# Patient Record
Sex: Female | Born: 1985
Health system: Southern US, Community
[De-identification: ages and names within clinical notes are randomized; demographics above are authoritative.]

## PROBLEM LIST (undated history)

## (undated) ENCOUNTER — Emergency Department (HOSPITAL_COMMUNITY): Payer: BC Managed Care – PPO

## (undated) DIAGNOSIS — R51 Headache: Secondary | ICD-10-CM

## (undated) DIAGNOSIS — I2699 Other pulmonary embolism without acute cor pulmonale: Secondary | ICD-10-CM

## (undated) DIAGNOSIS — G43909 Migraine, unspecified, not intractable, without status migrainosus: Secondary | ICD-10-CM

## (undated) DIAGNOSIS — IMO0002 Reserved for concepts with insufficient information to code with codable children: Secondary | ICD-10-CM

## (undated) DIAGNOSIS — E785 Hyperlipidemia, unspecified: Secondary | ICD-10-CM

## (undated) DIAGNOSIS — I82409 Acute embolism and thrombosis of unspecified deep veins of unspecified lower extremity: Secondary | ICD-10-CM

## (undated) DIAGNOSIS — F32A Depression, unspecified: Secondary | ICD-10-CM

## (undated) DIAGNOSIS — R519 Headache, unspecified: Secondary | ICD-10-CM

## (undated) DIAGNOSIS — F419 Anxiety disorder, unspecified: Secondary | ICD-10-CM

## (undated) DIAGNOSIS — K219 Gastro-esophageal reflux disease without esophagitis: Secondary | ICD-10-CM

## (undated) DIAGNOSIS — F329 Major depressive disorder, single episode, unspecified: Secondary | ICD-10-CM

## (undated) HISTORY — DX: Migraine, unspecified, not intractable, without status migrainosus: G43.909

## (undated) HISTORY — DX: Hyperlipidemia, unspecified: E78.5

## (undated) HISTORY — DX: Major depressive disorder, single episode, unspecified: F32.9

## (undated) HISTORY — DX: Reserved for concepts with insufficient information to code with codable children: IMO0002

## (undated) HISTORY — PX: OTHER SURGICAL HISTORY: SHX169

## (undated) HISTORY — DX: Gastro-esophageal reflux disease without esophagitis: K21.9

## (undated) HISTORY — DX: Anxiety disorder, unspecified: F41.9

## (undated) HISTORY — DX: Depression, unspecified: F32.A

## (undated) HISTORY — PX: TONSILLECTOMY AND ADENOIDECTOMY: SUR1326

## (undated) HISTORY — PX: EYE SURGERY: SHX253

---
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
## 2011-09-30 ENCOUNTER — Encounter: Payer: Self-pay | Admitting: *Deleted

## 2011-09-30 ENCOUNTER — Emergency Department (HOSPITAL_BASED_OUTPATIENT_CLINIC_OR_DEPARTMENT_OTHER)
Admission: EM | Admit: 2011-09-30 | Discharge: 2011-09-30 | Disposition: A | Discharge status: Home / Self Care | Payer: BC Managed Care – PPO | Attending: Emergency Medicine | Admitting: Emergency Medicine

## 2011-09-30 DIAGNOSIS — H53149 Visual discomfort, unspecified: Secondary | ICD-10-CM | POA: Insufficient documentation

## 2011-09-30 DIAGNOSIS — R51 Headache: Secondary | ICD-10-CM | POA: Insufficient documentation

## 2011-09-30 HISTORY — DX: Headache: R51

## 2011-09-30 HISTORY — DX: Headache, unspecified: R51.9

## 2011-09-30 MED ORDER — DIPHENHYDRAMINE HCL 50 MG/ML IJ SOLN
25.0000 mg | Freq: Once | INTRAMUSCULAR | Status: AC
  Administered 2011-09-30: 25 mg via INTRAMUSCULAR
  Filled 2011-09-30: qty 1

## 2011-09-30 MED ORDER — KETOROLAC TROMETHAMINE 60 MG/2ML IM SOLN
60.0000 mg | Freq: Once | INTRAMUSCULAR | Status: AC
  Administered 2011-09-30: 60 mg via INTRAMUSCULAR
  Filled 2011-09-30: qty 2

## 2011-09-30 MED ORDER — METOCLOPRAMIDE HCL 5 MG/ML IJ SOLN
10.0000 mg | Freq: Once | INTRAMUSCULAR | Status: AC
  Administered 2011-09-30: 10 mg via INTRAMUSCULAR
  Filled 2011-09-30: qty 2

## 2011-09-30 NOTE — ED Provider Notes (Signed)
History  Scribed for Stacy Baker, MD, the patient was seen in [facility]. The chart was scribed by Gilman Schmidt. The patients care was started at 8:30 PM. CSN: [id number] Arrival date & time: 09/30/2011  7:39 PM   First MD Initiated Contact with Patient 09/30/11 2028      Chief Complaint  Patient presents with  . Headache    HPI Stacy Wilkins is a 25 y.o. female who presents to the Emergency Department complaining of right sided dull headache. Pt states she has a hx of headaches and woke up with this one this a.m. Additionally notes having headache everyday this week. Pt has tried Excedrin with no relief. Also c/o pain to right eye and right side of neck. Sx are exacerbated in right eye by light. Denies other vomiting, fever, right eye drainage, or any cold like sx. States that symptoms are similar to previous.  Denies any other medical problems.There are no other associated symptoms and no other alleviating or aggravating factors. Denies fever, rash, meningeal signs   Past Medical History  Diagnosis Date  . Generalized headaches     History reviewed. No pertinent past surgical history.  History reviewed. No pertinent family history.  History  Substance Use Topics  . Smoking status: Never Smoker   . Smokeless tobacco: Not on file  . Alcohol Use: Yes    OB History    Grav Para Term Preterm Abortions TAB SAB Ect Mult Living                  Review of Systems  Constitutional: Negative for fever.  HENT: Negative for sore throat and sneezing.   Eyes: Positive for photophobia. Negative for discharge.  Gastrointestinal: Negative for vomiting.  Neurological: Positive for headaches. Negative for dizziness and weakness.  All other systems reviewed and are negative.     Allergies  Review of patient's allergies indicates no known allergies.  Home Medications   Current Outpatient Rx  Name Route Sig Dispense Refill  . ASPIRIN-ACETAMINOPHEN-CAFFEINE 250-250-65 MG PO TABS  Oral Take 2 tablets by mouth every 6 (six) hours as needed. For migraine     . LEVONORGESTREL-ETHINYL ESTRAD 0.15-30 MG-MCG PO TABS Oral Take 1 tablet by mouth daily.        BP 138/98  Pulse 100  Temp(Src) 98.1 F (36.7 C) (Oral)  Resp 18  Ht 5\' 4"  (1.626 m)  Wt 138 lb (62.596 kg)  BMI 23.69 kg/m2  SpO2 99%  LMP 09/30/2011  Physical Exam  Constitutional: She is oriented to person, place, and time. She appears well-developed and well-nourished.  Non-toxic appearance. She does not have a sickly appearance.  HENT:  Head: Normocephalic and atraumatic.  Eyes: Conjunctivae, EOM and lids are normal. Pupils are equal, round, and reactive to light. No scleral icterus.  Neck: Trachea normal and normal range of motion. Neck supple.  Cardiovascular: Regular rhythm and normal heart sounds.   Pulmonary/Chest: Effort normal and breath sounds normal.  Abdominal: Soft. Normal appearance. There is no tenderness. There is no rebound, no guarding and no CVA tenderness.  Musculoskeletal: Normal range of motion.  Neurological: She is alert and oriented to person, place, and time. She has normal strength.  Skin: Skin is warm, dry and intact. No rash noted.    ED Course  Procedures  DIAGNOSTIC STUDIES: Oxygen Saturation is 99% on room air, normal by my interpretation.    COORDINATION OF CARE: 8:30PM:  - Patient evaluated by ED physician, Reglan, Benadryl, Toradol  ordered     MDM  Pt given medications for her headache and now feels better. Repeat neurological exam remains stable we'll discharge to home  I personally performed the services described in this documentation, which was scribed in my presence. The recorded information has been reviewed and considered.         Stacy Baker, MD 09/30/11 2138

## 2011-09-30 NOTE — ED Notes (Signed)
Pt states she has a hx of headaches and woke up with this one this a.m. Also c/o pain to right eye and right side of neck. Denies other s/s.

## 2011-12-21 ENCOUNTER — Ambulatory Visit (INDEPENDENT_AMBULATORY_CARE_PROVIDER_SITE_OTHER): Payer: BC Managed Care – PPO

## 2011-12-21 DIAGNOSIS — J029 Acute pharyngitis, unspecified: Secondary | ICD-10-CM

## 2011-12-21 DIAGNOSIS — H6500 Acute serous otitis media, unspecified ear: Secondary | ICD-10-CM

## 2014-03-24 ENCOUNTER — Other Ambulatory Visit: Payer: Self-pay | Admitting: Otolaryngology

## 2014-08-05 ENCOUNTER — Ambulatory Visit: Payer: BC Managed Care – PPO | Admitting: Psychiatry

## 2014-09-11 ENCOUNTER — Other Ambulatory Visit: Payer: Self-pay | Admitting: Physician Assistant

## 2014-12-12 ENCOUNTER — Other Ambulatory Visit: Payer: Self-pay | Admitting: Physician Assistant

## 2014-12-13 NOTE — Telephone Encounter (Signed)
Most likely a patient at Dr [facility] office

## 2014-12-30 ENCOUNTER — Ambulatory Visit: Payer: Self-pay | Admitting: Podiatrist

## 2015-01-05 ENCOUNTER — Ambulatory Visit: Payer: Self-pay | Admitting: Podiatrist

## 2015-01-20 ENCOUNTER — Emergency Department (HOSPITAL_COMMUNITY)
Admission: EM | Admit: 2015-01-20 | Discharge: 2015-01-20 | Disposition: A | Discharge status: Home / Self Care | Payer: BLUE CROSS/BLUE SHIELD | Attending: Emergency Medicine | Admitting: Emergency Medicine

## 2015-01-20 ENCOUNTER — Encounter (HOSPITAL_COMMUNITY): Payer: Self-pay | Admitting: Emergency Medicine

## 2015-01-20 DIAGNOSIS — R519 Headache, unspecified: Secondary | ICD-10-CM

## 2015-01-20 DIAGNOSIS — G43909 Migraine, unspecified, not intractable, without status migrainosus: Secondary | ICD-10-CM | POA: Insufficient documentation

## 2015-01-20 DIAGNOSIS — R42 Dizziness and giddiness: Secondary | ICD-10-CM

## 2015-01-20 DIAGNOSIS — R51 Headache: Secondary | ICD-10-CM | POA: Diagnosis present

## 2015-01-20 DIAGNOSIS — R110 Nausea: Secondary | ICD-10-CM

## 2015-01-20 DIAGNOSIS — G8929 Other chronic pain: Secondary | ICD-10-CM | POA: Diagnosis not present

## 2015-01-20 DIAGNOSIS — Z7982 Long term (current) use of aspirin: Secondary | ICD-10-CM | POA: Insufficient documentation

## 2015-01-20 DIAGNOSIS — Z79899 Other long term (current) drug therapy: Secondary | ICD-10-CM | POA: Diagnosis not present

## 2015-01-20 MED ORDER — NAPROXEN 500 MG PO TABS: 500.0000 mg | ORAL_TABLET | Freq: Two times a day (BID) | ORAL | Status: DC | PRN

## 2015-01-20 MED ORDER — METOCLOPRAMIDE HCL 10 MG PO TABS: 10.0000 mg | ORAL_TABLET | Freq: Four times a day (QID) | ORAL | Status: DC | PRN

## 2015-01-20 MED ORDER — DIPHENHYDRAMINE HCL 25 MG PO TABS: 25.0000 mg | ORAL_TABLET | Freq: Four times a day (QID) | ORAL | Status: DC | PRN

## 2015-01-20 MED ORDER — SODIUM CHLORIDE 0.9 % IV BOLUS (SEPSIS)
1000.0000 mL | Freq: Once | INTRAVENOUS | Status: AC
  Administered 2015-01-20: 1000 mL via INTRAVENOUS

## 2015-01-20 MED ORDER — DIPHENHYDRAMINE HCL 50 MG/ML IJ SOLN
25.0000 mg | Freq: Once | INTRAMUSCULAR | Status: AC
  Administered 2015-01-20: 25 mg via INTRAVENOUS
  Filled 2015-01-20: qty 1

## 2015-01-20 MED ORDER — KETOROLAC TROMETHAMINE 30 MG/ML IJ SOLN
30.0000 mg | Freq: Once | INTRAMUSCULAR | Status: AC
  Administered 2015-01-20: 30 mg via INTRAVENOUS
  Filled 2015-01-20: qty 1

## 2015-01-20 MED ORDER — METOCLOPRAMIDE HCL 5 MG/ML IJ SOLN
10.0000 mg | Freq: Once | INTRAMUSCULAR | Status: AC
  Administered 2015-01-20: 10 mg via INTRAVENOUS
  Filled 2015-01-20: qty 2

## 2015-01-20 NOTE — ED Notes (Signed)
Pt c/o headache x6 days. Saw PCP on Tuesday and was given Tramadol with absolutely no alleviation of symptoms. Denies blurry vision or dizziness. Denies trouble speaking or numbness in arms/legs. C/o lightheadedness, nauseous and weakness. Emesis x1 yesterday -denies blood in vomit. No other c/c. Ambulatory. Speaking full/clear sentences. RR even/unlabored.

## 2015-01-20 NOTE — ED Provider Notes (Signed)
CSN: [id number]     Arrival date & time 01/20/15  1216 History   First MD Initiated Contact with Patient 01/20/15 1232     Chief Complaint  Patient presents with  . Migraine  . Headache     (Consider location/radiation/quality/duration/timing/severity/associated sxs/prior Treatment) HPI Comments: Stacy Wilkins is a 29 y.o. female with a PMHx of chronic headaches, who presents to the ED with complaints of 6 days of generalized headache. She reports that she has a 10/10 gradual onset generalized throbbing pain in her head, constant and nonradiating, unrelieved with tramadol, NSAIDs, and Excedrin, and worsened with "any activity" although she denies any worsening with neck movement. She reports that she has had headaches in the past but this is worse due to the fact that it is unrelenting and ongoing for 6 days. Additional symptoms reported include lightheadedness with standing, nausea, one episode of nonbloody nonbilious emesis yesterday but no ongoing vomiting today, bilateral ear pressure, and generalized fatigue. She saw her primary care doctor 2 days ago who prescribed her tramadol which again has not helped. She denies any fevers, chills, rhinorrhea, sore throat, ear discharge, URI symptoms, photophobia, phonophobia, vision changes, tinnitus, hearing loss, chest pain, shortness breath, cough, abdominal pain, ongoing vomiting, diarrhea, constipation, melena, hematochezia, vaginal bleeding or discharge, dysuria, hematuria, numbness, drooling, weakness, head injuries, loss of consciousness, syncope, neck stiffness, gait disturbances, confusion, recent tick bites or rashes, or sick contacts. LMP 01/12/15.   Patient is a 29 y.o. female presenting with migraines and headaches. The history is provided by the patient. No language interpreter was used.  Migraine This is a new problem. The current episode started in the past 7 days. The problem occurs constantly. The problem has been unchanged. Associated  symptoms include headaches. Pertinent negatives include no abdominal pain, arthralgias, chest pain, chills, congestion, coughing, fever, myalgias, nausea, neck pain, numbness, rash, sore throat, urinary symptoms, vertigo, visual change, vomiting or weakness. Exacerbated by: activity. She has tried NSAIDs and oral narcotics for the symptoms. The treatment provided no relief.  Headache Associated symptoms: ear pain   Associated symptoms: no abdominal pain, no back pain, no congestion, no cough, no diarrhea, no eye pain, no fever, no myalgias, no nausea, no neck pain, no neck stiffness, no numbness, no photophobia, no seizures, no sinus pressure, no sore throat, no visual change, no vomiting and no weakness     Past Medical History  Diagnosis Date  . Generalized headaches    History reviewed. No pertinent past surgical history. History reviewed. No pertinent family history. History  Substance Use Topics  . Smoking status: Never Smoker   . Smokeless tobacco: Not on file  . Alcohol Use: Yes   OB History    No data available     Review of Systems  Constitutional: Negative for fever and chills.  HENT: Positive for ear pain. Negative for congestion, ear discharge, rhinorrhea, sinus pressure, sore throat and trouble swallowing.   Eyes: Negative for photophobia, pain and visual disturbance.  Respiratory: Negative for cough and shortness of breath.   Cardiovascular: Negative for chest pain.  Gastrointestinal: Negative for nausea, vomiting, abdominal pain, diarrhea, constipation and blood in stool.  Genitourinary: Negative for dysuria, frequency, hematuria, flank pain, vaginal bleeding and vaginal discharge.  Musculoskeletal: Negative for myalgias, back pain, arthralgias, neck pain and neck stiffness.  Skin: Negative for color change and rash.  Neurological: Positive for light-headedness (with standing) and headaches. Negative for vertigo, seizures, syncope, speech difficulty, weakness and  numbness.  Psychiatric/Behavioral:  Negative for confusion.   10 Systems reviewed and are negative for acute change except as noted in the HPI.    Allergies  Review of patient's allergies indicates no known allergies.  Home Medications   Prior to Admission medications   Medication Sig Start Date End Date Taking? Authorizing Provider  aspirin-acetaminophen-caffeine (EXCEDRIN MIGRAINE) [phone] MG per tablet Take 2 tablets by mouth every 6 (six) hours as needed. For migraine     Historical Provider, MD  levonorgestrel-ethinyl estradiol (ALTAVERA) 0.15-30 MG-MCG tablet Take 1 tablet by mouth daily.      Historical Provider, MD   BP 124/76 mmHg  Pulse 80  Temp(Src) 98.1 F (36.7 C) (Oral)  Resp 16  Ht 5\' 5"  (1.651 m)  Wt 153 lb (69.4 kg)  BMI 25.46 kg/m2  SpO2 99%  LMP 01/12/2015 Physical Exam  Constitutional: She is oriented to person, place, and time. Vital signs are normal. She appears well-developed and well-nourished.  Non-toxic appearance. No distress.  Afebrile, nontoxic, NAD  HENT:  Head: Normocephalic and atraumatic.  Right Ear: Hearing, tympanic membrane, external ear and ear canal normal.  Left Ear: Hearing, tympanic membrane, external ear and ear canal normal.  Nose: Nose normal. Right sinus exhibits no maxillary sinus tenderness and no frontal sinus tenderness. Left sinus exhibits no maxillary sinus tenderness and no frontal sinus tenderness.  Mouth/Throat: Uvula is midline and oropharynx is clear and moist. Mucous membranes are dry.  Dry mucous membranes B/l sinuses nonTTP Nose clear Ears clear bilaterally  Eyes: Conjunctivae and EOM are normal. Pupils are equal, round, and reactive to light. Right eye exhibits no discharge. Left eye exhibits no discharge.  PERRL, EOMI, no nystagmus  Neck: Normal range of motion. Neck supple. No spinous process tenderness and no muscular tenderness present. No rigidity. Normal range of motion present.  FROM intact without spinous  process or paraspinous muscle TTP, no bony stepoffs or deformities, no muscle spasms. No rigidity or meningeal signs. No bruising or swelling.   Cardiovascular: Normal rate, regular rhythm, normal heart sounds and intact distal pulses.  Exam reveals no gallop and no friction rub.   No murmur heard. HR 80s when lying down, upon sitting HR jumps to 90s, then standing HR goes to 110s and as high as 120 indicating orthostasis.  At rest RRR, nl s1/s2, no m/r/g, distal pulses intact, no pedal edema   Pulmonary/Chest: Effort normal and breath sounds normal. No respiratory distress. She has no decreased breath sounds. She has no wheezes. She has no rhonchi. She has no rales.  Abdominal: Soft. Normal appearance and bowel sounds are normal. She exhibits no distension. There is no tenderness. There is no rigidity, no rebound, no guarding, no CVA tenderness, no tenderness at McBurney's point and negative Murphy's sign.  Musculoskeletal: Normal range of motion.  MAE x4 Strength and sensation grossly intact Distal pulses intact Gait steady All spinal levels with FROM intact without spinous process TTP, no bony stepoffs or deformities, no paraspinous muscle TTP or muscle spasms. No overlying skin changes.   Neurological: She is alert and oriented to person, place, and time. She has normal strength and normal reflexes. No cranial nerve deficit or sensory deficit. She displays a negative Romberg sign. Coordination and gait normal. GCS eye subscore is 4. GCS verbal subscore is 5. GCS motor subscore is 6.  CN 2-12 grossly intact A&O x4 GCS 15 Sensation and strength intact Gait nonataxic including with tandem walking Coordination with finger-to-nose WNL Neg romberg, neg pronator drift  DTRs symmetric  Skin: Skin is warm, dry and intact. No rash noted.  No rashes  Psychiatric: She has a normal mood and affect.  Nursing note and vitals reviewed.   ED Course  Procedures (including critical care time) Labs  Review Labs Reviewed - No data to display  Imaging Review No results found.   EKG Interpretation None      MDM   Final diagnoses:  Acute nonintractable headache, unspecified headache type  Nausea  Orthostatic lightheadedness    29 y.o. female with headache x6 days, no trauma. States it's not similar to prior headaches and that it's worse due to duration and not being relieved with oral meds, but pt with no red flag s/sx, nonfocal neuro exam, no meningismus, no abnormal vital signs. On exam she is found to be orthostatic (HR elevates to 110s/120 with going from sitting to standing) which would lend itself to dehydration as cause of lightheadedness. Will give migraine cocktail but doubt need for emergent imaging at this time. Will reassess shortly.   2:41 PM Pt reports huge improvement in symptoms. No longer nauseated, and headache decreased to 5/10 (from "11/10" at initial eval). Will PO challenge and d/c home with reglan/benadryl and naprosyn with PCP f/up in 1wk.   3:01 PM Tolerating PO well. Will discharge home with previously outlined plan. I explained the diagnosis and have given explicit precautions to return to the ER including for any other new or worsening symptoms. The patient understands and accepts the medical plan as it's been dictated and I have answered their questions. Discharge instructions concerning home care and prescriptions have been given. The patient is STABLE and is discharged to home in good condition.  BP 124/76 mmHg  Pulse 80  Temp(Src) 98.1 F (36.7 C) (Oral)  Resp 16  Ht  (1.651 m)  Wt 153 lb (69.4 kg)  BMI 25.46 kg/m2  SpO2 99%  LMP 01/12/2015  Meds ordered this encounter  Medications  . metoCLOPramide (REGLAN) injection 10 mg    Sig:    And  . diphenhydrAMINE (BENADRYL) injection 25 mg    Sig:    And  . sodium chloride 0.9 % bolus 1,000 mL    Sig:    And  . ketorolac (TORADOL) 30 MG/ML injection 30 mg    Sig:   . metoCLOPramide  (REGLAN) 10 MG tablet    Sig: Take 1 tablet (10 mg total) by mouth every 6 (six) hours as needed for nausea or vomiting (nausea/headache).    Dispense:  12 tablet    Refill:  0    Order Specific Question:  Supervising Provider    Answer:  Eber Hong D [[id number]]  . diphenhydrAMINE (BENADRYL) 25 MG tablet    Sig: Take 1 tablet (25 mg total) by mouth every 6 (six) hours as needed (headache or nausea (use with reglan)).    Dispense:  12 tablet    Refill:  0    Order Specific Question:  Supervising Provider    Answer:  Eber Hong D [[id number]]  . naproxen (NAPROSYN) 500 MG tablet    Sig: Take 1 tablet (500 mg total) by mouth 2 (two) times daily as needed for mild pain, moderate pain or headache (TAKE WITH MEALS.).    Dispense:  20 tablet    Refill:  0    Order Specific Question:  Supervising Provider    Answer:  Vida Roller [address] [place], PA-C 01/20/15 1502  Baxter Hire  N Ward, DO 01/20/15 1530

## 2015-01-20 NOTE — ED Notes (Signed)
PA at bedside.

## 2015-01-20 NOTE — Discharge Instructions (Signed)
Your headache and dizziness today responded well to hydration, reglan with benadryl, and an anti-inflammatory medication. Use reglan with benadryl as needed for headaches or nausea/vomiting, and use naprosyn or tylenol as needed for headaches. Stay very well hydrated as dehydration can cause headaches to worsen. Eat well balanced meals. See your regular doctor for recheck in 1 week. Return to the ER for changes or worsening symptoms.    Migraine Headache A migraine headache is very bad, throbbing pain on one or both sides of your head. Talk to your doctor about what things may bring on (trigger) your migraine headaches. HOME CARE  Only take medicines as told by your doctor.  Lie down in a dark, quiet room when you have a migraine.  Keep a journal to find out if certain things bring on migraine headaches. For example, write down:  What you eat and drink.  How much sleep you get.  Any change to your diet or medicines.  Lessen how much alcohol you drink.  Quit smoking if you smoke.  Get enough sleep.  Lessen any stress in your life.  Keep lights dim if bright lights bother you or make your migraines worse. GET HELP RIGHT AWAY IF:   Your migraine becomes really bad.  You have a fever.  You have a stiff neck.  You have trouble seeing.  Your muscles are weak, or you lose muscle control.  You lose your balance or have trouble walking.  You feel like you will pass out (faint), or you pass out.  You have really bad symptoms that are different than your first symptoms. MAKE SURE YOU:   Understand these instructions.  Will watch your condition.  Will get help right away if you are not doing well or get worse. Document Released: 08/21/2008 Document Revised: 02/04/2012 Document Reviewed: 07/20/2013 [facility] Patient Information 2015 [place], [place]. This information is not intended to replace advice given to you by your health care provider. Make sure you discuss any questions  you have with your health care provider.  Dizziness  Dizziness means you feel unsteady or lightheaded. You might feel like you are going to pass out (faint). HOME CARE   Drink enough fluids to keep your pee (urine) clear or pale yellow.  Take your medicines exactly as told by your doctor. If you take blood pressure medicine, always stand up slowly from the lying or sitting position. Hold on to something to steady yourself.  If you need to stand in one place for a long time, move your legs often. Tighten and relax your leg muscles.  Have someone stay with you until you feel okay.  Do not drive or use heavy machinery if you feel dizzy.  Do not drink alcohol. GET HELP RIGHT AWAY IF:   You feel dizzy or lightheaded and it gets worse.  You feel sick to your stomach (nauseous), or you throw up (vomit).  You have trouble talking or walking.  You feel weak or have trouble using your arms, hands, or legs.  You cannot think clearly or have trouble forming sentences.  You have chest pain, belly (abdominal) pain, sweating, or you are short of breath.  Your vision changes.  You are bleeding.  You have problems from your medicine that seem to be getting worse. MAKE SURE YOU:   Understand these instructions.  Will watch your condition.  Will get help right away if you are not doing well or get worse. Document Released: 11/01/2011 Document Revised: 02/04/2012 Document Reviewed:  11/01/2011 ExitCare Patient Information 2015 [place], [place]. This information is not intended to replace advice given to you by your health care provider. Make sure you discuss any questions you have with your health care provider.  Nausea, Adult Nausea is the feeling that you have an upset stomach or have to vomit. Nausea by itself is not likely a serious concern, but it may be an early sign of more serious medical problems. As nausea gets worse, it can lead to vomiting. If vomiting develops, there is the risk  of dehydration.  CAUSES   Viral infections.  Food poisoning.  Medicines.  Pregnancy.  Motion sickness.  Migraine headaches.  Emotional distress.  Severe pain from any source.  Alcohol intoxication. HOME CARE INSTRUCTIONS  Get plenty of rest.  Ask your caregiver about specific rehydration instructions.  Eat small amounts of food and sip liquids more often.  Take all medicines as told by your caregiver. SEEK MEDICAL CARE IF:  You have not improved after 2 days, or you get worse.  You have a headache. SEEK IMMEDIATE MEDICAL CARE IF:   You have a fever.  You faint.  You keep vomiting or have blood in your vomit.  You are extremely weak or dehydrated.  You have dark or bloody stools.  You have severe chest or abdominal pain. MAKE SURE YOU:  Understand these instructions.  Will watch your condition.  Will get help right away if you are not doing well or get worse. Document Released: 12/20/2004 Document Revised: 08/06/2012 Document Reviewed: 07/25/2011 [facility] Patient Information 2015 [place], [place]. This information is not intended to replace advice given to you by your health care provider. Make sure you discuss any questions you have with your health care provider.  Postural Orthostatic Tachycardia Syndrome Postural orthostatic tachycardia syndrome (POTS) is an increased heart rate when going from a lying (supine) position to a standing position. The heart rate may increase more than 30 beats per minute (BPM) above its resting rate when going from a lying to a standing position. POTS occurs more frequently in women than in men.  SYMPTOMS  POTS symptoms may be increased in the morning. Symptoms of POTS include:  Fainting or near fainting.  Inability to think clearly.  Extreme or chronic fatigue.  Exercise intolerance.  Chest pain.  Having the lower legs develop a reddish-blue color due to decreased blood flow (acrocyanosis). CAUSES POTS can be  caused by different conditions. Sometimes, it has no known cause (idiopathic). Some causes of POTS include:  Viral illness.  Pregnancy.  Autoimmune diseases.  Medications.  Major surgery.  Trauma such as a car accident or major injury.  Medical conditions such as anemia, dehydration, and hyperthyroidism. DIAGNOSIS  POTS is diagnosed by:  Taking a complete history and physical exam.  Measuring the heart rate while lying and then upon standing.  Measuring blood pressure when going from a lying to a standing position. POTS is usually not associated with low blood pressure (orthostatic hypotension) when going from a lying to standing position. While standing, blood pressure should be taken 2, 5, and 10 minutes after getting up. TREATMENT  Treatment of POTS depends upon the severity of the symptoms. Treatment includes:  Drinking plenty of fluids to avoid getting dehydrated.  Avoiding very hot environments to not get overheated.  Increasing your dietary salt intake as instructed by your caregiver.  Taking different types of medications as prescribed for POTS.  Avoiding some classes of medications such as vasodilators and diuretics. SEEK IMMEDIATE  MEDICAL CARE IF  You have severe chest pain that does not go away. Call your local emergency service immediately.  You feel your heart racing or beating rapidly.  You feel like passing out.  You have very confused thinking. MAKE SURE YOU  Understand these instructions.  Will watch your condition.  Will get help right away if you are not doing well or get worse. Document Released: 11/02/2002 Document Revised: 03/29/2014 Document Reviewed: 01/10/2011 [facility] Patient Information 2015 [place], [place]. This information is not intended to replace advice given to you by your health care provider. Make sure you discuss any questions you have with your health care provider.

## 2015-02-15 DIAGNOSIS — G43009 Migraine without aura, not intractable, without status migrainosus: Secondary | ICD-10-CM | POA: Insufficient documentation

## 2015-05-27 ENCOUNTER — Other Ambulatory Visit: Payer: Self-pay | Admitting: Physician Assistant

## 2015-09-29 ENCOUNTER — Other Ambulatory Visit (INDEPENDENT_AMBULATORY_CARE_PROVIDER_SITE_OTHER): Payer: BLUE CROSS/BLUE SHIELD

## 2015-09-29 ENCOUNTER — Encounter: Payer: Self-pay | Admitting: Internal Medicine

## 2015-09-29 ENCOUNTER — Ambulatory Visit (INDEPENDENT_AMBULATORY_CARE_PROVIDER_SITE_OTHER): Payer: BLUE CROSS/BLUE SHIELD | Admitting: Internal Medicine

## 2015-09-29 VITALS — BP 110/76 | HR 97 | Temp 99.1°F | Resp 12 | Ht 65.0 in | Wt 150.0 lb

## 2015-09-29 DIAGNOSIS — Z0000 Encounter for general adult medical examination without abnormal findings: Secondary | ICD-10-CM | POA: Insufficient documentation

## 2015-09-29 DIAGNOSIS — K649 Unspecified hemorrhoids: Secondary | ICD-10-CM

## 2015-09-29 LAB — COMPREHENSIVE METABOLIC PANEL
ALT: 14 U/L (ref 0–35)
AST: 15 U/L (ref 0–37)
Albumin: 4 g/dL (ref 3.5–5.2)
Alkaline Phosphatase: 41 U/L (ref 39–117)
BUN: 11 mg/dL (ref 6–23)
CO2: 29 mEq/L (ref 19–32)
Calcium: 9.5 mg/dL (ref 8.4–10.5)
Chloride: 103 mEq/L (ref 96–112)
Creatinine, Ser: 0.82 mg/dL (ref 0.40–1.20)
GFR: 87.69 mL/min (ref >60.00)
Glucose, Bld: 86 mg/dL (ref 70–99)
Potassium: 4.8 mEq/L (ref 3.5–5.1)
Sodium: 138 mEq/L (ref 135–145)
Total Bilirubin: 0.5 mg/dL (ref 0.2–1.2)
Total Protein: 6.8 g/dL (ref 6.0–8.3)

## 2015-09-29 LAB — CBC
HCT: 43.1 % (ref 36.0–46.0)
Hemoglobin: 14.5 g/dL (ref 12.0–15.0)
MCHC: 33.7 g/dL (ref 30.0–36.0)
MCV: 87.9 fl (ref 78.0–100.0)
Platelets: 204 10*3/uL (ref 150.0–400.0)
RBC: 4.9 Mil/uL (ref 3.87–5.11)
RDW: 11.9 % (ref 11.5–15.5)
WBC: 7.2 10*3/uL (ref 4.0–10.5)

## 2015-09-29 LAB — LIPID PANEL
Cholesterol: 183 mg/dL (ref 0–200)
HDL: 51.4 mg/dL (ref >39.00)
LDL Cholesterol: 116 mg/dL — ABNORMAL HIGH (ref 0–99)
NonHDL: 131.12
Total CHOL/HDL Ratio: 4
Triglycerides: 76 mg/dL (ref 0.0–149.0)
VLDL: 15.2 mg/dL (ref 0.0–40.0)

## 2015-09-29 LAB — HIV ANTIBODY (ROUTINE TESTING W REFLEX): HIV 1&2 Ab, 4th Generation: NONREACTIVE

## 2015-09-29 NOTE — Assessment & Plan Note (Signed)
Referral to GI per patient preference. She prefers exam by GI since she will need exam with them. Has tried and failed cream in the past. Still having pain. Talked to her about constipation prevention

## 2015-09-29 NOTE — Progress Notes (Signed)
Pre visit review using our clinic review tool, if applicable. No additional management support is needed unless otherwise documented below in the visit note. 

## 2015-09-29 NOTE — Patient Instructions (Signed)
We will get you to the GI doctor for the hemorrhoids. In the meantime think about adding more fiber to your diet to help with the constipation. Along with fiber make sure you are drinking 4-6 glasses of fluids per day.   Think about exercising 3-4 times per week as this is good for stress and worry.   High-Fiber Diet Fiber, also called dietary fiber, is a type of carbohydrate found in fruits, vegetables, whole grains, and beans. A high-fiber diet can have many health benefits. Your health care provider may recommend a high-fiber diet to help:  Prevent constipation. Fiber can make your bowel movements more regular.  Lower your cholesterol.  Relieve hemorrhoids, uncomplicated diverticulosis, or irritable bowel syndrome.  Prevent overeating as part of a weight-loss plan.  Prevent heart disease, type 2 diabetes, and certain cancers. WHAT IS MY PLAN? The recommended daily intake of fiber includes:  38 grams for men under age 35.  30 grams for men over age 34.  25 grams for women under age 77.  21 grams for women over age 2. You can get the recommended daily intake of dietary fiber by eating a variety of fruits, vegetables, grains, and beans. Your health care provider may also recommend a fiber supplement if it is not possible to get enough fiber through your diet. WHAT DO I NEED TO KNOW ABOUT A HIGH-FIBER DIET?  Fiber supplements have not been widely studied for their effectiveness, so it is better to get fiber through food sources.  Always check the fiber content on thenutrition facts label of any prepackaged food. Look for foods that contain at least 5 grams of fiber per serving.  Ask your dietitian if you have questions about specific foods that are related to your condition, especially if those foods are not listed in the following section.  Increase your daily fiber consumption gradually. Increasing your intake of dietary fiber too quickly may cause bloating, cramping, or  gas.  Drink plenty of water. Water helps you to digest fiber. WHAT FOODS CAN I EAT? Grains Whole-grain breads. Multigrain cereal. Oats and oatmeal. Brown rice. Barley. Bulgur wheat. Millet. Bran muffins. Popcorn. Rye wafer crackers. Vegetables Sweet potatoes. Spinach. Kale. Artichokes. Cabbage. Broccoli. Green peas. Carrots. Squash. Fruits Berries. Pears. Apples. Oranges. Avocados. Prunes and raisins. Dried figs. Meats and Other Protein Sources Navy, kidney, pinto, and soy beans. Split peas. Lentils. Nuts and seeds. Dairy Fiber-fortified yogurt. Beverages Fiber-fortified soy milk. Fiber-fortified orange juice. Other Fiber bars. The items listed above may not be a complete list of recommended foods or beverages. Contact your dietitian for more options. WHAT FOODS ARE NOT RECOMMENDED? Grains White bread. Pasta made with refined flour. White rice. Vegetables Fried potatoes. Canned vegetables. Well-cooked vegetables.  Fruits Fruit juice. Cooked, strained fruit. Meats and Other Protein Sources Fatty cuts of meat. Fried Environmental education officer or fried fish. Dairy Milk. Yogurt. Cream cheese. Sour cream. Beverages Soft drinks. Other Cakes and pastries. Butter and oils. The items listed above may not be a complete list of foods and beverages to avoid. Contact your dietitian for more information. WHAT ARE SOME TIPS FOR INCLUDING HIGH-FIBER FOODS IN MY DIET?  Eat a wide variety of high-fiber foods.  Make sure that half of all grains consumed each day are whole grains.  Replace breads and cereals made from refined flour or white flour with whole-grain breads and cereals.  Replace white rice with brown rice, bulgur wheat, or millet.  Start the day with a breakfast that is high in fiber,  such as a cereal that contains at least 5 grams of fiber per serving.  Use beans in place of meat in soups, salads, or pasta.  Eat high-fiber snacks, such as berries, raw vegetables, nuts, or popcorn.   This  information is not intended to replace advice given to you by your health care provider. Make sure you discuss any questions you have with your health care provider.   Document Released: 11/12/2005 Document Revised: 12/03/2014 Document Reviewed: 04/27/2014 Elsevier Interactive Patient Education [employer]2016 Elsevier Inc.

## 2015-09-29 NOTE — Progress Notes (Signed)
   Subjective:    Patient ID: Stacy Wilkins, female    DOB: Feb 04, 1986, 29 y.o.   MRN: [id number]  HPI The patient is a 29 YO female coming in new for hemorrhoids. She has had them for several years. Her previous doctor tried a cream on them which did not help. They are painful and occasionally gets drop of blood on the toilet paper. Has had PUD in the past and EGD many years ago. Not usually constipated but the last several weeks more constipated. Does not feel she has changed her diet. Does not eat great fiber.  [facility], social [facility] reviewed and updated.   Review of Systems  Constitutional: Negative for fever, activity change, appetite change, fatigue and unexpected weight change.  HENT: Negative.   Eyes: Negative.   Respiratory: Negative for cough, chest tightness, shortness of breath and wheezing.   Cardiovascular: Negative for chest pain, palpitations and leg swelling.  Gastrointestinal: Positive for constipation and rectal pain. Negative for nausea, vomiting, abdominal pain, diarrhea and abdominal distention.  Musculoskeletal: Negative.   Skin: Negative.   Neurological: Negative.   Psychiatric/Behavioral: Negative.       Objective:   Physical Exam  Constitutional: She is oriented to person, place, and time. She appears well-developed and well-nourished.  HENT:  Head: Normocephalic and atraumatic.  Eyes: EOM are normal.  Neck: Normal range of motion.  Cardiovascular: Normal rate and regular rhythm.   No murmur heard. Pulmonary/Chest: Effort normal and breath sounds normal. No respiratory distress. She has no wheezes. She has no rales.  Abdominal: Soft. Bowel sounds are normal. She exhibits no distension. There is no tenderness. There is no rebound.  Musculoskeletal: She exhibits no edema.  Neurological: She is alert and oriented to person, place, and time. Coordination normal.  Skin: Skin is warm and dry.  Psychiatric: She has a normal mood and affect.   Filed  Vitals:   09/29/15 0855  BP: 110/76  Pulse: 97  Temp: 99.1 F (37.3 C)  TempSrc: Oral  Resp: 12  Height: 5\' 5"  (1.651 m)  Weight: 150 lb (68.04 kg)  SpO2: 98%      Assessment & Plan:

## 2015-09-30 ENCOUNTER — Encounter: Payer: Self-pay | Admitting: Gastroenterology

## 2015-12-05 ENCOUNTER — Encounter: Payer: Self-pay | Admitting: Gastroenterology

## 2015-12-05 ENCOUNTER — Ambulatory Visit: Payer: BLUE CROSS/BLUE SHIELD | Admitting: Gastroenterology

## 2016-02-02 ENCOUNTER — Ambulatory Visit: Payer: BLUE CROSS/BLUE SHIELD | Admitting: Gastroenterology

## 2016-02-06 ENCOUNTER — Ambulatory Visit (INDEPENDENT_AMBULATORY_CARE_PROVIDER_SITE_OTHER): Payer: BLUE CROSS/BLUE SHIELD | Admitting: Urgent Care

## 2016-02-06 VITALS — BP 104/68 | HR 102 | Temp 98.7°F | Resp 16 | Ht 66.0 in | Wt 156.0 lb

## 2016-02-06 DIAGNOSIS — R000 Tachycardia, unspecified: Secondary | ICD-10-CM | POA: Diagnosis not present

## 2016-02-06 DIAGNOSIS — R42 Dizziness and giddiness: Secondary | ICD-10-CM

## 2016-02-06 LAB — POCT CBC
Granulocyte percent: 69.1 %G (ref 37–80)
HCT, POC: 38.5 % (ref 37.7–47.9)
Hemoglobin: 13.4 g/dL (ref 12.2–16.2)
Lymph, poc: 2.1 (ref 0.6–3.4)
MCH, POC: 30.5 pg (ref 27–31.2)
MCHC: 35 g/dL (ref 31.8–35.4)
MCV: 87.2 fL (ref 80–97)
MID (cbc): 0.7 (ref 0–0.9)
MPV: 8.2 fL (ref 0–99.8)
POC Granulocyte: 6.1 (ref 2–6.9)
POC LYMPH PERCENT: 23.1 %L (ref 10–50)
POC MID %: 7.8 %M (ref 0–12)
Platelet Count, POC: 178 10*3/uL (ref 142–424)
RBC: 4.41 M/uL (ref 4.04–5.48)
RDW, POC: 11.8 %
WBC: 8.9 10*3/uL (ref 4.6–10.2)

## 2016-02-06 LAB — COMPREHENSIVE METABOLIC PANEL
ALT: 31 U/L — ABNORMAL HIGH (ref 6–29)
AST: 14 U/L (ref 10–30)
Albumin: 4.1 g/dL (ref 3.6–5.1)
Alkaline Phosphatase: 48 U/L (ref 33–115)
BUN: 14 mg/dL (ref 7–25)
CO2: 27 mmol/L (ref 20–31)
Calcium: 9.2 mg/dL (ref 8.6–10.2)
Chloride: 101 mmol/L (ref 98–110)
Creat: 0.6 mg/dL (ref 0.50–1.10)
Glucose, Bld: 74 mg/dL (ref 65–99)
Potassium: 4.4 mmol/L (ref 3.5–5.3)
Sodium: 136 mmol/L (ref 135–146)
Total Bilirubin: 0.5 mg/dL (ref 0.2–1.2)
Total Protein: 6.3 g/dL (ref 6.1–8.1)

## 2016-02-06 LAB — TSH: TSH: 1.29 mIU/L

## 2016-02-06 LAB — POCT GLYCOSYLATED HEMOGLOBIN (HGB A1C): Hemoglobin A1C: 4.9

## 2016-02-06 MED ORDER — PROPRANOLOL HCL 10 MG PO TABS: 10.0000 mg | ORAL_TABLET | Freq: Two times a day (BID) | ORAL | Status: DC

## 2016-02-06 NOTE — Patient Instructions (Addendum)
IF you received an x-ray today, you will receive an invoice from [facility]. Please contact [facility] at [phone] with questions or concerns regarding your invoice.   IF you received labwork today, you will receive an invoice from [employer]. Please contact [facility] at [phone] with questions or concerns regarding your invoice.   Our billing staff will not be able to assist you with questions regarding bills from these companies.  You will be contacted with the lab results as soon as they are available. The fastest way to get your results is to activate your My Chart account. Instructions are located on the last page of this paperwork. If you have not heard from [place] regarding the results in 2 weeks, please contact this office.  Propranolol Start with 1 pill for the next 3 days. If you are not feeling any side effects and you still feel your heart is racing, then you can increase to 1 pill twice daily. Let me know if you remain symptomatic over the next 1-2 weeks.    Propranolol tablets What is this medicine? PROPRANOLOL (proe PRAN oh lole) is a beta-blocker. Beta-blockers reduce the workload on the heart and help it to beat more regularly. This medicine is used to treat high blood pressure, to control irregular heart rhythms (arrhythmias) and to relieve chest pain caused by angina. It may also be helpful after a heart attack. This medicine is also used to prevent migraine headaches, relieve uncontrollable shaking (tremors), and help certain problems related to the thyroid gland and adrenal gland. This medicine may be used for other purposes; ask your health care provider or pharmacist if you have questions. What should I tell my health care provider before I take this medicine? They need to know if you have any of these conditions: -circulation problems or blood vessel disease -diabetes -history of heart attack or heart disease, vasospastic  angina -kidney disease -liver disease -lung or breathing disease, like asthma or emphysema -pheochromocytoma -slow heart rate -thyroid disease -an unusual or allergic reaction to propranolol, other beta-blockers, medicines, foods, dyes, or preservatives -pregnant or trying to get pregnant -breast-feeding How should I use this medicine? Take this medicine by mouth with a glass of water. Follow the directions on the prescription label. Take your doses at regular intervals. Do not take your medicine more often than directed. Do not stop taking except on your the advice of your doctor or health care professional. Talk to your pediatrician regarding the use of this medicine in children. Special care may be needed. Overdosage: If you think you have taken too much of this medicine contact a poison control center or emergency room at once. NOTE: This medicine is only for you. Do not share this medicine with others. What if I miss a dose? If you miss a dose, take it as soon as you can. If it is almost time for your next dose, take only that dose. Do not take double or extra doses. What may interact with this medicine? Do not take this medicine with any of the following medications: -feverfew -phenothiazines like chlorpromazine, mesoridazine, prochlorperazine, thioridazine This medicine may also interact with the following medications: -aluminum hydroxide gel -antipyrine -antiviral medicines for HIV or AIDS -barbiturates like phenobarbital -certain medicines for blood pressure, heart disease, irregular heart beat -cimetidine -ciprofloxacin -diazepam -fluconazole -haloperidol -isoniazid -medicines for cholesterol like cholestyramine or colestipol -medicines for mental depression -medicines for migraine headache like almotriptan, eletriptan, frovatriptan, naratriptan, rizatriptan, sumatriptan, zolmitriptan -NSAIDs, medicines for pain  and inflammation, like ibuprofen or  naproxen -phenytoin -rifampin -teniposide -theophylline -thyroid medicines -tolbutamide -warfarin -zileuton This list may not describe all possible interactions. Give your health care provider a list of all the medicines, herbs, non-prescription drugs, or dietary supplements you use. Also tell them if you smoke, drink alcohol, or use illegal drugs. Some items may interact with your medicine. What should I watch for while using this medicine? Visit your doctor or health care professional for regular check ups. Check your blood pressure and pulse rate regularly. Ask your health care professional what your blood pressure and pulse rate should be, and when you should contact them. You may get drowsy or dizzy. Do not drive, use machinery, or do anything that needs mental alertness until you know how this drug affects you. Do not stand or sit up quickly, especially if you are an older patient. This reduces the risk of dizzy or fainting spells. Alcohol can make you more drowsy and dizzy. Avoid alcoholic drinks. This medicine can affect blood sugar levels. If you have diabetes, check with your doctor or health care professional before you change your diet or the dose of your diabetic medicine. Do not treat yourself for coughs, colds, or pain while you are taking this medicine without asking your doctor or health care professional for advice. Some ingredients may increase your blood pressure. What side effects may I notice from receiving this medicine? Side effects that you should report to your doctor or health care professional as soon as possible: -allergic reactions like skin rash, itching or hives, swelling of the face, lips, or tongue -breathing problems -changes in blood sugar -cold hands or feet -difficulty sleeping, nightmares -dry peeling skin -hallucinations -muscle cramps or weakness -slow heart rate -swelling of the legs and ankles -vomiting Side effects that usually do not require  medical attention (report to your doctor or health care professional if they continue or are bothersome): -change in sex drive or performance -diarrhea -dry sore eyes -hair loss -nausea -weak or tired This list may not describe all possible side effects. Call your doctor for medical advice about side effects. You may report side effects to FDA at 1-800-FDA-1088. Where should I keep my medicine? Keep out of the reach of children. Store at room temperature between 15 and 30 degrees C (59 and 86 degrees F). Protect from light. Throw away any unused medicine after the expiration date. NOTE: This sheet is a summary. It may not cover all possible information. If you have questions about this medicine, talk to your doctor, pharmacist, or health care provider.    2016, Elsevier/Gold Standard. (2013-07-17 14:51:53)   Nonspecific Tachycardia Tachycardia is a faster than normal heartbeat (more than 100 beats per minute). In adults, the heart normally beats between 60 and 100 times a minute. A fast heartbeat may be a normal response to exercise or stress. It does not necessarily mean that something is wrong. However, sometimes when your heart beats too fast it may not be able to pump enough blood to the rest of your body. This can result in chest pain, shortness of breath, dizziness, and even fainting. Nonspecific tachycardia means that the specific cause or pattern of your tachycardia is unknown. CAUSES  Tachycardia may be harmless or it may be due to a more serious underlying cause. Possible causes of tachycardia include:  Exercise or exertion.  Fever.  Pain or injury.  Infection.  Loss of body fluids (dehydration).  Overactive thyroid.  Lack of red blood cells (  anemia).  Anxiety and stress.  Alcohol.  Caffeine.  Tobacco products.  Diet pills.  Illegal drugs.  Heart disease. SYMPTOMS  Rapid or irregular heartbeat (palpitations).  Suddenly feeling your heart beating (cardiac  awareness).  Dizziness.  Tiredness (fatigue).  Shortness of breath.  Chest pain.  Nausea.  Fainting. DIAGNOSIS  Your caregiver will perform a physical exam and take your medical history. In some cases, a heart specialist (cardiologist) may be consulted. Your caregiver may also order:  Blood tests.  Electrocardiography. This test records the electrical activity of your heart.  A heart monitoring test. TREATMENT  Treatment will depend on the likely cause of your tachycardia. The goal is to treat the underlying cause of your tachycardia. Treatment methods may include:  Replacement of fluids or blood through an intravenous (IV) tube for moderate to severe dehydration or anemia.  New medicines or changes in your current medicines.  Diet and lifestyle changes.  Treatment for certain infections.  Stress relief or relaxation methods. HOME CARE INSTRUCTIONS   Rest.  Drink enough fluids to keep your urine clear or pale yellow.  Do not smoke.  Avoid:  Caffeine.  Tobacco.  Alcohol.  Chocolate.  Stimulants such as over-the-counter diet pills or pills that help you stay awake.  Situations that cause anxiety or stress.  Illegal drugs such as marijuana, phencyclidine (PCP), and cocaine.  Only take medicine as directed by your caregiver.  Keep all follow-up appointments as directed by your caregiver. SEEK IMMEDIATE MEDICAL CARE IF:   You have pain in your chest, upper arms, jaw, or neck.  You become weak, dizzy, or feel faint.  You have palpitations that will not go away.  You vomit, have diarrhea, or pass blood in your stool.  Your skin is cool, pale, and wet.  You have a fever that will not go away with rest, fluids, and medicine. MAKE SURE YOU:   Understand these instructions.  Will watch your condition.  Will get help right away if you are not doing well or get worse.   This information is not intended to replace advice given to you by your health  care provider. Make sure you discuss any questions you have with your health care provider.   Document Released: 12/20/2004 Document Revised: 02/04/2012 Document Reviewed: 05/27/2015 Elsevier Interactive Patient Education [employer]

## 2016-02-06 NOTE — Progress Notes (Signed)
    MRN: [id number] DOB: 11-May-1986  Subjective:   Stacy Wilkins is a 30 y.o. female presenting for chief complaint of Palpitations; Dizziness; and Nausea  Reports 1 day history of heart racing, dizziness, shob, headaches, nausea, difficulty sleeping in the past couple of weeks. 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 Patient eats regular meals, drinks 2-3L of water daily. Does not exercise. Admits that she worries very easily, can become overwhelmed but denies panic attacks. Denies chest pain, palpitations, abdominal pain, dysuria, syncope. Denies depression, shakiness, sweating, fearfulness. Denies smoking cigarettes or drinking alcohol.  Stacy Wilkins has a current medication list which includes the following prescription(s): junel fe 1/20, multivitamin with minerals, and valacyclovir. Also is allergic to esomeprazole magnesium.  Stacy Wilkins  has a past medical history of Generalized headaches; Depression; Hyperlipidemia; GERD (gastroesophageal reflux disease); and Migraine. Also  has past surgical history that includes Tonsillectomy and adenoidectomy and Eye surgery.  Her family history includes Cancer in her brother and father; Diabetes in her maternal uncle; Hypertension in her father and mother.  Objective:   Vitals: BP 104/68 mmHg  Pulse 102  Temp(Src) 98.7 F (37.1 C)  Resp 16  Ht 5\' 6"  (1.676 m)  Wt 156 lb (70.761 kg)  BMI 25.19 kg/m2  SpO2 98%  LMP 01/28/2016 (Approximate)  Physical Exam  Constitutional: She is oriented to person, place, and time. She appears well-developed and well-nourished.  HENT:  Mouth/Throat: Oropharynx is clear and moist.  Eyes: Right eye exhibits no discharge. Left eye exhibits no discharge. No scleral icterus.  Neck: Normal range of motion. Neck supple. No thyromegaly present.  Cardiovascular: Normal rate, regular rhythm and intact distal pulses.  Exam reveals no gallop and no friction rub.   No murmur  heard. Pulmonary/Chest: No respiratory distress. She has no wheezes. She has no rales.  Musculoskeletal: She exhibits no edema.  Neurological: She is alert and oriented to person, place, and time.  Skin: Skin is warm and dry.    Results for orders placed or performed in visit on 02/06/16 (from the past 24 hour(s))  POCT CBC     Status: None   Collection Time: 02/06/16  2:19 PM  Result Value Ref Range   WBC 8.9 4.6 - 10.2 K/uL   Lymph, poc 2.1 0.6 - 3.4   POC LYMPH PERCENT 23.1 10 - 50 %L   MID (cbc) 0.7 0 - 0.9   POC MID % 7.8 0 - 12 %M   POC Granulocyte 6.1 2 - 6.9   Granulocyte percent 69.1 37 - 80 %G   RBC 4.41 4.04 - 5.48 M/uL   Hemoglobin 13.4 12.2 - 16.2 g/dL   HCT, POC 40.938.5 81.137.7 - 47.9 %   MCV 87.2 80 - 97 fL   MCH, POC 30.5 27 - 31.2 pg   MCHC 35.0 31.8 - 35.4 g/dL   RDW, POC 91.411.8 %   Platelet Count, POC 178 142 - 424 K/uL   MPV 8.2 0 - 99.8 fL  POCT glycosylated hemoglobin (Hb A1C)     Status: None   Collection Time: 02/06/16  2:19 PM  Result Value Ref Range   Hemoglobin A1C 4.9    Assessment and Plan :   1. Dizziness 2. Racing heart beat - Labs pending, consider arrhythmia versus anxiety. Will f/u with labs. Patient agreed to start propranolol 10mg  BID.  Wallis BambergMario Mani, PA-C [facility] [phone] 02/06/2016 2:02 PM

## 2016-02-08 ENCOUNTER — Other Ambulatory Visit: Payer: Self-pay | Admitting: Urgent Care

## 2016-02-08 DIAGNOSIS — R42 Dizziness and giddiness: Secondary | ICD-10-CM

## 2016-02-08 DIAGNOSIS — R002 Palpitations: Secondary | ICD-10-CM

## 2016-02-08 DIAGNOSIS — R000 Tachycardia, unspecified: Secondary | ICD-10-CM

## 2016-02-10 ENCOUNTER — Telehealth: Payer: Self-pay | Admitting: *Deleted

## 2016-02-10 NOTE — Telephone Encounter (Signed)
Pt called about lab work.  Please see labs.  It looks like Stacy Wilkins talked to her already.  What questions do she have?  Lmom to cb

## 2016-02-27 DIAGNOSIS — R002 Palpitations: Secondary | ICD-10-CM | POA: Insufficient documentation

## 2016-02-27 DIAGNOSIS — H6692 Otitis media, unspecified, left ear: Secondary | ICD-10-CM | POA: Diagnosis not present

## 2016-02-27 DIAGNOSIS — L559 Sunburn, unspecified: Secondary | ICD-10-CM | POA: Diagnosis not present

## 2016-03-01 ENCOUNTER — Ambulatory Visit: Payer: BLUE CROSS/BLUE SHIELD | Admitting: Cardiovascular Disease

## 2016-03-25 NOTE — Progress Notes (Signed)
This encounter was created in error - please disregard.

## 2016-03-26 ENCOUNTER — Encounter: Payer: BLUE CROSS/BLUE SHIELD | Admitting: Cardiovascular Disease

## 2016-03-26 ENCOUNTER — Encounter: Payer: Self-pay | Admitting: *Deleted

## 2016-03-26 DIAGNOSIS — R109 Unspecified abdominal pain: Secondary | ICD-10-CM | POA: Diagnosis not present

## 2016-03-26 DIAGNOSIS — H6992 Unspecified Eustachian tube disorder, left ear: Secondary | ICD-10-CM | POA: Diagnosis not present

## 2016-03-26 DIAGNOSIS — R110 Nausea: Secondary | ICD-10-CM | POA: Diagnosis not present

## 2016-03-30 ENCOUNTER — Encounter: Payer: Self-pay | Admitting: Cardiovascular Disease

## 2016-04-12 ENCOUNTER — Ambulatory Visit: Payer: BLUE CROSS/BLUE SHIELD | Admitting: Gastroenterology

## 2016-05-10 DIAGNOSIS — L858 Other specified epidermal thickening: Secondary | ICD-10-CM | POA: Diagnosis not present

## 2016-05-10 DIAGNOSIS — L719 Rosacea, unspecified: Secondary | ICD-10-CM | POA: Diagnosis not present

## 2016-05-31 DIAGNOSIS — F411 Generalized anxiety disorder: Secondary | ICD-10-CM | POA: Diagnosis not present

## 2016-06-11 DIAGNOSIS — Z6826 Body mass index (BMI) 26.0-26.9, adult: Secondary | ICD-10-CM | POA: Diagnosis not present

## 2016-06-11 DIAGNOSIS — Z01419 Encounter for gynecological examination (general) (routine) without abnormal findings: Secondary | ICD-10-CM | POA: Diagnosis not present

## 2016-06-14 DIAGNOSIS — H93292 Other abnormal auditory perceptions, left ear: Secondary | ICD-10-CM | POA: Diagnosis not present

## 2016-06-14 DIAGNOSIS — H9312 Tinnitus, left ear: Secondary | ICD-10-CM | POA: Diagnosis not present

## 2016-06-14 DIAGNOSIS — H9202 Otalgia, left ear: Secondary | ICD-10-CM | POA: Insufficient documentation

## 2016-06-14 DIAGNOSIS — H9313 Tinnitus, bilateral: Secondary | ICD-10-CM | POA: Insufficient documentation

## 2016-06-14 DIAGNOSIS — H93293 Other abnormal auditory perceptions, bilateral: Secondary | ICD-10-CM | POA: Diagnosis not present

## 2016-06-14 DIAGNOSIS — H6983 Other specified disorders of Eustachian tube, bilateral: Secondary | ICD-10-CM | POA: Insufficient documentation

## 2016-06-14 DIAGNOSIS — N644 Mastodynia: Secondary | ICD-10-CM | POA: Diagnosis not present

## 2016-06-18 ENCOUNTER — Ambulatory Visit: Payer: BLUE CROSS/BLUE SHIELD | Admitting: Gastroenterology

## 2016-06-18 DIAGNOSIS — F411 Generalized anxiety disorder: Secondary | ICD-10-CM | POA: Diagnosis not present

## 2016-07-05 DIAGNOSIS — K649 Unspecified hemorrhoids: Secondary | ICD-10-CM | POA: Diagnosis not present

## 2016-07-05 DIAGNOSIS — R1013 Epigastric pain: Secondary | ICD-10-CM | POA: Diagnosis not present

## 2016-07-05 DIAGNOSIS — K5904 Chronic idiopathic constipation: Secondary | ICD-10-CM | POA: Diagnosis not present

## 2016-07-13 ENCOUNTER — Ambulatory Visit: Payer: Self-pay | Admitting: Cardiovascular Disease

## 2016-07-16 ENCOUNTER — Encounter: Payer: Self-pay | Admitting: Sports Medicine

## 2016-07-16 ENCOUNTER — Ambulatory Visit (INDEPENDENT_AMBULATORY_CARE_PROVIDER_SITE_OTHER): Payer: BLUE CROSS/BLUE SHIELD | Admitting: Sports Medicine

## 2016-07-16 ENCOUNTER — Ambulatory Visit (INDEPENDENT_AMBULATORY_CARE_PROVIDER_SITE_OTHER): Payer: BLUE CROSS/BLUE SHIELD

## 2016-07-16 DIAGNOSIS — M21619 Bunion of unspecified foot: Secondary | ICD-10-CM | POA: Diagnosis not present

## 2016-07-16 DIAGNOSIS — M79673 Pain in unspecified foot: Secondary | ICD-10-CM

## 2016-07-16 NOTE — Patient Instructions (Signed)
Pre-Operative Instructions  Congratulations, you have decided to take an important step to improving your quality of life.  You can be assured that the doctors of [facility] will be with you every step of the way.  1. Plan to be at the surgery center/hospital at least 1 (one) hour prior to your scheduled time unless otherwise directed by the surgical center/hospital staff.  You must have a responsible adult accompany you, remain during the surgery and drive you home.  Make sure you have directions to the surgical center/hospital and know how to get there on time. 2. For hospital based surgery you will need to obtain a history and physical form from your family physician within 1 month prior to the date of surgery- we will give you a form for you primary physician.  3. We make every effort to accommodate the date you request for surgery.  There are however, times where surgery dates or times have to be moved.  We will contact you as soon as possible if a change in schedule is required.   4. No Aspirin/Ibuprofen for one week before surgery.  If you are on aspirin, any non-steroidal anti-inflammatory medications (Mobic, Aleve, Ibuprofen) you should stop taking it 7 days prior to your surgery.  You make take Tylenol  For pain prior to surgery.  5. Medications- If you are taking daily heart and blood pressure medications, seizure, reflux, allergy, asthma, anxiety, pain or diabetes medications, make sure the surgery center/hospital is aware before the day of surgery so they may notify you which medications to take or avoid the day of surgery. 6. No food or drink after midnight the night before surgery unless directed otherwise by surgical center/hospital staff. 7. No alcoholic beverages 24 hours prior to surgery.  No smoking 24 hours prior to or 24 hours after surgery. 8. Wear loose pants or shorts- loose enough to fit over bandages, boots, and casts. 9. No slip on shoes, sneakers are best. 10. Bring  your boot with you to the surgery center/hospital.  Also bring crutches or a walker if your physician has prescribed it for you.  If you do not have this equipment, it will be provided for you after surgery. 11. If you have not been contracted by the surgery center/hospital by the day before your surgery, call to confirm the date and time of your surgery. 12. Leave-time from work may vary depending on the type of surgery you have.  Appropriate arrangements should be made prior to surgery with your employer. 13. Prescriptions will be provided immediately following surgery by your doctor.  Have these filled as soon as possible after surgery and take the medication as directed. 14. Remove nail polish on the operative foot. 15. Wash the night before surgery.  The night before surgery wash the foot and leg well with the antibacterial soap provided and water paying special attention to beneath the toenails and in between the toes.  Rinse thoroughly with water and dry well with a towel.  Perform this wash unless told not to do so by your physician.  Enclosed: 1 Ice pack (please put in freezer the night before surgery)   1 Hibiclens skin cleaner   Pre-op Instructions  If you have any questions regarding the instructions, do not hesitate to call our office.  [place]: [address] [place], [place] [address] [phone]  [place]: [address] [place], [place] [address] [phone]  [place]: [address]  [place], [place] [address] [phone]   Dr.   Norman Regal DPM, Dr. Matthew Wagoner DPM, Dr. M. Todd Hyatt DPM, Dr. Charnee Turnipseed DPM 

## 2016-07-16 NOTE — Progress Notes (Signed)
Subjective: Stacy Wilkins is a 30 y.o. female patient who presents to office for evaluation of Right> Left bunion pain. Patient complains of progressive pain especially over the last year in the Right>Left foot that starts as pain over the bump with direct pressure and range of motion; patient now has difficulty fitting shoes comfortably. Pain is now interferring with daily activities.  Patient has also tried change in shoes. Admits to family history of bunions. Patient denies any other pedal complaints.   Patient Active Problem List   Diagnosis Date Noted  . Dysfunction of both Eustachian tubes 06/14/2016  . Referred otalgia of left ear 06/14/2016  . Tinnitus of both ears 06/14/2016  . Palpitations 02/27/2016  . Hemorrhoids 09/29/2015  . Routine general medical examination at a health care facility 09/29/2015  . Encounter for general adult medical examination without abnormal findings 09/29/2015  . Migraine without aura and responsive to treatment 02/15/2015  . Migraine without aura and without status migrainosus, not intractable 02/15/2015    Current Outpatient Prescriptions on File Prior to Visit  Medication Sig Dispense Refill  . JUNEL FE 1/20 1-20 MG-MCG tablet Take 1 tablet by mouth daily.   6  . Multiple Vitamins-Minerals (MULTIVITAMIN WITH MINERALS) tablet Take 1 tablet by mouth daily.    . propranolol (INDERAL) 10 MG tablet Take 1 tablet (10 mg total) by mouth 2 (two) times daily. 30 tablet 1  . valACYclovir (VALTREX) 500 MG tablet Take 500 mg by mouth daily as needed.   6   No current facility-administered medications on file prior to visit.     Allergies  Allergen Reactions  . Esomeprazole Magnesium Other (See Comments)    Other Other reaction(s): Unknown   Past Surgical History:  Procedure Laterality Date  . EYE SURGERY    . TONSILLECTOMY AND ADENOIDECTOMY     Family History  Problem Relation Age of Onset  . Hypertension Mother   . Cancer Father   .  Hypertension Father   . Cancer Brother   . Diabetes Maternal Uncle    Social History   Social History  . Marital status: Single    Spouse name: N/A  . Number of children: N/A  . Years of education: N/A   Occupational History  . Not on file.   Social History Main Topics  . Smoking status: Former Games developermoker  . Smokeless tobacco: Not on file  . Alcohol use 0.0 oz/week  . Drug use: No  . Sexual activity: Yes    Birth control/ protection: Pill   Other Topics Concern  . Not on file   Social History Narrative  . No narrative on file   Objective:  General: Alert and oriented x3 in no acute distress  Dermatology: No open lesions bilateral lower extremities, no webspace macerations, no ecchymosis bilateral, all nails x 10 are well manicured.  Vascular: Dorsalis Pedis and Posterior Tibial pedal pulses 2/4, Capillary Fill Time 3 seconds, (+) pedal hair growth bilateral, no edema bilateral lower extremities, Temperature gradient within normal limits.  Neurology: Gross sensation intact via light touch bilateral,(-) Tinels sign right and left foot.   Musculoskeletal: Mild tenderness with palpation right>left bunion deformity, no limitation or crepitus with range of motion, deformity reducible, tracking not trackbound, there is no 1st ray hypermobility noted bilateral. Midtarsal, Subtalar joint, and ankle joint range of motion is within normal limits. Strength within normal limits.    Xrays  Right and Left Foot    Impression: Normal osseous mineralization, Intermetatarsal angle  above normal limits supportive of bunion R>L, no other acute findings.       Assessment and Plan: Problem List Items Addressed This Visit    None    Visit Diagnoses    Bunion    -  Primary   Foot pain, unspecified laterality       Relevant Orders   DG Foot 2 Views Left   DG Foot 2 Views Right       -Complete examination performed -Xrays reviewed -Discussed treatement options; discussed HAV  deformity;conservative and  Surgical management; risks, benefits, alternatives discussed. All patient's questions answered. -Patient declined continued conservative care -Patient opt for surgical management. Consent obtained for Right Bunionectomy with screw (long arm/plantarflexory osteotomy). Pre and Post op course explained. Risks, benefits, alternatives explained. No guarantees given or implied. Surgical booking slip submitted and provided patient with Surgical packet and info for [facility]. -Dispensed CAM Walker to use post op -Recommend 8-12 weeks no work/leave time. -Meanwhile, Recommend continue with good supportive shoes and inserts.  -Patient to return to office after surgery or sooner if condition worsens.  Asencion Islamitorya Stover, DPM

## 2016-07-27 DIAGNOSIS — F411 Generalized anxiety disorder: Secondary | ICD-10-CM | POA: Diagnosis not present

## 2016-08-07 ENCOUNTER — Ambulatory Visit: Payer: BLUE CROSS/BLUE SHIELD | Admitting: Sports Medicine

## 2016-08-22 DIAGNOSIS — F411 Generalized anxiety disorder: Secondary | ICD-10-CM | POA: Diagnosis not present

## 2016-08-23 ENCOUNTER — Ambulatory Visit: Payer: Self-pay | Admitting: Cardiovascular Disease

## 2016-08-24 ENCOUNTER — Encounter: Payer: Self-pay | Admitting: *Deleted

## 2016-08-29 DIAGNOSIS — R000 Tachycardia, unspecified: Secondary | ICD-10-CM | POA: Diagnosis not present

## 2016-08-29 DIAGNOSIS — R0789 Other chest pain: Secondary | ICD-10-CM | POA: Diagnosis not present

## 2016-08-30 ENCOUNTER — Ambulatory Visit (HOSPITAL_COMMUNITY)
Admission: RE | Admit: 2016-08-30 | Discharge: 2016-08-30 | Disposition: A | Discharge status: Home / Self Care | Payer: BLUE CROSS/BLUE SHIELD | Source: Ambulatory Visit | Attending: Family Medicine | Admitting: Family Medicine

## 2016-08-30 ENCOUNTER — Other Ambulatory Visit (HOSPITAL_COMMUNITY): Payer: Self-pay | Admitting: Family Medicine

## 2016-08-30 DIAGNOSIS — R110 Nausea: Secondary | ICD-10-CM

## 2016-08-30 DIAGNOSIS — R109 Unspecified abdominal pain: Secondary | ICD-10-CM

## 2016-08-30 DIAGNOSIS — R252 Cramp and spasm: Secondary | ICD-10-CM | POA: Diagnosis not present

## 2016-08-30 DIAGNOSIS — R1013 Epigastric pain: Secondary | ICD-10-CM | POA: Diagnosis not present

## 2016-08-30 DIAGNOSIS — K219 Gastro-esophageal reflux disease without esophagitis: Secondary | ICD-10-CM | POA: Diagnosis not present

## 2016-09-04 DIAGNOSIS — R110 Nausea: Secondary | ICD-10-CM | POA: Diagnosis not present

## 2016-09-04 DIAGNOSIS — R1013 Epigastric pain: Secondary | ICD-10-CM | POA: Diagnosis not present

## 2016-09-05 DIAGNOSIS — K259 Gastric ulcer, unspecified as acute or chronic, without hemorrhage or perforation: Secondary | ICD-10-CM | POA: Diagnosis not present

## 2016-09-05 DIAGNOSIS — Z0000 Encounter for general adult medical examination without abnormal findings: Secondary | ICD-10-CM | POA: Diagnosis not present

## 2016-09-05 DIAGNOSIS — R1013 Epigastric pain: Secondary | ICD-10-CM | POA: Diagnosis not present

## 2016-09-05 DIAGNOSIS — K253 Acute gastric ulcer without hemorrhage or perforation: Secondary | ICD-10-CM | POA: Diagnosis not present

## 2016-09-12 DIAGNOSIS — L719 Rosacea, unspecified: Secondary | ICD-10-CM | POA: Diagnosis not present

## 2016-09-12 DIAGNOSIS — L858 Other specified epidermal thickening: Secondary | ICD-10-CM | POA: Diagnosis not present

## 2016-09-19 DIAGNOSIS — F411 Generalized anxiety disorder: Secondary | ICD-10-CM | POA: Diagnosis not present

## 2016-09-20 ENCOUNTER — Telehealth: Payer: Self-pay | Admitting: *Deleted

## 2016-09-20 NOTE — Telephone Encounter (Signed)
"  I have a couple of questions I need to ask maybe you can answer them for me.  Does she ever do surgery for a bunionectomy on both feet at the same time?"  She does not recommend having both feet done at the same time.  "How soon after first surgery would I be able to have the other foot done?"  You can have the other foot done about 3 weeks later.  "Okay that is good.  How soon can I schedule?"  She can do it on November 13.  "You know I better wait until my supervisor gets off maternity leave.  Let us do it in January.  What is her first available date?"  She can do it on January 8th.  "That will be great."  Someone from the surgical center will call you with the arrival time the Friday before surgery date.  Remember not to eat or drink the night before and someone will have to go with you to surgical center because you will be under a local / IV sedation.

## 2016-10-02 NOTE — Progress Notes (Deleted)
Cardiology Office Note   Date:  10/02/2016   ID:  Stacy Wilkins, DOB 07/06/1986, MRN [id number]  PCP:  [facility]  Cardiologist:   Chilton Siiffany Warwick, MD   No chief complaint on file.     History of Present Illness: Stacy Wilkins is a 30 y.o. female with hyperlipidemia and GERD who presents for an evaluation of palpitations.  Stacy Wilkins was seen by Wallis BambergMario Mani, PA-C, on 02/06/16.  At tha appointment she reported a new episode of palpitations.  Her heart rate at the time was 102 bpm.  TSH, CBC, and CMP were within normal limits.  EKG showed sinus rhythm at 71 bpm.    Past Medical History:  Diagnosis Date  . Depression   . Generalized headaches   . GERD (gastroesophageal reflux disease)   . Hyperlipidemia   . Migraine     Past Surgical History:  Procedure Laterality Date  . EYE SURGERY    . TONSILLECTOMY AND ADENOIDECTOMY       Current Outpatient Prescriptions  Medication Sig Dispense Refill  . diphenhydrAMINE (BENADRYL) 25 MG tablet Take by mouth.    . doxycycline (PERIOSTAT) 20 MG tablet     . doxycycline (PERIOSTAT) 20 MG tablet Take 20 mg by mouth 2 (two) times daily.  3  . etonogestrel (NEXPLANON) 68 MG IMPL implant Inject into the skin.    Marland Kitchen. JUNEL FE 1/20 1-20 MG-MCG tablet Take 1 tablet by mouth daily.   6  . metoCLOPramide (REGLAN) 10 MG tablet Take by mouth.    . metroNIDAZOLE (METROGEL) 0.75 % gel APPLY TO FACE TWICE A DAY  3  . metroNIDAZOLE (METROGEL) 0.75 % gel     . Multiple Vitamins-Minerals (MULTIVITAMIN WITH MINERALS) tablet Take 1 tablet by mouth daily.    . Multiple Vitamins-Minerals (THERA-M) TABS Take by mouth.    . naproxen (NAPROSYN) 500 MG tablet Take by mouth.    . norethindrone-ethinyl estradiol (JUNEL FE 1/20) 1-20 MG-MCG tablet     . omeprazole (PRILOSEC) 40 MG capsule Take 40 mg by mouth daily.  3  . omeprazole (PRILOSEC) 40 MG capsule Take 40 mg by mouth.    . propranolol (INDERAL) 10 MG tablet Take 1  tablet (10 mg total) by mouth 2 (two) times daily. 30 tablet 1  . rizatriptan (MAXALT) 10 MG tablet Take by mouth.    . valACYclovir (VALTREX) 500 MG tablet Take 500 mg by mouth daily as needed.   6  . valACYclovir (VALTREX) 500 MG tablet As needed     No current facility-administered medications for this visit.     Allergies:   Esomeprazole magnesium    Social History:  The patient  reports that she has quit smoking. She does not have any smokeless tobacco history on file. She reports that she drinks alcohol. She reports that she does not use drugs.   Family History:  [name] ***family history includes Cancer in her brother and father; Diabetes in her maternal uncle; Hypertension in her father and mother.    ROS:  Please see the history of present illness.   Otherwise, review of systems are positive for {NONE DEFAULTED:18576::"none"}.   All other systems are reviewed and negative.    PHYSICAL EXAM: VS:  There were no vitals taken for this visit. , BMI There is no height or weight on file to calculate BMI. GENERAL:  Well appearing HEENT:  Pupils equal round and reactive, fundi not visualized, oral mucosa  unremarkable NECK:  No jugular venous distention, waveform within normal limits, carotid upstroke brisk and symmetric, no bruits, no thyromegaly LYMPHATICS:  No cervical adenopathy LUNGS:  Clear to auscultation bilaterally HEART:  RRR.  PMI not displaced or sustained,S1 and S2 within normal limits, no S3, no S4, no clicks, no rubs, *** murmurs ABD:  Flat, positive bowel sounds normal in frequency in pitch, no bruits, no rebound, no guarding, no midline pulsatile mass, no hepatomegaly, no splenomegaly EXT:  2 plus pulses throughout, no edema, no cyanosis no clubbing SKIN:  No rashes no nodules NEURO:  Cranial nerves II through XII grossly intact, motor grossly intact throughout PSYCH:  Cognitively intact, oriented to person place and time    EKG:  EKG {ACTION; IS/IS  ZOX:[id number]}OT:21021397} ordered today. The ekg ordered today demonstrates ***   Recent Labs: 02/06/2016: ALT 31; BUN 14; Creat 0.60; Hemoglobin 13.4; Potassium 4.4; Sodium 136; TSH 1.29    Lipid Panel    Component Value Date/Time   CHOL 183 09/29/2015 0936   TRIG 76.0 09/29/2015 0936   HDL 51.40 09/29/2015 0936   CHOLHDL 4 09/29/2015 0936   VLDL 15.2 09/29/2015 0936   LDLCALC 116 (H) 09/29/2015 0936      Wt Readings from Last 3 Encounters:  02/06/16 70.8 kg (156 lb)  09/29/15 68 kg (150 lb)  01/20/15 69.4 kg (153 lb)      ASSESSMENT AND PLAN:  ***   Current medicines are reviewed at length with the patient today.  The patient {ACTIONS; HAS/DOES NOT HAVE:19233} concerns regarding medicines.  The following changes have been made:  {PLAN; NO CHANGE:13088:s}  Labs/ tests ordered today include: *** No orders of the defined types were placed in this encounter.    Disposition:   FU with ***    This note was written with the assistance of speech recognition software.  Please excuse any transcriptional errors.  Signed, Tiffany C. Duke Salviaandolph, MD, [facility]  10/02/2016 8:26 PM    [facility]

## 2016-10-03 ENCOUNTER — Encounter: Payer: Self-pay | Admitting: *Deleted

## 2016-10-03 ENCOUNTER — Ambulatory Visit: Payer: Self-pay | Admitting: Cardiovascular Disease

## 2016-10-17 DIAGNOSIS — G43009 Migraine without aura, not intractable, without status migrainosus: Secondary | ICD-10-CM | POA: Diagnosis not present

## 2016-10-31 DIAGNOSIS — G43009 Migraine without aura, not intractable, without status migrainosus: Secondary | ICD-10-CM | POA: Diagnosis not present

## 2016-11-06 DIAGNOSIS — Z91018 Allergy to other foods: Secondary | ICD-10-CM | POA: Diagnosis not present

## 2016-11-06 DIAGNOSIS — T781XXA Other adverse food reactions, not elsewhere classified, initial encounter: Secondary | ICD-10-CM | POA: Diagnosis not present

## 2016-11-06 DIAGNOSIS — Z91011 Allergy to milk products: Secondary | ICD-10-CM | POA: Diagnosis not present

## 2016-11-06 DIAGNOSIS — Z91010 Allergy to peanuts: Secondary | ICD-10-CM | POA: Diagnosis not present

## 2016-11-08 DIAGNOSIS — Z91018 Allergy to other foods: Secondary | ICD-10-CM | POA: Insufficient documentation

## 2016-11-27 ENCOUNTER — Telehealth: Payer: Self-pay | Admitting: *Deleted

## 2016-11-27 NOTE — Telephone Encounter (Signed)
"  I am scheduled for surgery on January 8.  I would like to know how soon I will be able to schedule surgery for my other foot.  The reason I am asking is because my deductible is starting over in June.  I need to have it done before then."  Dr. Marylene LandStover said it will take about 3 months.  "When can I schedule it?"  You can ask to schedule it when you come I for your first post-operative appointment.  "Okay, thank you."

## 2016-11-29 ENCOUNTER — Telehealth: Payer: Self-pay | Admitting: *Deleted

## 2016-11-29 NOTE — Telephone Encounter (Signed)
"  I have a few questions about my surgery on Monday.  Please give me a call."  I am returning your call.  "Yes, I wanted to know how long I would not be able to drive."  You're having the right foot done, it could be a couple of weeks.  "I know she had mentioned that I would have the boot on for about 3 weeks and then I guess I go into one of those shoes?"  Yes, you will go into the shoe next.  "Will I be able to drive with the shoe on?"  It depends on your progression.  "Okay, that's all I needed to know."

## 2016-12-03 ENCOUNTER — Encounter: Payer: Self-pay | Admitting: Sports Medicine

## 2016-12-03 DIAGNOSIS — M2011 Hallux valgus (acquired), right foot: Secondary | ICD-10-CM | POA: Diagnosis not present

## 2016-12-03 DIAGNOSIS — M25571 Pain in right ankle and joints of right foot: Secondary | ICD-10-CM | POA: Diagnosis not present

## 2016-12-03 DIAGNOSIS — K219 Gastro-esophageal reflux disease without esophagitis: Secondary | ICD-10-CM | POA: Diagnosis not present

## 2016-12-03 DIAGNOSIS — M21611 Bunion of right foot: Secondary | ICD-10-CM | POA: Diagnosis not present

## 2016-12-04 ENCOUNTER — Encounter: Payer: Self-pay | Admitting: Sports Medicine

## 2016-12-04 ENCOUNTER — Telehealth: Payer: Self-pay | Admitting: *Deleted

## 2016-12-04 NOTE — Telephone Encounter (Addendum)
Pt states she missed Dr. Wynema BirchStover's call and has some questions. Pt called states she thinks the pain medication is making her nauseous, can she just take ibuprofen. I asked pt if she had received rx for Phenergan or Zofran for nausea and she states yes. I told her to take one of them about 30 minutes before taking the pain medication and also take the pain medication with a light snack. I told pt I didn't think she would be comfortable just on the ibuprofen and pt states understanding. 03/05/2017-Cathy - [facility] states pt is +DVT of left proximal posterior tibial vein, they refer back to ordering doctor, who refers to PCP. I told Lynden AngCathy to send pt to our office for instructions. I informed Dr. Marylene LandStover and she agreed to referral to PCP.Pt presented to office for results and I informed of +DVT results and referred to [facility] [phone]. I [facility] with [facility], she stated pt could be seen in office today at 3:00pm by Dr. Benjiman CoreBrittany Wiseman, pt accepted the appt and left to go straight to her appt.03/07/2017-Pt states she had questions and wanted to give an update. Left message to call again and I would help her. Pt called again states her family doctor sent her for a CT and she does have a pulmonary embolism, and is on blood thinners and was told to take only tylenol and she is in a pretty good amount of pain. I told pt to include ice therapy, 15 minutes every hour to the tylenol she was taking for pain and pt stated understanding.03/08/2017-Pt states she has a lot of bruising just at the incision site, came up Tuesday and the toe and it hurts. I told pt it was related mostly to the post op stage of the surgery and may be little the Eliquis.03/11/2017-Left message calling to see how pt is and to ice for comfort and decrease the bruising, and to call with concerns. 04/03/2017-Pt left message requesting pain medication. 04/04/2017-Left message for pt to call concerning pain  medication. Pt called states is taking hydrocodone. Dr. Marylene LandStover ordered refill as previously. [place].

## 2016-12-04 NOTE — Progress Notes (Signed)
Post op phone call made. Patient did not answer. Left voicemail with callback phone # to call office if there are any issues post op  Dr. Marylene LandStover

## 2016-12-05 NOTE — Progress Notes (Signed)
DOS 01.08.2018 Right Bunionectomy with Screw

## 2016-12-11 ENCOUNTER — Encounter: Payer: Self-pay | Admitting: Sports Medicine

## 2016-12-11 ENCOUNTER — Ambulatory Visit (INDEPENDENT_AMBULATORY_CARE_PROVIDER_SITE_OTHER): Payer: BLUE CROSS/BLUE SHIELD

## 2016-12-11 ENCOUNTER — Ambulatory Visit (INDEPENDENT_AMBULATORY_CARE_PROVIDER_SITE_OTHER): Payer: Self-pay | Admitting: Sports Medicine

## 2016-12-11 DIAGNOSIS — M21619 Bunion of unspecified foot: Secondary | ICD-10-CM

## 2016-12-11 DIAGNOSIS — Z9889 Other specified postprocedural states: Secondary | ICD-10-CM

## 2016-12-11 NOTE — Progress Notes (Signed)
Subjective: Stacy Wilkins is a 31 y.o. female patient seen today in office for POV #1 (DOS 12-03-16), S/P Right austin bunionectomy with screw. Patient denies pain at surgical site, denies calf pain, denies headache, chest pain, shortness of breath, nausea, vomiting, fever, or chills. Patient states that she is doing well and is only taking Ibuprofen and Norco at night. No other issues noted.   Patient Active Problem List   Diagnosis Date Noted  . Dysfunction of both eustachian tubes 06/14/2016  . Referred otalgia of left ear 06/14/2016  . Tinnitus of both ears 06/14/2016  . Palpitations 02/27/2016  . Hemorrhoids 09/29/2015  . Routine general medical examination at a health care facility 09/29/2015  . Encounter for general adult medical examination without abnormal findings 09/29/2015  . Migraine without aura and responsive to treatment 02/15/2015  . Migraine without aura and without status migrainosus, not intractable 02/15/2015    Current Outpatient Prescriptions on File Prior to Visit  Medication Sig Dispense Refill  . diphenhydrAMINE (BENADRYL) 25 MG tablet Take by mouth.    . docusate sodium (COLACE) 100 MG capsule Take 100 mg by mouth daily as needed for mild constipation.    Marland Kitchen doxycycline (PERIOSTAT) 20 MG tablet     . doxycycline (PERIOSTAT) 20 MG tablet Take 20 mg by mouth 2 (two) times daily.  3  . etonogestrel (NEXPLANON) 68 MG IMPL implant Inject into the skin.    Marland Kitchen JUNEL FE 1/20 1-20 MG-MCG tablet Take 1 tablet by mouth daily.   6  . metoCLOPramide (REGLAN) 10 MG tablet Take by mouth.    . metroNIDAZOLE (METROGEL) 0.75 % gel APPLY TO FACE TWICE A DAY  3  . metroNIDAZOLE (METROGEL) 0.75 % gel     . Multiple Vitamins-Minerals (MULTIVITAMIN WITH MINERALS) tablet Take 1 tablet by mouth daily.    . Multiple Vitamins-Minerals (THERA-M) TABS Take by mouth.    . naproxen (NAPROSYN) 500 MG tablet Take by mouth.    . norethindrone-ethinyl estradiol (JUNEL FE 1/20) 1-20 MG-MCG tablet      . promethazine (PHENERGAN) 25 MG tablet Take 25 mg by mouth every 8 (eight) hours as needed for nausea or vomiting.    . propranolol (INDERAL) 10 MG tablet Take 1 tablet (10 mg total) by mouth 2 (two) times daily. 30 tablet 1  . rizatriptan (MAXALT) 10 MG tablet Take by mouth.    . valACYclovir (VALTREX) 500 MG tablet Take 500 mg by mouth daily as needed.   6  . valACYclovir (VALTREX) 500 MG tablet As needed     No current facility-administered medications on file prior to visit.     Allergies  Allergen Reactions  . Esomeprazole Magnesium Other (See Comments)    Other Other reaction(s): Unknown    Objective: There were no vitals filed for this visit.  General: No acute distress, AAOx3  Right foot: Sutures intact with no gapping or dehiscence at surgical site, mild swelling to right foot, no erythema, no warmth, no drainage, no signs of infection noted, Capillary fill time <3 seconds in all digits, gross sensation present via light touch to right foot. No pain or crepitation with range of motion right foot.  No pain with calf compression.   Post Op Xray, Right foot- 1st met and screw excellent alignment and position. Osteotomy site healing. Hardware intact. Soft tissue swelling within normal limits for post op status.   Assessment and Plan:  Problem List Items Addressed This Visit    None  Visit Diagnoses    S/P foot surgery, right    -  Primary   Bunion       Relevant Orders   DG Foot Complete Right      -Patient seen and evaluated -Xrays reviewed  -Applied dry sterile dressing to surgical site right foot secured with ACE wrap and stockinet  -Advised patient to make sure to keep dressings clean, dry, and intact to right surgical site, removing the ACE as needed  -Advised patient to continue with boot and crutches on right foot   -Advised patient to limit activity to necessity  -Advised patient to ice and elevate as necessary  -Will plan for suture removal at next  office visit and allowing patient to weightbear with boot. In the meantime, patient to call office if any issues or problems arise.   Landis Martins, DPM

## 2016-12-18 ENCOUNTER — Encounter: Payer: BLUE CROSS/BLUE SHIELD | Admitting: Podiatry

## 2016-12-18 ENCOUNTER — Ambulatory Visit (INDEPENDENT_AMBULATORY_CARE_PROVIDER_SITE_OTHER): Payer: Self-pay | Admitting: Sports Medicine

## 2016-12-18 DIAGNOSIS — M21619 Bunion of unspecified foot: Secondary | ICD-10-CM

## 2016-12-18 DIAGNOSIS — Z9889 Other specified postprocedural states: Secondary | ICD-10-CM

## 2016-12-18 NOTE — Progress Notes (Signed)
Subjective: Stacy Wilkins is a 31 y.o. female patient seen today in office for POV #2 (DOS 12-03-16), S/P Right austin bunionectomy with screw. Patient denies pain at surgical site, denies calf pain, denies headache, chest pain, shortness of breath, nausea, vomiting, fever, or chills. Patient states that she is doing well and is only taking Ibuprofen as needed. No other issues noted.   Patient Active Problem List   Diagnosis Date Noted  . Dysfunction of both eustachian tubes 06/14/2016  . Referred otalgia of left ear 06/14/2016  . Tinnitus of both ears 06/14/2016  . Palpitations 02/27/2016  . Hemorrhoids 09/29/2015  . Routine general medical examination at a health care facility 09/29/2015  . Encounter for general adult medical examination without abnormal findings 09/29/2015  . Migraine without aura and responsive to treatment 02/15/2015  . Migraine without aura and without status migrainosus, not intractable 02/15/2015    Current Outpatient Prescriptions on File Prior to Visit  Medication Sig Dispense Refill  . chlorzoxazone (PARAFON) 500 MG tablet TAKE 1 TABLET BY MOUTH 4 TIMES A DAY AS NEEDED  2  . diphenhydrAMINE (BENADRYL) 25 MG tablet Take by mouth.    . docusate sodium (COLACE) 100 MG capsule Take 100 mg by mouth daily as needed for mild constipation.    Marland Kitchen doxycycline (PERIOSTAT) 20 MG tablet     . doxycycline (PERIOSTAT) 20 MG tablet Take 20 mg by mouth 2 (two) times daily.  3  . etonogestrel (NEXPLANON) 68 MG IMPL implant Inject into the skin.    Marland Kitchen HYDROcodone-acetaminophen (NORCO) 10-325 MG tablet Take 1 tablet by mouth every 6 (six) hours as needed. for pain  0  . IRON PO Take by mouth.    Colleen Can FE 1/20 1-20 MG-MCG tablet Take 1 tablet by mouth daily.   6  . metoCLOPramide (REGLAN) 10 MG tablet Take by mouth.    . metroNIDAZOLE (METROGEL) 0.75 % gel APPLY TO FACE TWICE A DAY  3  . metroNIDAZOLE (METROGEL) 0.75 % gel     . metroNIDAZOLE (METROGEL) 1 % gel APPLY TO AFFECTED  AREA EVERY DAY  3  . Multiple Vitamins-Minerals (MULTIVITAMIN WITH MINERALS) tablet Take 1 tablet by mouth daily.    . Multiple Vitamins-Minerals (THERA-M) TABS Take by mouth.    . naproxen (NAPROSYN) 500 MG tablet Take by mouth.    . norethindrone-ethinyl estradiol (JUNEL FE 1/20) 1-20 MG-MCG tablet     . omeprazole (PRILOSEC) 40 MG capsule Take 40 mg by mouth daily.  2  . omeprazole (PRILOSEC) 40 MG capsule Take by mouth.    . promethazine (PHENERGAN) 25 MG tablet Take 25 mg by mouth every 8 (eight) hours as needed for nausea or vomiting.    . propranolol (INDERAL) 10 MG tablet Take 1 tablet (10 mg total) by mouth 2 (two) times daily. 30 tablet 1  . rizatriptan (MAXALT) 10 MG tablet Take by mouth.    . TROKENDI XR 25 MG CP24 Take 3 capsules by mouth daily.  2  . valACYclovir (VALTREX) 500 MG tablet Take 500 mg by mouth daily as needed.   6  . valACYclovir (VALTREX) 500 MG tablet As needed     No current facility-administered medications on file prior to visit.     Allergies  Allergen Reactions  . Esomeprazole Magnesium Other (See Comments)    Other Other reaction(s): Unknown    Objective: There were no vitals filed for this visit.  General: No acute distress, AAOx3  Right foot: Sutures  intact with no gapping or dehiscence at surgical site, mild swelling to right foot, no erythema, no warmth, no drainage, no signs of infection noted, Capillary fill time <3 seconds in all digits, gross sensation present via light touch to right foot. No pain or crepitation with range of motion right foot.  No pain with calf compression.   Assessment and Plan:  Problem List Items Addressed This Visit    None    Visit Diagnoses    S/P foot surgery, right    -  Primary   Bunion          -Patient seen and evaluated -Sutures removed. Protective dressing applied. Patient may shower starting tomorrow and advised to let steristrips fall off on their own  -Advised patient to continue with boot and may  weightbear and discontinue use of crutches  -Advised patient to limit activity to necessity  -Advised patient to ice and elevate as necessary  -Will plan for range motion and transition out of boot next visit. In the meantime, patient to call office if any issues or problems arise.   Asencion Islamitorya Britiney Blahnik, DPM

## 2016-12-21 ENCOUNTER — Telehealth: Payer: Self-pay

## 2016-12-21 NOTE — Telephone Encounter (Signed)
Spoke with pt about concerns with her 5th toe being numb, advised her to that as long as the toe was pink warm and non-painful she could wait and discuss this with Dr Marylene LandStover st her next post op visit

## 2016-12-25 ENCOUNTER — Encounter: Payer: Self-pay | Admitting: Sports Medicine

## 2017-01-01 ENCOUNTER — Ambulatory Visit (INDEPENDENT_AMBULATORY_CARE_PROVIDER_SITE_OTHER): Payer: Self-pay | Admitting: Sports Medicine

## 2017-01-01 DIAGNOSIS — Z9889 Other specified postprocedural states: Secondary | ICD-10-CM

## 2017-01-01 DIAGNOSIS — M21619 Bunion of unspecified foot: Secondary | ICD-10-CM

## 2017-01-01 NOTE — Patient Instructions (Signed)

## 2017-01-01 NOTE — Progress Notes (Signed)
Subjective: Stacy Wilkins is a 31 y.o. female patient seen today in office for POV #3 (DOS 12-03-16), S/P Right austin bunionectomy with screw. Patient denies pain at surgical site, denies calf pain, denies headache, chest pain, shortness of breath, nausea, vomiting, fever, or chills. Patient states that she is doing well and is only taking Ibuprofen as needed, Admits to nonpainful numbness at 5th toe. Patient also desires to plan for left foot surgery for April. No other issues noted.   Patient Active Problem List   Diagnosis Date Noted  . Dysfunction of both eustachian tubes 06/14/2016  . Referred otalgia of left ear 06/14/2016  . Tinnitus of both ears 06/14/2016  . Palpitations 02/27/2016  . Hemorrhoids 09/29/2015  . Routine general medical examination at a health care facility 09/29/2015  . Encounter for general adult medical examination without abnormal findings 09/29/2015  . Migraine without aura and responsive to treatment 02/15/2015  . Migraine without aura and without status migrainosus, not intractable 02/15/2015    Current Outpatient Prescriptions on File Prior to Visit  Medication Sig Dispense Refill  . chlorzoxazone (PARAFON) 500 MG tablet TAKE 1 TABLET BY MOUTH 4 TIMES A DAY AS NEEDED  2  . diphenhydrAMINE (BENADRYL) 25 MG tablet Take by mouth.    . docusate sodium (COLACE) 100 MG capsule Take 100 mg by mouth daily as needed for mild constipation.    Marland Kitchen doxycycline (PERIOSTAT) 20 MG tablet     . doxycycline (PERIOSTAT) 20 MG tablet Take 20 mg by mouth 2 (two) times daily.  3  . etonogestrel (NEXPLANON) 68 MG IMPL implant Inject into the skin.    Marland Kitchen HYDROcodone-acetaminophen (NORCO) 10-325 MG tablet Take 1 tablet by mouth every 6 (six) hours as needed. for pain  0  . IRON PO Take by mouth.    Colleen Can FE 1/20 1-20 MG-MCG tablet Take 1 tablet by mouth daily.   6  . metoCLOPramide (REGLAN) 10 MG tablet Take by mouth.    . metroNIDAZOLE (METROGEL) 0.75 % gel APPLY TO FACE TWICE A  DAY  3  . metroNIDAZOLE (METROGEL) 0.75 % gel     . metroNIDAZOLE (METROGEL) 1 % gel APPLY TO AFFECTED AREA EVERY DAY  3  . Multiple Vitamins-Minerals (MULTIVITAMIN WITH MINERALS) tablet Take 1 tablet by mouth daily.    . Multiple Vitamins-Minerals (THERA-M) TABS Take by mouth.    . naproxen (NAPROSYN) 500 MG tablet Take by mouth.    . norethindrone-ethinyl estradiol (JUNEL FE 1/20) 1-20 MG-MCG tablet     . omeprazole (PRILOSEC) 40 MG capsule Take 40 mg by mouth daily.  2  . omeprazole (PRILOSEC) 40 MG capsule Take by mouth.    . promethazine (PHENERGAN) 25 MG tablet Take 25 mg by mouth every 8 (eight) hours as needed for nausea or vomiting.    . propranolol (INDERAL) 10 MG tablet Take 1 tablet (10 mg total) by mouth 2 (two) times daily. 30 tablet 1  . rizatriptan (MAXALT) 10 MG tablet Take by mouth.    . TROKENDI XR 25 MG CP24 Take 3 capsules by mouth daily.  2  . valACYclovir (VALTREX) 500 MG tablet Take 500 mg by mouth daily as needed.   6  . valACYclovir (VALTREX) 500 MG tablet As needed     No current facility-administered medications on file prior to visit.     Allergies  Allergen Reactions  . Esomeprazole Magnesium Other (See Comments)    Other Other reaction(s): Unknown    Objective:  There were no vitals filed for this visit.  General: No acute distress, AAOx3  Right foot: Incision healing well at surgical site, mild swelling to right foot, no erythema, no warmth, no drainage, no signs of infection noted, Capillary fill time <3 seconds in all digits, gross sensation present via light touch to right foot. No pain or crepitation with range of motion right foot.  No pain with calf compression.   Left bunion unchanged from prior.   Assessment and Plan:  Problem List Items Addressed This Visit    None    Visit Diagnoses    S/P foot surgery, right    -  Primary   Bunion          -Patient seen and evaluated -Encouraged scar cream and right 1st MTPJ range of motion   -Advised patient to slowly wean from boot to supportive shoe as swelling allows   -Advised patient to limit activity to necessity  -Advised patient to ice and elevate as necessary  -Will plan for xrays to follow up healing on right and recheck numbness at 5th toe which is likely transient. In the meantime, patient to call office if any issues or problems arise.  -Previous Left foot xrays reviewed  -Patient opt for surgical management for left bunion for April. Consent obtained for Left austin bunion with screw Pre and Post op course explained. Risks, benefits, alternatives explained. No guarantees given or implied. Surgical booking slip submitted and provided patient with Surgical packet and info for [facility] -Patient to use [facility] Walker she has post op for left.  Asencion Islamitorya Stover, DPM

## 2017-01-03 ENCOUNTER — Ambulatory Visit (INDEPENDENT_AMBULATORY_CARE_PROVIDER_SITE_OTHER): Payer: BLUE CROSS/BLUE SHIELD | Admitting: Physician Assistant

## 2017-01-03 VITALS — BP 120/76 | HR 101 | Temp 97.8°F | Ht 66.0 in | Wt 158.0 lb

## 2017-01-03 DIAGNOSIS — F411 Generalized anxiety disorder: Secondary | ICD-10-CM

## 2017-01-03 DIAGNOSIS — R05 Cough: Secondary | ICD-10-CM | POA: Diagnosis not present

## 2017-01-03 DIAGNOSIS — F410 Panic disorder [episodic paroxysmal anxiety] without agoraphobia: Secondary | ICD-10-CM | POA: Diagnosis not present

## 2017-01-03 DIAGNOSIS — R059 Cough, unspecified: Secondary | ICD-10-CM

## 2017-01-03 MED ORDER — HYDROCOD POLST-CPM POLST ER 10-8 MG/5ML PO SUER: 5.0000 mL | Freq: Two times a day (BID) | ORAL | 0 refills | Status: DC | PRN

## 2017-01-03 MED ORDER — SERTRALINE HCL 50 MG PO TABS: ORAL_TABLET | ORAL | 0 refills | Status: DC

## 2017-01-03 MED ORDER — BENZONATATE 100 MG PO CAPS: 100.0000 mg | ORAL_CAPSULE | Freq: Three times a day (TID) | ORAL | 0 refills | Status: DC | PRN

## 2017-01-03 MED ORDER — ALPRAZOLAM 0.25 MG PO TABS
0.2500 mg | ORAL_TABLET | ORAL | 0 refills | Status: DC | PRN
Start: 2017-01-03 — End: 2017-08-14

## 2017-01-03 NOTE — Progress Notes (Signed)
MRN: [id number] DOB: Mar 15, 1986  Subjective:   Stacy Wilkins is a 31 y.o. female presenting for chief complaint of Sore Throat and Cough (chest burns when coughing) . Reports 1 day history of dry cough (no hemoptysis) and scratchy throat. Has tried cough drops with moderate relief. Denies fever, sinus headache, rhinorrhea, ear pain, wheezing, shortness of breath and myalgia, chills, fatigue, malaise, nausea, vomiting, abdominal pain and diarrhea. Has had sick contact with coworkers. No history of seasonal allergies or history of asthma. Patient has had flu shot this season. No smoking or alcohol use.  Pt would mainly like to discuss anxiety. States she has had it for years and it has recently gotten a lot worse. She just lost her cat a few weeks ago and notes after this she had her first panic attack where her heart was racing and she felt as if she could not breathe as if she were suffocating. Notes it lasted for 20-25 minutes. She has had about 3 more panic attacks since then although they have not been as severe. States since her first panic attack, she has been worrying about every little thing and feels like she does not have control over the world. She has tried lexapro about 4 years ago and did not like the way it made it her feel. Admits to trying one of her mother's xanax after having a panic attack and notes it did help her calm down. She is currently in therapy for the past 6 months and notes she cannot really tell a difference. [facility]. Pt denies any depressed mood or thoughts of suicide.   Stacy Wilkins has a current medication list which includes the following prescription(s): doxycycline, doxycycline, iron, junel fe 1/20, multivitamin with minerals, thera-m, norethindrone-ethinyl estradiol, omeprazole, omeprazole, promethazine, propranolol, valacyclovir, valacyclovir, chlorzoxazone, diphenhydramine, docusate sodium, etonogestrel, hydrocodone-acetaminophen,  metoclopramide, metronidazole, metronidazole, metronidazole, naproxen, rizatriptan, and trokendi xr. Also is allergic to esomeprazole magnesium.  Stacy Wilkins  has a past medical history of Anxiety; Depression; Generalized headaches; GERD (gastroesophageal reflux disease); Hyperlipidemia; Migraine; and Ulcer (HCC). Also  has a past surgical history that includes Tonsillectomy and adenoidectomy and Eye surgery.   Objective:   Vitals: BP 120/76   Pulse (!) 101   Temp 97.8 F (36.6 C) (Oral)   Ht 5\' 6"  (1.676 m)   Wt 158 lb (71.7 kg)   LMP  (LMP Unknown)   SpO2 95%   BMI 25.50 kg/m   Physical Exam  Constitutional: She is oriented to person, place, and time. She appears well-developed and well-nourished.  HENT:  Head: Normocephalic and atraumatic.  Right Ear: Tympanic membrane, external ear and ear canal normal.  Left Ear: Tympanic membrane, external ear and ear canal normal.  Nose: Nose normal. Right sinus exhibits no maxillary sinus tenderness and no frontal sinus tenderness. Left sinus exhibits no maxillary sinus tenderness and no frontal sinus tenderness.  Mouth/Throat: Uvula is midline and mucous membranes are normal. Posterior oropharyngeal erythema (mild) present. No tonsillar exudate.  Eyes: Conjunctivae are normal.  Neck: Normal range of motion.  Cardiovascular: Normal rate, regular rhythm and normal heart sounds.   Pulmonary/Chest: Effort normal and breath sounds normal.  Lymphadenopathy:       Head (right side): No submental, no submandibular, no tonsillar, no preauricular, no posterior auricular and no occipital adenopathy present.       Head (left side): No submental, no submandibular, no tonsillar, no preauricular, no posterior auricular and no occipital adenopathy present.  She has no cervical adenopathy.       Right: No supraclavicular adenopathy present.       Left: No supraclavicular adenopathy present.  Neurological: She is alert and oriented to person, place, and  time.  Skin: Skin is warm and dry.  Psychiatric: Her mood appears anxious.  Vitals reviewed.   Pulse Readings from Last 3 Encounters:  01/03/17 (!) 101  02/06/16 (!) 102  09/29/15 97    GAD 7 : Generalized Anxiety Score 01/03/2017  Nervous, Anxious, on Edge 2  Control/stop worrying 3  Worry too much - different things 3  Trouble relaxing 3  Restless 0  Easily annoyed or irritable 0  Afraid - awful might happen 0  Total GAD 7 Score 11  Anxiety Difficulty Not difficult at all    No results found for this or any previous visit (from the past 24 hour(s)).  Assessment and Plan :  1. Anxiety state -Pt will likely benefit from medication along with therapy. She has been informed of side effects of zoloft. She has also been given additional information for local therapists in case she would like to see someone else. Follow up in 4 weeks for reevaluation, may consider increasing zoloft dose to 200mg  daily if pt is tolerating well.  - sertraline (ZOLOFT) 50 MG tablet; Week 1: Take one tablet daily Week 2: Take two tablets daily Week 3 and 4: Take three tablets daily  Dispense: 90 tablet; Refill: 0  2. Panic attacks -Due to severity of patient's panic attacks, she will likely benefit from use of benzos for panic attacks while zoloft is taking effect over the next 4-6 weeks. She has been informed that benzos can be habit forming and should only be used as prescribed. Also instructed that this is not a long term medication but rather a short term medication while zoloft is taking effect. Pt understands and agrees to treatment plan. She also states that she does not want to be on a long term medication that can be habit forming.  -[place] Controlled Database reviewed and consistent with patient's history.  - ALPRAZolam (XANAX) 0.25 MG tablet; Take 1 tablet (0.25 mg total) by mouth as needed for anxiety (panic attacks). Do not take more than two tablets per day.  Dispense: 20 tablet; Refill: 0  3.  Cough -Likely viral etiology, will treat symptomatically, pt instructed to return to clinic if symptoms worsen, do not improve in 7-10 days, or as needed - benzonatate (TESSALON) 100 MG capsule; Take 1-2 capsules (100-200 mg total) by mouth 3 (three) times daily as needed for cough.  Dispense: 40 capsule; Refill: 0 - chlorpheniramine-HYDROcodone (TUSSIONEX PENNKINETIC ER) 10-8 MG/5ML SUER; Take 5 mLs by mouth every 12 (twelve) hours as needed for cough.  Dispense: 100 mL; Refill: 0  Benjiman CoreBrittany Wiseman, PA-C  [facility] 01/03/2017 3:38 PM

## 2017-01-03 NOTE — Patient Instructions (Addendum)
-   We will treat this as a respiratory viral infection.  - I recommend you rest, drink plenty of fluids, eat light meals including soups.  - You may use cough syrup at night for your cough and sore throat, Tessalon pearls during the day. Be aware that cough syrup can definitely make you drowsy and sleepy so do not drive or operate any heavy machinery if it is affecting you during the day.  - You may also use Tylenol or ibuprofen over-the-counter for your sore throat.  - Please let me know if you are not seeing any improvement or get worse in 5-7 days.    For anxiety, please start zoloft daily. Week 1: 50mg , week 2: 100mg , week 3 and 4: 150mg . Follow up in 4 weeks for reevaluation. You may use xanax for panic attacks, do not take more than 2 tablets in one day. Common side effects of zoloft include GI upset, please take with food to help avoid these side effects, they will typically resolve after 2 weeks. If you have any suicidal thoughts while on medication, please call our office or seek care immediatly.   [facility] [address] [place], [place] [address]   Shanon RosserBarbara Farran [phone]  Maris BergerKathy Kirstner [phone]  Marco CollieSusan Kroll-Smith [phone]   IF you received an x-ray today, you will receive an invoice from [facility]. Please contact [facility] at [phone] with questions or concerns regarding your invoice.   IF you received labwork today, you will receive an invoice from [facility]. Please contact [facility] at [phone] with questions or concerns regarding your invoice.   Our billing staff will not be able to assist you with questions regarding bills from these companies.  You will be contacted with the lab results as soon as they are available. The fastest way to get your results is to activate your My Chart account. Instructions are located on the last page of this paperwork. If you have not heard from us regarding the results in 2 weeks,  please contact this office.

## 2017-01-29 ENCOUNTER — Encounter: Payer: Self-pay | Admitting: Sports Medicine

## 2017-01-29 ENCOUNTER — Ambulatory Visit (INDEPENDENT_AMBULATORY_CARE_PROVIDER_SITE_OTHER): Payer: BLUE CROSS/BLUE SHIELD | Admitting: Sports Medicine

## 2017-01-29 ENCOUNTER — Ambulatory Visit (INDEPENDENT_AMBULATORY_CARE_PROVIDER_SITE_OTHER): Payer: BLUE CROSS/BLUE SHIELD

## 2017-01-29 DIAGNOSIS — M7751 Other enthesopathy of right foot: Secondary | ICD-10-CM | POA: Diagnosis not present

## 2017-01-29 DIAGNOSIS — M7741 Metatarsalgia, right foot: Secondary | ICD-10-CM

## 2017-01-29 DIAGNOSIS — M779 Enthesopathy, unspecified: Principal | ICD-10-CM

## 2017-01-29 DIAGNOSIS — M79674 Pain in right toe(s): Secondary | ICD-10-CM | POA: Diagnosis not present

## 2017-01-29 DIAGNOSIS — Z9889 Other specified postprocedural states: Secondary | ICD-10-CM

## 2017-01-29 DIAGNOSIS — M778 Other enthesopathies, not elsewhere classified: Secondary | ICD-10-CM

## 2017-01-29 MED ORDER — TRIAMCINOLONE ACETONIDE 10 MG/ML IJ SUSP: 10.0000 mg | Freq: Once | INTRAMUSCULAR | Status: DC

## 2017-01-29 NOTE — Progress Notes (Signed)
Subjective: Stacy Wilkins is a 31 y.o. female patient seen today in office for POV #4 (DOS 12-03-16), S/P Right austin bunionectomy with screw. Patient denies pain at surgical site, however states since last week after trying to use tennis shoe had pain underneath 2nd toe so went back to CAM boot. Denies calf pain, denies headache, chest pain, shortness of breath, nausea, vomiting, fever, or chills. Admits to nonpainful numbness at 5th toe that is getting better. No other issues noted.   Patient Active Problem List   Diagnosis Date Noted  . Dysfunction of both eustachian tubes 06/14/2016  . Referred otalgia of left ear 06/14/2016  . Tinnitus of both ears 06/14/2016  . Palpitations 02/27/2016  . Hemorrhoids 09/29/2015  . Routine general medical examination at a health care facility 09/29/2015  . Encounter for general adult medical examination without abnormal findings 09/29/2015  . Migraine without aura and responsive to treatment 02/15/2015  . Migraine without aura and without status migrainosus, not intractable 02/15/2015    Current Outpatient Prescriptions on File Prior to Visit  Medication Sig Dispense Refill  . ALPRAZolam (XANAX) 0.25 MG tablet Take 1 tablet (0.25 mg total) by mouth as needed for anxiety (panic attacks). Do not take more than two tablets per day. 20 tablet 0  . benzonatate (TESSALON) 100 MG capsule Take 1-2 capsules (100-200 mg total) by mouth 3 (three) times daily as needed for cough. 40 capsule 0  . chlorpheniramine-HYDROcodone (TUSSIONEX PENNKINETIC ER) 10-8 MG/5ML SUER Take 5 mLs by mouth every 12 (twelve) hours as needed for cough. 100 mL 0  . doxycycline (PERIOSTAT) 20 MG tablet     . IRON PO Take by mouth.    Lenda Kelp FE 1/20 1-20 MG-MCG tablet Take 1 tablet by mouth daily.   6  . Multiple Vitamins-Minerals (MULTIVITAMIN WITH MINERALS) tablet Take 1 tablet by mouth daily.    . Multiple Vitamins-Minerals (THERA-M) TABS Take by mouth.    Marland Kitchen omeprazole (PRILOSEC)  40 MG capsule Take 40 mg by mouth daily.  2  . propranolol (INDERAL) 10 MG tablet Take by mouth.    . sertraline (ZOLOFT) 50 MG tablet Week 1: Take one tablet daily Week 2: Take two tablets daily Week 3 and 4: Take three tablets daily 90 tablet 0  . valACYclovir (VALTREX) 500 MG tablet Take 500 mg by mouth daily as needed.   6   No current facility-administered medications on file prior to visit.     Allergies  Allergen Reactions  . Esomeprazole Magnesium Other (See Comments)    Other Other reaction(s): Unknown    Objective: There were no vitals filed for this visit.  General: No acute distress, AAOx3  Right foot: Incision well healed at surgical site, mild swelling to right foot, no erythema, no warmth, no drainage, no signs of infection noted, Capillary fill time <3 seconds in all digits, gross sensation present via light touch to right foot. No pain or crepitation with range of motion right foot at 1st MTPJ however there is pain with range of motion at right 2nd MTPJ.  No pain with calf compression.   Xray, Right foot 1st met in good alignment, hardware intact, no acute findings.  Assessment and Plan:  Problem List Items Addressed This Visit    None    Visit Diagnoses    Capsulitis of foot, right    -  Primary   Relevant Medications   triamcinolone acetonide (KENALOG) 10 MG/ML injection 10 mg   Toe pain,  right       Relevant Medications   triamcinolone acetonide (KENALOG) 10 MG/ML injection 10 mg   Other Relevant Orders   DG Foot Complete Right (Completed)   Metatarsalgia of right foot       Relevant Medications   triamcinolone acetonide (KENALOG) 10 MG/ML injection 10 mg   S/P foot surgery, right          -Patient seen and evaluated -xrays reviewed -Discussed likely capsulitis and metatarsalgia right 2nd MTPJ -After oral consent and aseptic prep, injected a mixture containing 1 ml of 2%  plain lidocaine, 1 ml 0.5% plain marcaine, 0.5 ml of kenalog 10 and 0.5 ml of  dexamethasone phosphate into right 2nd MTPJ without complication. Post-injection care discussed with patient.  -Advised patient to slowly wean from boot to supportive shoe as swelling and pain allows after 1 week to tennis shoe with offloading padding as given -Advised patient to limit activity to tolerance -Encouraged continue with scar cream and right 1st MTPJ range of motion  -Advised patient to ice and elevate as necessary  -Will continue to monitor numbness at 5th toe which is likely transient. In the meantime, patient to call office if any issues or problems arise.  -Patient to follow up as needed or as scheduled for left bunion surgery in April.  Landis Martins, DPM

## 2017-01-30 ENCOUNTER — Ambulatory Visit: Payer: BLUE CROSS/BLUE SHIELD | Admitting: Physician Assistant

## 2017-02-12 ENCOUNTER — Ambulatory Visit: Payer: BLUE CROSS/BLUE SHIELD | Admitting: Sports Medicine

## 2017-02-18 ENCOUNTER — Ambulatory Visit (INDEPENDENT_AMBULATORY_CARE_PROVIDER_SITE_OTHER): Payer: BLUE CROSS/BLUE SHIELD | Admitting: Family Medicine

## 2017-02-18 VITALS — BP 118/83 | HR 95 | Temp 99.2°F | Resp 16

## 2017-02-18 DIAGNOSIS — L989 Disorder of the skin and subcutaneous tissue, unspecified: Secondary | ICD-10-CM | POA: Diagnosis not present

## 2017-02-18 MED ORDER — MUPIROCIN 2 % EX OINT: 1.0000 "application " | TOPICAL_OINTMENT | Freq: Three times a day (TID) | CUTANEOUS | 0 refills | Status: DC

## 2017-02-18 MED ORDER — CEPHALEXIN 500 MG PO CAPS: 500.0000 mg | ORAL_CAPSULE | Freq: Two times a day (BID) | ORAL | 0 refills | Status: DC

## 2017-02-18 MED ORDER — CEPHALEXIN 500 MG PO CAPS
500.0000 mg | ORAL_CAPSULE | Freq: Two times a day (BID) | ORAL | 0 refills | Status: DC
Start: 2017-02-18 — End: 2017-02-18

## 2017-02-18 NOTE — Progress Notes (Signed)
Subjective:    Patient ID: Stacy Wilkins, female    DOB: 26-Aug-1986, 31 y.o.   MRN: [id number]  Chief Complaint  Patient presents with  . sore on chin x 3 weeks with drainage    PCP: Magdalene River, PA-C  HPI  This is a 31 y.o. female who is presenting with lesion on her chin. Started about three weeks ago. Started with no event. Has some pain with water getting on it. No prior lesion similarly. Some drainage from it. Has tried cortisone and neosporin. The cortisone burned when it was applied. Has tried peroxide. Denies any cut in that area. No recent travel. Has not started any new make up or new lipstick. Has been staying the same. Takes doxycycline for her acne.   Review of Systems  ROS: No unexpected weight loss, fever, chills, swelling, instability, muscle pain, numbness/tingling, redness, otherwise see HPI   PMH: rosacea  Surgical: foot surgery  Social: no tobacco or alcohol use  Fhx: none   Patient Active Problem List   Diagnosis Date Noted  . Skin lesion 02/18/2017  . Dysfunction of both eustachian tubes 06/14/2016  . Referred otalgia of left ear 06/14/2016  . Tinnitus of both ears 06/14/2016  . Palpitations 02/27/2016  . Hemorrhoids 09/29/2015  . Routine general medical examination at a health care facility 09/29/2015  . Encounter for general adult medical examination without abnormal findings 09/29/2015  . Migraine without aura and responsive to treatment 02/15/2015  . Migraine without aura and without status migrainosus, not intractable 02/15/2015     Prior to Admission medications   Medication Sig Start Date End Date Taking? Authorizing Provider  ALPRAZolam (XANAX) 0.25 MG tablet Take 1 tablet (0.25 mg total) by mouth as needed for anxiety (panic attacks). Do not take more than two tablets per day. 01/03/17  Yes Magdalene River, PA-C  doxycycline (PERIOSTAT) 20 MG tablet  06/06/16  Yes Historical Provider, MD  IRON PO Take by mouth.   Yes  Historical Provider, MD  JUNEL FE 1/20 1-20 MG-MCG tablet Take 1 tablet by mouth daily.  09/09/15  Yes Historical Provider, MD  Multiple Vitamins-Minerals (MULTIVITAMIN WITH MINERALS) tablet Take 1 tablet by mouth daily.   Yes Historical Provider, MD  omeprazole (PRILOSEC) 40 MG capsule Take 40 mg by mouth daily. 11/28/16  Yes Historical Provider, MD  sertraline (ZOLOFT) 50 MG tablet Week 1: Take one tablet daily Week 2: Take two tablets daily Week 3 and 4: Take three tablets daily Patient taking differently: Take 100 mg by mouth daily.  01/03/17  Yes Magdalene River, PA-C  valACYclovir (VALTREX) 500 MG tablet Take 500 mg by mouth daily as needed.  08/17/15  Yes Historical Provider, MD  benzonatate (TESSALON) 100 MG capsule Take 1-2 capsules (100-200 mg total) by mouth 3 (three) times daily as needed for cough. Patient not taking: Reported on 02/18/2017 01/03/17   Magdalene River, PA-C  chlorpheniramine-HYDROcodone [facility]) 10-8 MG/5ML SUER Take 5 mLs by mouth every 12 (twelve) hours as needed for cough. Patient not taking: Reported on 02/18/2017 01/03/17   Magdalene River, PA-C  Multiple Vitamins-Minerals (THERA-M) TABS Take by mouth.    Historical Provider, MD  propranolol (INDERAL) 10 MG tablet Take by mouth. 02/06/16   Historical Provider, MD     Allergies  Allergen Reactions  . Esomeprazole Magnesium Other (See Comments)    Other Other reaction(s): Unknown      Objective:   Physical Exam  BP 118/83 (Cuff Size: Normal)   Pulse 95   Temp 99.2 F (37.3 C) (Oral)   Resp 16   SpO2 98%  Gen: NAD, alert, cooperative with exam, well-appearing HEENT: NCAT, EOMI, clear conjunctiva, no cervical LAD, no oral lesions.  CV: no edema, capillary refill brisk  Resp:  non-labored Skin: ulcerated skin lesion on her chin with some honey colored appearance. Some serous drainage, no streaking  Neuro: no gross deficits.  Psych: alert and oriented     Assessment & Plan:   Skin  lesion Appears to be impetigo. Possible for MRSA.  - bactroban ointment  - keflex 500 mg BID for 7 days - wound culture taken. If growing something not covered then will adjust ABX accordingly.  - given indications to return.

## 2017-02-18 NOTE — Assessment & Plan Note (Signed)
Appears to be impetigo. Possible for MRSA.  - bactroban ointment  - keflex 500 mg BID for 7 days - wound culture taken. If growing something not covered then will adjust ABX accordingly.  - given indications to return.

## 2017-02-18 NOTE — Patient Instructions (Addendum)
  Thank you for coming in,   We will inform you if we need to change antibiotics based on the results of the wound culture.   Please let us know if you symptoms get worse.    Please feel free to call with any questions or concerns at any time, at [phone]. --Dr. Jordan LikesSchmitz    IF you received an x-ray today, you will receive an invoice from [facility]. Please contact [facility] at [phone] with questions or concerns regarding your invoice.   IF you received labwork today, you will receive an invoice from [facility]. Please contact [facility] at [phone] with questions or concerns regarding your invoice.   Our billing staff will not be able to assist you with questions regarding bills from these companies.  You will be contacted with the lab results as soon as they are available. The fastest way to get your results is to activate your My Chart account. Instructions are located on the last page of this paperwork. If you have not heard from us regarding the results in 2 weeks, please contact this office.

## 2017-02-18 NOTE — Progress Notes (Signed)
[facility] computers down, resend rx to [facility]

## 2017-02-20 LAB — WOUND CULTURE: Organism ID, Bacteria: NONE SEEN

## 2017-02-21 ENCOUNTER — Telehealth: Payer: Self-pay | Admitting: Family Medicine

## 2017-02-21 NOTE — Telephone Encounter (Signed)
Spoke with patient about her culture results.   Myra RudeJeremy E Schmitz, MD PGY-4, [facility] 02/21/2017, 12:32 PM

## 2017-02-25 ENCOUNTER — Encounter: Payer: Self-pay | Admitting: *Deleted

## 2017-02-25 DIAGNOSIS — K259 Gastric ulcer, unspecified as acute or chronic, without hemorrhage or perforation: Secondary | ICD-10-CM | POA: Diagnosis not present

## 2017-02-25 DIAGNOSIS — M2012 Hallux valgus (acquired), left foot: Secondary | ICD-10-CM | POA: Diagnosis not present

## 2017-02-25 DIAGNOSIS — M21612 Bunion of left foot: Secondary | ICD-10-CM | POA: Diagnosis not present

## 2017-02-25 DIAGNOSIS — M25572 Pain in left ankle and joints of left foot: Secondary | ICD-10-CM | POA: Diagnosis not present

## 2017-02-26 ENCOUNTER — Telehealth: Payer: Self-pay | Admitting: Sports Medicine

## 2017-02-26 NOTE — Progress Notes (Signed)
Patient discussed/examined with Dr. Jordan Likes. Agree with findings, assessment and plan of care per his note.

## 2017-02-26 NOTE — Telephone Encounter (Signed)
Post op check phone call made to patient. Patient did not answer. Left voicemail with instructions for patient to call office if she has any issues or concerns about her surgical foot. Patient to follow up as scheduled. -Dr. Marylene Land

## 2017-03-04 ENCOUNTER — Telehealth: Payer: Self-pay | Admitting: Sports Medicine

## 2017-03-04 NOTE — Telephone Encounter (Signed)
I spoke with pt and she states she noticed the dried, hard blood 20 minutes ago. I told her more than likely since it was dry is as from the day of the surgery and nothing to worry about, each foot will active different after surgery. Pt states understanding.

## 2017-03-04 NOTE — Telephone Encounter (Signed)
Pt. Is noticing some dried blood  on her bandage not sure if its normal. She is scheduled to come in 9:15am  4/10. She says she is freaking out about it, because her other foot didn't do that.

## 2017-03-05 ENCOUNTER — Encounter (HOSPITAL_COMMUNITY): Payer: Self-pay

## 2017-03-05 ENCOUNTER — Ambulatory Visit (INDEPENDENT_AMBULATORY_CARE_PROVIDER_SITE_OTHER): Payer: Self-pay | Admitting: Sports Medicine

## 2017-03-05 ENCOUNTER — Ambulatory Visit (HOSPITAL_COMMUNITY)
Admission: RE | Admit: 2017-03-05 | Discharge: 2017-03-05 | Disposition: A | Discharge status: Home / Self Care | Payer: BLUE CROSS/BLUE SHIELD | Source: Ambulatory Visit | Attending: Physician Assistant | Admitting: Physician Assistant

## 2017-03-05 ENCOUNTER — Ambulatory Visit (INDEPENDENT_AMBULATORY_CARE_PROVIDER_SITE_OTHER): Payer: BLUE CROSS/BLUE SHIELD

## 2017-03-05 ENCOUNTER — Emergency Department (HOSPITAL_COMMUNITY)
Admission: EM | Admit: 2017-03-05 | Discharge: 2017-03-05 | Disposition: A | Discharge status: Home / Self Care | Payer: BLUE CROSS/BLUE SHIELD | Attending: Emergency Medicine | Admitting: Emergency Medicine

## 2017-03-05 ENCOUNTER — Ambulatory Visit (INDEPENDENT_AMBULATORY_CARE_PROVIDER_SITE_OTHER): Payer: BLUE CROSS/BLUE SHIELD | Admitting: Physician Assistant

## 2017-03-05 ENCOUNTER — Ambulatory Visit (INDEPENDENT_AMBULATORY_CARE_PROVIDER_SITE_OTHER)
Admission: RE | Admit: 2017-03-05 | Discharge: 2017-03-05 | Disposition: A | Discharge status: Home / Self Care | Payer: BLUE CROSS/BLUE SHIELD | Source: Ambulatory Visit | Attending: Vascular Surgery | Admitting: Vascular Surgery

## 2017-03-05 VITALS — BP 135/99 | HR 92 | Temp 98.0°F | Resp 16 | Ht 65.0 in | Wt 155.0 lb

## 2017-03-05 DIAGNOSIS — I82402 Acute embolism and thrombosis of unspecified deep veins of left lower extremity: Secondary | ICD-10-CM

## 2017-03-05 DIAGNOSIS — R0602 Shortness of breath: Secondary | ICD-10-CM | POA: Diagnosis not present

## 2017-03-05 DIAGNOSIS — R0789 Other chest pain: Secondary | ICD-10-CM | POA: Diagnosis not present

## 2017-03-05 DIAGNOSIS — I2699 Other pulmonary embolism without acute cor pulmonale: Secondary | ICD-10-CM

## 2017-03-05 DIAGNOSIS — I82492 Acute embolism and thrombosis of other specified deep vein of left lower extremity: Secondary | ICD-10-CM | POA: Diagnosis not present

## 2017-03-05 DIAGNOSIS — R079 Chest pain, unspecified: Secondary | ICD-10-CM

## 2017-03-05 DIAGNOSIS — I824Z2 Acute embolism and thrombosis of unspecified deep veins of left distal lower extremity: Secondary | ICD-10-CM | POA: Diagnosis not present

## 2017-03-05 DIAGNOSIS — Z9889 Other specified postprocedural states: Secondary | ICD-10-CM

## 2017-03-05 DIAGNOSIS — I2609 Other pulmonary embolism with acute cor pulmonale: Secondary | ICD-10-CM | POA: Insufficient documentation

## 2017-03-05 DIAGNOSIS — I82442 Acute embolism and thrombosis of left tibial vein: Secondary | ICD-10-CM | POA: Insufficient documentation

## 2017-03-05 DIAGNOSIS — Z87891 Personal history of nicotine dependence: Secondary | ICD-10-CM | POA: Diagnosis not present

## 2017-03-05 DIAGNOSIS — Z79899 Other long term (current) drug therapy: Secondary | ICD-10-CM | POA: Insufficient documentation

## 2017-03-05 DIAGNOSIS — R600 Localized edema: Secondary | ICD-10-CM | POA: Diagnosis not present

## 2017-03-05 LAB — BASIC METABOLIC PANEL
Anion gap: 11 (ref 5–15)
BUN: 12 mg/dL (ref 6–20)
CO2: 24 mmol/L (ref 22–32)
Calcium: 9.6 mg/dL (ref 8.9–10.3)
Chloride: 103 mmol/L (ref 101–111)
Creatinine, Ser: 0.78 mg/dL (ref 0.44–1.00)
GFR calc Af Amer: >60 mL/min (ref >60)
GFR calc non Af Amer: >60 mL/min (ref >60)
Glucose, Bld: 89 mg/dL (ref 65–99)
Potassium: 4.1 mmol/L (ref 3.5–5.1)
Sodium: 138 mmol/L (ref 135–145)

## 2017-03-05 LAB — CBC
HCT: 40.5 % (ref 36.0–46.0)
Hemoglobin: 13.7 g/dL (ref 12.0–15.0)
MCH: 29.3 pg (ref 26.0–34.0)
MCHC: 33.8 g/dL (ref 30.0–36.0)
MCV: 86.7 fL (ref 78.0–100.0)
Platelets: 215 10*3/uL (ref 150–400)
RBC: 4.67 MIL/uL (ref 3.87–5.11)
RDW: 12.5 % (ref 11.5–15.5)
WBC: 8.6 10*3/uL (ref 4.0–10.5)

## 2017-03-05 LAB — POCT URINE PREGNANCY: Preg Test, Ur: NEGATIVE

## 2017-03-05 MED ORDER — APIXABAN 5 MG PO TABS
10.0000 mg | ORAL_TABLET | Freq: Two times a day (BID) | ORAL | Status: DC
  Administered 2017-03-05: 10 mg via ORAL
  Filled 2017-03-05: qty 2

## 2017-03-05 MED ORDER — APIXABAN 5 MG PO TABS: 5.0000 mg | ORAL_TABLET | Freq: Two times a day (BID) | ORAL | Status: DC

## 2017-03-05 MED ORDER — APIXABAN 5 MG PO TABS: 5.0000 mg | ORAL_TABLET | Freq: Two times a day (BID) | ORAL | 0 refills | Status: DC

## 2017-03-05 MED ORDER — IOPAMIDOL (ISOVUE-370) INJECTION 76%
INTRAVENOUS | Status: AC
  Administered 2017-03-05: 100 mL
  Filled 2017-03-05: qty 100

## 2017-03-05 NOTE — Progress Notes (Signed)
Stacy Wilkins  MRN: [id number] DOB: 07/19/1986  Subjective:  Stacy Wilkins is a 31 y.o. female seen in office today for a chief complaint of follow up for +DVT.  Had left foot bunionectomy surgery one week ago. Has been in a CAM walker x 1 week. She went for follow up today at podiatry.She was having left calf pain and swelling. Her podiatrist sent her for [place] of left lower extremity. The podiatrist then called her and told her it was positive for a DVT and to go to her PCP so she is here now. She did not bring the [place] report with her and it has not been uploaded to [facility] yet.Pt admits to having chest pain 4 days ago. Denies current chest pain, heart palpitations, shortness of breath, pleuritic chest pian, and diaphoresis. Pt denies smoking. She is on OCP junel daily, which she has been on for 5-6 years. She uses the OCP to regulate her menstrual cycle as she is not sexually active. No personal hx of clots or malignancy. Positive FH of multiple DVTs in mother. No FH of pulmonary embolism.   Review of Systems Per HPI  Patient Active Problem List   Diagnosis Date Noted  . Skin lesion 02/18/2017  . Dysfunction of both eustachian tubes 06/14/2016  . Referred otalgia of left ear 06/14/2016  . Tinnitus of both ears 06/14/2016  . Palpitations 02/27/2016  . Hemorrhoids 09/29/2015  . Routine general medical examination at a health care facility 09/29/2015  . Encounter for general adult medical examination without abnormal findings 09/29/2015  . Migraine without aura and responsive to treatment 02/15/2015  . Migraine without aura and without status migrainosus, not intractable 02/15/2015    Current Outpatient Prescriptions on File Prior to Visit  Medication Sig Dispense Refill  . ALPRAZolam (XANAX) 0.25 MG tablet Take 1 tablet (0.25 mg total) by mouth as needed for anxiety (panic attacks). Do not take more than two tablets per day. 20 tablet 0  . doxycycline (PERIOSTAT) 20 MG tablet     .  IRON PO Take by mouth.    Colleen Can FE 1/20 1-20 MG-MCG tablet Take 1 tablet by mouth daily.   6  . Multiple Vitamins-Minerals (MULTIVITAMIN WITH MINERALS) tablet Take 1 tablet by mouth daily.    . Multiple Vitamins-Minerals (THERA-M) TABS Take by mouth.    . mupirocin ointment (BACTROBAN) 2 % Place 1 application into the nose 3 (three) times daily. 22 g 0  . omeprazole (PRILOSEC) 40 MG capsule Take 40 mg by mouth daily.  2  . propranolol (INDERAL) 10 MG tablet Take by mouth.    . sertraline (ZOLOFT) 50 MG tablet Week 1: Take one tablet daily Week 2: Take two tablets daily Week 3 and 4: Take three tablets daily (Patient taking differently: Take 100 mg by mouth daily. ) 90 tablet 0  . valACYclovir (VALTREX) 500 MG tablet Take 500 mg by mouth daily as needed.   6  . cephALEXin (KEFLEX) 500 MG capsule Take 1 capsule (500 mg total) by mouth 2 (two) times daily. For a total of 7 days. (Patient not taking: Reported on 03/05/2017) 14 capsule 0   Current Facility-Administered Medications on File Prior to Visit  Medication Dose Route Frequency Provider Last Rate Last Dose  . triamcinolone acetonide (KENALOG) 10 MG/ML injection 10 mg  10 mg Other Once Asencion Islam, DPM        Allergies  Allergen Reactions  . Esomeprazole Magnesium Other (See Comments)  Other Other reaction(s): Unknown     Objective:  BP (!) 135/99 (BP Location: Right Arm, Patient Position: Sitting, Cuff Size: Normal)   Pulse 92   Temp 98 F (36.7 C) (Oral)   Resp 16   Ht  (1.651 m)   Wt 155 lb (70.3 kg)   LMP 02/25/2017   SpO2 98%   BMI 25.79 kg/m   Physical Exam  Constitutional: She is oriented to person, place, and time and well-developed, well-nourished, and in no distress. No distress.  HENT:  Head: Normocephalic and atraumatic.  Eyes: Conjunctivae are normal.  Neck: Normal range of motion.  Cardiovascular: Normal rate, regular rhythm, normal heart sounds and intact distal pulses.   Pulmonary/Chest:  Effort normal and breath sounds normal.  Musculoskeletal:       Left lower leg: She exhibits tenderness (with palpation of superior aspect of calf) and swelling (mild, no erythema or warmth).  Left foot wrapped in gauze.     Neurological: She is alert and oriented to person, place, and time. Gait normal.  Skin: Skin is warm and dry.  Psychiatric: Affect normal.  Vitals reviewed.    Results for orders placed or performed in visit on 03/05/17 (from the past 24 hour(s))  POCT urine pregnancy     Status: None   Collection Time: 03/05/17  4:16 PM  Result Value Ref Range   Preg Test, Ur Negative Negative   Assessment and Plan :  1. Acute deep vein thrombosis (DVT) of left lower extremity, unspecified vein (HCC) 2. Chest pain, unspecified type Plan for STAT CTA and [facility]. Will contact pt with results and discuss further tx plan. - CT ANGIO CHEST PE W OR WO CONTRAST; Future - POCT urine pregnancy  Update: Radiologist contacted me in office and verbally infomred me that CTA was postiive for PE. I called patient immediately and instructed her to walk from the radiology waiting room to ED immediately for emergent evaluation. I also called [facility] ED and informed triage nurse of patient's case.   Benjiman Core PA-C  [facility] 03/05/2017 4:19 PM

## 2017-03-05 NOTE — ED Provider Notes (Signed)
[facility]-EMERGENCY DEPT Provider Note   CSN: [id number] Arrival date & time: 03/05/17  1835     History   Chief Complaint Chief Complaint  Patient presents with  . Shortness of Breath    HPI Stacy DEPOY is a 31 y.o. female.  The history is provided by the patient.  Illness  This is a new (Pt diagnosed with PE and DVT today) problem. The current episode started 6 to 12 hours ago. The problem occurs constantly. The problem has not changed since onset.Associated symptoms include chest pain and shortness of breath. Pertinent negatives include no abdominal pain. Nothing aggravates the symptoms. Nothing relieves the symptoms. She has tried nothing for the symptoms.    Past Medical History:  Diagnosis Date  . Anxiety   . Depression   . Generalized headaches   . GERD (gastroesophageal reflux disease)   . Hyperlipidemia   . Migraine   . Ulcer [facility])     Patient Active Problem List   Diagnosis Date Noted  . Skin lesion 02/18/2017  . Dysfunction of both eustachian tubes 06/14/2016  . Referred otalgia of left ear 06/14/2016  . Tinnitus of both ears 06/14/2016  . Palpitations 02/27/2016  . Hemorrhoids 09/29/2015  . Routine general medical examination at a health care facility 09/29/2015  . Encounter for general adult medical examination without abnormal findings 09/29/2015  . Migraine without aura and responsive to treatment 02/15/2015  . Migraine without aura and without status migrainosus, not intractable 02/15/2015    Past Surgical History:  Procedure Laterality Date  . bunionectomy     . EYE SURGERY    . TONSILLECTOMY AND ADENOIDECTOMY      OB History    No data available       Home Medications    Prior to Admission medications   Medication Sig Start Date End Date Taking? Authorizing Provider  ALPRAZolam (XANAX) 0.25 MG tablet Take 1 tablet (0.25 mg total) by mouth as needed for anxiety (panic attacks). Do not take more than two tablets per day. 01/03/17    Magdalene River, PA-C  apixaban (ELIQUIS STARTER PACK) 5 MG TABS tablet Take 1 tablet (5 mg total) by mouth 2 (two) times daily. 03/05/17 04/04/17  Stacy Gardner, MD  cephALEXin (KEFLEX) 500 MG capsule Take 1 capsule (500 mg total) by mouth 2 (two) times daily. For a total of 7 days. Patient not taking: Reported on 03/05/2017 02/18/17   Myra Rude, MD  doxycycline (PERIOSTAT) 20 MG tablet  06/06/16   Historical Provider, MD  IRON PO Take by mouth.    Historical Provider, MD  JUNEL FE 1/20 1-20 MG-MCG tablet Take 1 tablet by mouth daily.  09/09/15   Historical Provider, MD  Multiple Vitamins-Minerals (MULTIVITAMIN WITH MINERALS) tablet Take 1 tablet by mouth daily.    Historical Provider, MD  Multiple Vitamins-Minerals (THERA-M) TABS Take by mouth.    Historical Provider, MD  mupirocin ointment (BACTROBAN) 2 % Place 1 application into the nose 3 (three) times daily. 02/18/17   Myra Rude, MD  omeprazole (PRILOSEC) 40 MG capsule Take 40 mg by mouth daily. 11/28/16   Historical Provider, MD  propranolol (INDERAL) 10 MG tablet Take by mouth. 02/06/16   Historical Provider, MD  sertraline (ZOLOFT) 50 MG tablet Week 1: Take one tablet daily Week 2: Take two tablets daily Week 3 and 4: Take three tablets daily Patient taking differently: Take 100 mg by mouth daily.  01/03/17   Magdalene River, PA-C  valACYclovir (VALTREX) 500 MG tablet Take 500 mg by mouth daily as needed.  08/17/15   Historical Provider, MD    Family History Family History  Problem Relation Age of Onset  . Hypertension Mother   . Cancer Father   . Hypertension Father   . Cancer Brother   . Diabetes Maternal Uncle     Social History Social History  Substance Use Topics  . Smoking status: Former Games developer  . Smokeless tobacco: Never Used  . Alcohol use 0.0 oz/week     Allergies   Esomeprazole magnesium   Review of Systems Review of Systems  Constitutional: Negative for chills and fever.  HENT: Negative for  ear pain and sore throat.   Eyes: Negative for pain and visual disturbance.  Respiratory: Positive for shortness of breath. Negative for cough and chest tightness.   Cardiovascular: Positive for chest pain and leg swelling. Negative for palpitations.  Gastrointestinal: Negative for abdominal pain and vomiting.  Genitourinary: Negative for dysuria and hematuria.  Musculoskeletal: Negative for arthralgias and back pain.  Skin: Negative for color change and rash.  Neurological: Negative for seizures and syncope.  All other systems reviewed and are negative.    Physical Exam Updated Vital Signs BP 119/90   Pulse 86   Temp 97.6 F (36.4 C) (Oral)   Resp 15   Ht  (1.651 m)   Wt 70.3 kg   LMP 02/25/2017   SpO2 100%   BMI 25.79 kg/m   Physical Exam  Constitutional: She appears well-developed and well-nourished. No distress.  HENT:  Head: Normocephalic and atraumatic.  Eyes: Conjunctivae and EOM are normal.  Neck: Neck supple.  Cardiovascular: Normal rate and regular rhythm.   No murmur heard. Pulmonary/Chest: Effort normal and breath sounds normal. No respiratory distress.  Abdominal: Soft. There is no tenderness.  Musculoskeletal: She exhibits no edema.  Neurological: She is alert.  Skin: Skin is warm and dry.     Psychiatric: She has a normal mood and affect.  Nursing note and vitals reviewed.    ED Treatments / Results  Labs (all labs ordered are listed, but only abnormal results are displayed) Labs Reviewed  CBC  BASIC METABOLIC PANEL    EKG  EKG Interpretation None       Radiology Ct Angio Chest Pe W Or Wo Contrast  Result Date: 03/05/2017 CLINICAL DATA:  Chest pain for 4 days.  Deep venous thrombosis. EXAM: CT ANGIOGRAPHY CHEST WITH CONTRAST TECHNIQUE: Multidetector CT imaging of the chest was performed using the standard protocol during bolus administration of intravenous contrast. Multiplanar CT image reconstructions and MIPs were obtained to  evaluate the vascular anatomy. CONTRAST:  100 cc Isovue 370. COMPARISON:  None. FINDINGS: Cardiovascular: The study is positive for pulmonary embolus. Clot is best seen in a descending interlobar branch on the left. A small clot is also seen in a segmental branch to the right lower lobe. There is no evidence of right heart strain with an RV to LV ratio of 0.73. No pericardial effusion. Heart size is normal. No calcific atherosclerosis. Mediastinum/Nodes: No enlarged mediastinal, hilar, or axillary lymph nodes. Thyroid gland, trachea, and esophagus demonstrate no significant findings. Lungs/Pleura: Lungs are clear. No pleural effusion or pneumothorax. No pulmonary infarct. Upper Abdomen: Negative. Musculoskeletal: Negative. Review of the MIP images confirms the above findings. IMPRESSION: The examination is positive for pulmonary embolus without evidence of right heart strain. No other acute abnormality. Critical Value/emergent results were called by telephone at the time of  interpretation on 03/05/2017 at 6:29 pm Concha Norway, P.A., who verbally acknowledged these results. Electronically Signed   By: Drusilla Kanner M.D.   On: 03/05/2017 18:31   Dg Foot Complete Left  Result Date: 03/05/2017 Please see detailed radiograph report in office note.   Procedures Procedures (including critical care time)  Medications Ordered in ED Medications  apixaban (ELIQUIS) tablet 10 mg (10 mg Oral Given 03/05/17 2042)    Followed by  apixaban (ELIQUIS) tablet 5 mg (not administered)     Initial Impression / Assessment and Plan / ED Course  I have reviewed the triage vital signs and the nursing notes.  Pertinent labs & imaging results that were available during my care of the patient were reviewed by me and considered in my medical decision making (see chart for details).     31 year old female with recent bunion surgery who presents in the setting of pulmonary embolus and DVT. Patient reports she's had  some mild chest discomfort and shortness of breath or last for days but follow up at pre-existing podiatry appointment today. Podiatrist noted that patient had swelling to left calf and DVT study revealed DVT. Patient went to primary care physician to obtain CT scan which revealed acute pulmonary embolus. Patient came to emergency department for further dilation.  On arrival patient hemodynamically stable and afebrile. Patient resting comfortably and denies any chest pain or shortness of breath at this time. CT scan revealed no signs of right heart strain. Patient otherwise healthy and has no significant comorbidities. At this time believe patient is candidate for outpatient anticoagulation in setting of provoked DVT/PE.  Patient given eliquis in emergency department and educated by pharmacy team. [facility] discharge home with eliquis and plan a follow up PCP. Strict return discussions given a patient stable at time of discharge.  Final Clinical Impressions(s) / ED Diagnoses   Final diagnoses:  Acute deep vein thrombosis (DVT) of distal vein of left lower extremity (HCC)  Other acute pulmonary embolism without acute cor pulmonale (HCC)    New Prescriptions New Prescriptions   APIXABAN (ELIQUIS STARTER PACK) 5 MG TABS TABLET    Take 1 tablet (5 mg total) by mouth 2 (two) times daily.     Stacy Gardner, MD 03/05/17 1610    Gerhard Munch, MD 03/05/17 [phone]

## 2017-03-05 NOTE — ED Triage Notes (Signed)
Per Pt, Pt had a Bunionectomy one week ago. Pt had her one week post-op check up today when she reported to have some calf pain. Pt was positive for DVT and positive for PE. Reports some SOB.

## 2017-03-05 NOTE — Progress Notes (Signed)
Subjective: Stacy Wilkins is a 31 y.o. female patient seen today in office for POV #1 (DOS 02-25-17), S/P left [place] bunionectomy. Patient denies pain at surgical site, admits calf pain, denies headache, chest pain, shortness of breath, nausea, vomiting, fever, or chills. Patient states that she is doing well and has not taken any medications. Patient states that calf pain is hurting more though over the last 3 days. No other issues noted.   Patient Active Problem List   Diagnosis Date Noted  . Skin lesion 02/18/2017  . Dysfunction of both eustachian tubes 06/14/2016  . Referred otalgia of left ear 06/14/2016  . Tinnitus of both ears 06/14/2016  . Palpitations 02/27/2016  . Hemorrhoids 09/29/2015  . Routine general medical examination at a health care facility 09/29/2015  . Encounter for general adult medical examination without abnormal findings 09/29/2015  . Migraine without aura and responsive to treatment 02/15/2015  . Migraine without aura and without status migrainosus, not intractable 02/15/2015    Current Outpatient Prescriptions on File Prior to Visit  Medication Sig Dispense Refill  . ALPRAZolam (XANAX) 0.25 MG tablet Take 1 tablet (0.25 mg total) by mouth as needed for anxiety (panic attacks). Do not take more than two tablets per day. 20 tablet 0  . benzonatate (TESSALON) 100 MG capsule Take 1-2 capsules (100-200 mg total) by mouth 3 (three) times daily as needed for cough. (Patient not taking: Reported on 02/18/2017) 40 capsule 0  . cephALEXin (KEFLEX) 500 MG capsule Take 1 capsule (500 mg total) by mouth 2 (two) times daily. For a total of 7 days. 14 capsule 0  . chlorpheniramine-HYDROcodone (TUSSIONEX PENNKINETIC ER) 10-8 MG/5ML SUER Take 5 mLs by mouth every 12 (twelve) hours as needed for cough. (Patient not taking: Reported on 02/18/2017) 100 mL 0  . doxycycline (PERIOSTAT) 20 MG tablet     . IRON PO Take by mouth.    Lenda Kelp FE 1/20 1-20 MG-MCG tablet Take 1 tablet by  mouth daily.   6  . Multiple Vitamins-Minerals (MULTIVITAMIN WITH MINERALS) tablet Take 1 tablet by mouth daily.    . Multiple Vitamins-Minerals (THERA-M) TABS Take by mouth.    . mupirocin ointment (BACTROBAN) 2 % Place 1 application into the nose 3 (three) times daily. 22 g 0  . omeprazole (PRILOSEC) 40 MG capsule Take 40 mg by mouth daily.  2  . propranolol (INDERAL) 10 MG tablet Take by mouth.    . sertraline (ZOLOFT) 50 MG tablet Week 1: Take one tablet daily Week 2: Take two tablets daily Week 3 and 4: Take three tablets daily (Patient taking differently: Take 100 mg by mouth daily. ) 90 tablet 0  . valACYclovir (VALTREX) 500 MG tablet Take 500 mg by mouth daily as needed.   6   Current Facility-Administered Medications on File Prior to Visit  Medication Dose Route Frequency Provider Last Rate Last Dose  . triamcinolone acetonide (KENALOG) 10 MG/ML injection 10 mg  10 mg Other Once Landis Martins, DPM        Allergies  Allergen Reactions  . Esomeprazole Magnesium Other (See Comments)    Other Other reaction(s): Unknown    Objective: There were no vitals filed for this visit.  General: No acute distress, AAOx3  Left foot: Sutures intact with no gapping or dehiscence at surgical site, mild swelling to left forefoot, no erythema, no warmth, no drainage, no signs of infection noted, Capillary fill time <3 seconds in all digits, gross sensation present via light  touch to left foot. No pain or crepitation with range of motion left foot.  No pain with calf compression however subjective painful cramping.   Post Op Xray,Left foot: 1st met Excellent alignment and position. Osteotomy site healing. Hardware intact. Soft tissue swelling within normal limits for post op status.   Assessment and Plan:  Problem List Items Addressed This Visit    None    Visit Diagnoses    Edema leg    -  Primary   Relevant Orders   VAS [place] LOWER EXTREMITY VENOUS (DVT)   S/P foot surgery, left        Relevant Orders   DG Foot Complete Left (Completed)   VAS [place] LOWER EXTREMITY VENOUS (DVT)       -Patient seen and evaluated -Applied dry sterile dressing to surgical site left foot secured with ACE wrap and stockinet  -Advised patient to make sure to keep dressings clean, dry, and intact to left surgical site, removing the ACE as needed  -Advised patient to continue with CAM boot and crutches until next week -Advised patient to limit activity to necessity  -Advised patient to ice and elevate as necessary  -Rx Venous duplex r/o DVT if negative will send Flexeril to pharmacy for calf cramps -Meanwhile since patient is on birth control advised full dose of aspirin as preventative measures against DVT while in CAM boot -Will plan for suture removal at next office visit. In the meantime, patient to call office if any issues or problems arise.   Landis Martins, DPM

## 2017-03-05 NOTE — Discharge Instructions (Addendum)
Information on my medicine - ELIQUIS (apixaban)  This medication education was reviewed with me or my healthcare representative as part of my discharge preparation.  Why was Eliquis prescribed for you? Eliquis was prescribed for you to reduce the risk of forming blood clots that can cause a stroke if you have a medical condition called atrial fibrillation (a type of irregular heartbeat) OR to reduce the risk of a blood clots forming after orthopedic surgery.  What do You need to know about Eliquis ? Take your Eliquis TWICE DAILY - one tablet in the morning and one tablet in the evening with or without food.  It would be best to take the doses about the same time each day.  If you have difficulty swallowing the tablet whole please discuss with your pharmacist how to take the medication safely.  Take Eliquis exactly as prescribed by your doctor and DO NOT stop taking Eliquis without talking to the doctor who prescribed the medication.  Stopping may increase your risk of developing a new clot or stroke.  Refill your prescription before you run out.  After discharge, you should have regular check-up appointments with your healthcare provider that is prescribing your Eliquis.  In the future your dose may need to be changed if your kidney function or weight changes by a significant amount or as you get older.  What do you do if you miss a dose? If you miss a dose, take it as soon as you remember on the same day and resume taking twice daily.  Do not take more than one dose of ELIQUIS at the same time.  Important Safety Information A possible side effect of Eliquis is bleeding. You should call your healthcare provider right away if you experience any of the following: ? Bleeding from an injury or your nose that does not stop. ? Unusual colored urine (red or dark brown) or unusual colored stools (red or black). ? Unusual bruising for unknown reasons. ? A serious fall or if you hit your head  (even if there is no bleeding).  Some medicines may interact with Eliquis and might increase your risk of bleeding or clotting while on Eliquis. To help avoid this, consult your healthcare provider or pharmacist prior to using any new prescription or non-prescription medications, including herbals, vitamins, non-steroidal anti-inflammatory drugs (NSAIDs) and supplements.  This website has more information on Eliquis (apixaban): www.[employer].com.cy.  Information on my medicine - ELIQUIS (apixaban)  This medication education was reviewed with me or my healthcare representative as part of my discharge preparation.  The pharmacist that spoke with me during my hospital stay was:  Sherron Monday, RPH  Why was Eliquis prescribed for you? Eliquis was prescribed to treat blood clots that may have been found in the veins of your legs (deep vein thrombosis) or in your lungs (pulmonary embolism) and to reduce the risk of them occurring again.  What do You need to know about Eliquis ? The starting dose is 10 mg (two 5 mg tablets) taken TWICE daily for the FIRST SEVEN (7) DAYS, then on 03/12/2017  the dose is reduced to ONE 5 mg tablet taken TWICE daily.  Eliquis may be taken with or without food.   Try to take the dose about the same time in the morning and in the evening. If you have difficulty swallowing the tablet whole please discuss with your pharmacist how to take the medication safely.  Take Eliquis exactly as prescribed and DO NOT stop taking  Eliquis without talking to the doctor who prescribed the medication.  Stopping may increase your risk of developing a new blood clot.  Refill your prescription before you run out.  After discharge, you should have regular check-up appointments with your healthcare provider that is prescribing your Eliquis.    What do you do if you miss a dose? If a dose of ELIQUIS is not taken at the scheduled time, take it as soon as possible on the same day and  twice-daily administration should be resumed. The dose should not be doubled to make up for a missed dose.  Important Safety Information A possible side effect of Eliquis is bleeding. You should call your healthcare provider right away if you experience any of the following: ? Bleeding from an injury or your nose that does not stop. ? Unusual colored urine (red or dark brown) or unusual colored stools (red or black). ? Unusual bruising for unknown reasons. ? A serious fall or if you hit your head (even if there is no bleeding).  Some medicines may interact with Eliquis and might increase your risk of bleeding or clotting while on Eliquis. To help avoid this, consult your healthcare provider or pharmacist prior to using any new prescription or non-prescription medications, including herbals, vitamins, non-steroidal anti-inflammatory drugs (NSAIDs) and supplements.  This website has more information on Eliquis (apixaban): http://www.eliquis.com/eliquis/home

## 2017-03-05 NOTE — Patient Instructions (Signed)
     IF you received an x-ray today, you will receive an invoice from [facility]. Please contact [facility] at [phone] with questions or concerns regarding your invoice.   IF you received labwork today, you will receive an invoice from [facility]. Please contact [facility] at [phone] with questions or concerns regarding your invoice.   Our billing staff will not be able to assist you with questions regarding bills from these companies.  You will be contacted with the lab results as soon as they are available. The fastest way to get your results is to activate your My Chart account. Instructions are located on the last page of this paperwork. If you have not heard from us regarding the results in 2 weeks, please contact this office.     

## 2017-03-05 NOTE — Progress Notes (Signed)
Venous duplex ordered

## 2017-03-05 NOTE — Progress Notes (Signed)
ANTICOAGULATION CONSULT NOTE - Initial Consult  Pharmacy Consult for apixaban Indication: DVT/PE  Patient Measurements: Height: 5\' 5"  (165.1 cm) Weight: 155 lb (70.3 kg) IBW/kg (Calculated) : 57  Vital Signs: Temp: 97.6 F (36.4 C) (04/10 1843) Temp Source: Oral (04/10 1843) BP: 136/100 (04/10 1843) Pulse Rate: 107 (04/10 1843)  Labs: No results for input(s): HGB, HCT, PLT, APTT, LABPROT, INR, HEPARINUNFRC, HEPRLOWMOCWT, CREATININE, CKTOTAL, CKMB, TROPONINI in the last 72 hours.  CrCl cannot be calculated (Patient's most recent lab result is older than the maximum 21 days allowed.).  Medical History: Past Medical History:  Diagnosis Date  . Anxiety   . Depression   . Generalized headaches   . GERD (gastroesophageal reflux disease)   . Hyperlipidemia   . Migraine   . Ulcer [facility])    Assessment: 31 yo female s/p bunionectomy with new provoked DVT/PE. Pharmacy consulted for apixaban.  Goal of Therapy:  Monitor platelets by anticoagulation protocol: Yes   Plan:  Apixaban  BID for 7 days, followed by apixaban  BID after Counsel on appropriate use  Sherron Monday 03/05/2017,7:57 PM

## 2017-03-06 ENCOUNTER — Telehealth: Payer: Self-pay | Admitting: *Deleted

## 2017-03-06 ENCOUNTER — Telehealth: Payer: Self-pay | Admitting: Physician Assistant

## 2017-03-06 MED FILL — ELIQUIS STARTER PACK 5 MG T: 5 | 30 days supply | Qty: 74 | Fill #0

## 2017-03-06 NOTE — Telephone Encounter (Signed)
Pt called stating she could not find Rx as written at any [facility] or [facility] pharmacy.  EDCM contacted [facility] to find that they have it in stock.  Relayed information to pt via voicemail.

## 2017-03-06 NOTE — Telephone Encounter (Signed)
Pharmacy called related to Rx: Eliquis starter pack .Marland KitchenMarland KitchenEDCM clarified with EDP (lawyer) to dispense as written as dose was advised by [facility] pharmacy.

## 2017-03-06 NOTE — Telephone Encounter (Signed)
Spoke with patient regarding ED visit. She states she is feeling less anxious today and her SOB and chest pain have resolved. She was given starter pack for eliquis and informed to follow up with PCP. She was wondering when she should follow up with me. I instructed her to return in 2 weeks for Rx refill on eliquis as the ED only gave her 30 days worth and she will need to be on it for at least 3 months.

## 2017-03-07 ENCOUNTER — Telehealth: Payer: Self-pay | Admitting: Physician Assistant

## 2017-03-07 NOTE — Telephone Encounter (Signed)
Any concerns? Side effects from med?

## 2017-03-07 NOTE — Telephone Encounter (Signed)
Thanks for the update! -Dr. Stover 

## 2017-03-07 NOTE — Telephone Encounter (Signed)
Pt calling again stating that she just ate and is feeling nauseous this is her 2nd day on eliquis  please respond

## 2017-03-07 NOTE — Telephone Encounter (Signed)
(  I COULD NOT ATTACH THIS MESSAGE TO BRITTANY'S MESSAGE FROM YESTERDAY (03/06/17) BECAUSE IT WAS CLOSED) PATIENT STATES SHE SPOKE WITH BRITTANY WED. AND BRITTANY TOLD HER TO CALL BACK IF SHE HAD ANY MORE QUESTIONS OR CONCERNS. SHE WANTS BRITTANY TO KNOW THAT TODAY SHE HAS A NAGGING HEADACHE AND A COUGH. SHE IS NOT SURE IF IT IS THE BLOOD THINNER OR HER BODY IS JUST GETTING USE TO TAKING IT? SHE WAS DIAGNOSED WITH A PULMONARY EMBOLISM. BEST PHONE [phone] (CELL) [facility]AC[facility] CHOICE IS [facility] [address]. MBC

## 2017-03-08 NOTE — Telephone Encounter (Signed)
Bruising can worsen after starting a blood thinner. Ice and elevation should help it resolve -Dr. Marylene Land

## 2017-03-11 NOTE — Telephone Encounter (Signed)
Called pt on 03/08/17 and discussed that nausea is a common side effect of eliquis. She mentioned that it was getting better. Instructed that if it worsens, please return to office for further evaluation.

## 2017-03-12 ENCOUNTER — Ambulatory Visit (INDEPENDENT_AMBULATORY_CARE_PROVIDER_SITE_OTHER): Payer: BLUE CROSS/BLUE SHIELD | Admitting: Sports Medicine

## 2017-03-12 DIAGNOSIS — Z9889 Other specified postprocedural states: Secondary | ICD-10-CM

## 2017-03-12 DIAGNOSIS — I82402 Acute embolism and thrombosis of unspecified deep veins of left lower extremity: Secondary | ICD-10-CM

## 2017-03-12 NOTE — Progress Notes (Signed)
Subjective: Stacy Wilkins is a 31 y.o. female patient seen today in office for POV #2 (DOS 02-25-17), S/P left austin bunionectomy. Patient denies pain at surgical site, admits calf pain is better on Eliquis for DVT/PE, denies headache, chest pain, shortness of breath, nausea, vomiting, fever, or chills.  No other issues noted.   Patient Active Problem List   Diagnosis Date Noted  . Skin lesion 02/18/2017  . Dysfunction of both eustachian tubes 06/14/2016  . Referred otalgia of left ear 06/14/2016  . Tinnitus of both ears 06/14/2016  . Palpitations 02/27/2016  . Hemorrhoids 09/29/2015  . Routine general medical examination at a health care facility 09/29/2015  . Encounter for general adult medical examination without abnormal findings 09/29/2015  . Migraine without aura and responsive to treatment 02/15/2015  . Migraine without aura and without status migrainosus, not intractable 02/15/2015    Current Outpatient Prescriptions on File Prior to Visit  Medication Sig Dispense Refill  . ALPRAZolam (XANAX) 0.25 MG tablet Take 1 tablet (0.25 mg total) by mouth as needed for anxiety (panic attacks). Do not take more than two tablets per day. 20 tablet 0  . apixaban (ELIQUIS STARTER PACK) 5 MG TABS tablet Take 1 tablet (5 mg total) by mouth 2 (two) times daily. 60 tablet 0  . cephALEXin (KEFLEX) 500 MG capsule Take 1 capsule (500 mg total) by mouth 2 (two) times daily. For a total of 7 days. (Patient not taking: Reported on 03/05/2017) 14 capsule 0  . doxycycline (PERIOSTAT) 20 MG tablet     . IRON PO Take by mouth.    Colleen Can FE 1/20 1-20 MG-MCG tablet Take 1 tablet by mouth daily.   6  . Multiple Vitamins-Minerals (MULTIVITAMIN WITH MINERALS) tablet Take 1 tablet by mouth daily.    . Multiple Vitamins-Minerals (THERA-M) TABS Take by mouth.    . mupirocin ointment (BACTROBAN) 2 % Place 1 application into the nose 3 (three) times daily. 22 g 0  . omeprazole (PRILOSEC) 40 MG capsule Take 40 mg by  mouth daily.  2  . propranolol (INDERAL) 10 MG tablet Take by mouth.    . sertraline (ZOLOFT) 50 MG tablet Week 1: Take one tablet daily Week 2: Take two tablets daily Week 3 and 4: Take three tablets daily (Patient taking differently: Take 100 mg by mouth daily. ) 90 tablet 0  . valACYclovir (VALTREX) 500 MG tablet Take 500 mg by mouth daily as needed.   6   Current Facility-Administered Medications on File Prior to Visit  Medication Dose Route Frequency Provider Last Rate Last Dose  . triamcinolone acetonide (KENALOG) 10 MG/ML injection 10 mg  10 mg Other Once Asencion Islam, DPM        Allergies  Allergen Reactions  . Esomeprazole Magnesium Other (See Comments)    Other Other reaction(s): Unknown    Objective: There were no vitals filed for this visit.  General: No acute distress, AAOx3  Left foot: Sutures intact with no gapping or dehiscence at surgical site, mild swelling and bruising to left forefoot, no erythema, no warmth, no drainage, no signs of infection noted, Capillary fill time <3 seconds in all digits, gross sensation present via light touch to left foot. No pain or crepitation with range of motion left foot.  No pain with calf compression.   Assessment and Plan:  Problem List Items Addressed This Visit    None    Visit Diagnoses    S/P foot surgery, left    -  Primary   Acute deep vein thrombosis (DVT) of left lower extremity, unspecified vein (HCC)       On Eliquis       -Patient seen and evaluated -Sutures removed and Applied dry sterile dressing to surgical site left foot secured with coban wrap and stockinet  -Advised patient to make sure to keep dressings clean, dry, and intact to left surgical site, today and may shower as normal on tomorrow -May d/c crutches -Advised patient to limit activity to necessity with WB with CAM boot -Advised patient to ice and elevate as necessary  -Continue Eliquis and PCP follow up  -Will plan for xray and transitioning  out of boot to post op shoe at next office visit. In the meantime, patient to call office if any issues or problems arise.   Asencion Islam, DPM

## 2017-03-19 ENCOUNTER — Ambulatory Visit (INDEPENDENT_AMBULATORY_CARE_PROVIDER_SITE_OTHER): Payer: BLUE CROSS/BLUE SHIELD

## 2017-03-19 ENCOUNTER — Ambulatory Visit (INDEPENDENT_AMBULATORY_CARE_PROVIDER_SITE_OTHER): Payer: BLUE CROSS/BLUE SHIELD | Admitting: Physician Assistant

## 2017-03-19 VITALS — BP 132/81 | HR 101 | Temp 98.3°F | Resp 18 | Ht 65.0 in | Wt 156.0 lb

## 2017-03-19 DIAGNOSIS — Z86711 Personal history of pulmonary embolism: Secondary | ICD-10-CM

## 2017-03-19 DIAGNOSIS — Z3009 Encounter for other general counseling and advice on contraception: Secondary | ICD-10-CM | POA: Diagnosis not present

## 2017-03-19 DIAGNOSIS — Z86718 Personal history of other venous thrombosis and embolism: Secondary | ICD-10-CM | POA: Diagnosis not present

## 2017-03-19 DIAGNOSIS — R0781 Pleurodynia: Secondary | ICD-10-CM | POA: Diagnosis not present

## 2017-03-19 MED ORDER — APIXABAN 5 MG PO TABS: 5.0000 mg | ORAL_TABLET | Freq: Two times a day (BID) | ORAL | 0 refills | Status: DC

## 2017-03-19 NOTE — Progress Notes (Signed)
MRN: [id number] DOB: [birth year]  Subjective:   Stacy Wilkins is a 31 y.o. female presenting for follow up on provoked DVT and PE s/p left austin bunionectomy. Dx on 03/05/17. Was discharged from the ED and started on Eliquis. She has one week left of her 30 day Eliquis starter pack. Admits great compliance with medication. Was originally experiencing nausea but that has resolved after the first week of taking the medication. She has noticed minimal bruising on her lower legs and has had a slight dry cough since starting the medication. She has also been having right sided anterior rib pain for the past week. Denies acute trauma, SOB, chest pain, lower leg swelling, calf tenderness, headache, syncope, and overt bleeding. Pt has discontinued oral contraceptive since DVT/PE dx but would like to discuss contraceptive options. Notes she is interested in the nexplanon. She does not want an IUD.  Pt is not currently sexually active but does believe she will be soon. She would also like to get this all taken care of before June when her insurance renews.   Stacy Wilkins has a current medication list which includes the following prescription(s): alprazolam, doxycycline, iron, multivitamin with minerals, thera-m, mupirocin ointment, omeprazole, propranolol, sertraline, valacyclovir, and apixaban, and the following Facility-Administered Medications: triamcinolone acetonide. Also is allergic to esomeprazole magnesium.  Stacy Wilkins  has a past medical history of Anxiety; Depression; Generalized headaches; GERD (gastroesophageal reflux disease); Hyperlipidemia; Migraine; and Ulcer. Also  has a past surgical history that includes Tonsillectomy and adenoidectomy; Eye surgery; and bunionectomy .   Objective:   Vitals: BP 132/81   Pulse (!) 101   Temp 98.3 F (36.8 C) (Oral)   Resp 18   Ht _0  (1.651 m)   Wt 156 lb (70.8 kg)   LMP 02/25/2017   SpO2 98%   BMI 25.96 kg/m   Physical Exam  Constitutional: She is  oriented to person, place, and time. She appears well-developed and well-nourished. No distress.  HENT:  Head: Normocephalic and atraumatic.  Eyes: Conjunctivae are normal.  Neck: Normal range of motion.  Cardiovascular: Normal rate, regular rhythm, normal heart sounds and intact distal pulses.   Pulmonary/Chest: Effort normal and breath sounds normal.    Musculoskeletal:       Right lower leg: Normal.       Left lower leg: Normal. She exhibits no tenderness, no swelling, no edema and no deformity.  CAM walker noted on left lower extremity. Removed for examination.   Neurological: She is alert and oriented to person, place, and time.  Skin: Skin is warm and dry.  One ~5cm in size noted on lateral aspect of right thigh.  Psychiatric: She has a normal mood and affect.  Vitals reviewed.   Dg Ribs Unilateral W/chest Right  Result Date: 03/19/2017 CLINICAL DATA:  Anterior RIGHT rib pain for 5 days. History of pulmonary embolism. EXAM: RIGHT RIBS AND CHEST - 3+ VIEW COMPARISON:  CT chest March 05, 2017. FINDINGS: No fracture or other bone lesions are seen involving the ribs. There is no evidence of pneumothorax or pleural effusion. Both lungs are clear. Heart size and mediastinal contours are within normal limits. Mild thoracolumbar dextroscoliosis. IMPRESSION: Negative. Electronically Signed   By: Elon Alas M.D.   On: 03/19/2017 18:49    No results found for this or any previous visit (from the past 24 hour(s)).  Assessment and Plan :  1. History of pulmonary embolism Pt is doing well on Eliquis. Will treat with Eliquis for  a duration of 3 months. Plan for follow up in 1 month in office with.  - CBC - CMP14+EGFR - apixaban (ELIQUIS) 5 MG TABS tablet; Take 1 tablet (5 mg total) by mouth 2 (two) times daily.  Dispense: 120 tablet; Refill: 0 2. History of DVT (deep vein thrombosis) - apixaban (ELIQUIS) 5 MG TABS tablet; Take 1 tablet (5 mg total) by mouth 2 (two) times daily.   Dispense: 120 tablet; Refill: 0  3. Rib pain on right side Plain films show no acute abnormality. Instructed to use ice to affected area. Return to clinic if symptoms worsen, do not improve in 7-10 days, or as needed - DG Ribs Unilateral W/Chest Right; Future  4. General counseling and advice on contraceptive management Due to personal hx of PE and DVT, current treatment with Eliquis, and FH of DVTs in mother, pt would benefit from further counseling on contraceptive management by gynecology.  - Ambulatory referral to Gynecology  Stacy Delaine, PA-C  [facility] 03/19/2017 6:52 PM

## 2017-03-19 NOTE — Patient Instructions (Addendum)
Your xray is negative today which is great. Apply ice to the affected area 4-5x a day until pain resolves, if pain persists, please return for further evaluation.   Continue eliquis as prescribed. Let's plan for follow up in one month for recheck as long as everything is going well. If anything changes or any new symptoms occur, return to clinic sooner for reevaluation.   For birth control management, I have put in a referral for gynecology so they can assist you with the best, safest option for you.   I will contact you with lab results. Your labs will be completed in a few days. Please let me know if you have any questions in the meantime.   Thank you for letting me participate in your health and well being.    IF you received an x-ray today, you will receive an invoice from [facility]. Please contact [facility] at [phone] with questions or concerns regarding your invoice.   IF you received labwork today, you will receive an invoice from [facility]. Please contact [facility] at [phone] with questions or concerns regarding your invoice.   Our billing staff will not be able to assist you with questions regarding bills from these companies.  You will be contacted with the lab results as soon as they are available. The fastest way to get your results is to activate your My Chart account. Instructions are located on the last page of this paperwork. If you have not heard from [place] regarding the results in 2 weeks, please contact this office.

## 2017-03-20 LAB — CMP14+EGFR
ALT: 26 IU/L (ref 0–32)
AST: 26 IU/L (ref 0–40)
Albumin/Globulin Ratio: 2.2 (ref 1.2–2.2)
Albumin: 4.8 g/dL (ref 3.5–5.5)
Alkaline Phosphatase: 69 IU/L (ref 39–117)
BUN/Creatinine Ratio: 16 (ref 9–23)
BUN: 11 mg/dL (ref 6–20)
Bilirubin Total: 0.3 mg/dL (ref 0.0–1.2)
CO2: 23 mmol/L (ref 18–29)
Calcium: 9.8 mg/dL (ref 8.7–10.2)
Chloride: 99 mmol/L (ref 96–106)
Creatinine, Ser: 0.67 mg/dL (ref 0.57–1.00)
GFR calc Af Amer: 136 mL/min/{1.73_m2} (ref >59)
GFR calc non Af Amer: 118 mL/min/{1.73_m2} (ref >59)
Globulin, Total: 2.2 g/dL (ref 1.5–4.5)
Glucose: 95 mg/dL (ref 65–99)
Potassium: 4.5 mmol/L (ref 3.5–5.2)
Sodium: 139 mmol/L (ref 134–144)
Total Protein: 7 g/dL (ref 6.0–8.5)

## 2017-03-20 LAB — CBC
Hematocrit: 42.5 % (ref 34.0–46.6)
Hemoglobin: 14.4 g/dL (ref 11.1–15.9)
MCH: 29.7 pg (ref 26.6–33.0)
MCHC: 33.9 g/dL (ref 31.5–35.7)
MCV: 88 fL (ref 79–97)
Platelets: 263 10*3/uL (ref 150–379)
RBC: 4.85 x10E6/uL (ref 3.77–5.28)
RDW: 13.1 % (ref 12.3–15.4)
WBC: 8.4 10*3/uL (ref 3.4–10.8)

## 2017-03-21 DIAGNOSIS — Z86718 Personal history of other venous thrombosis and embolism: Secondary | ICD-10-CM | POA: Insufficient documentation

## 2017-03-21 DIAGNOSIS — Z86711 Personal history of pulmonary embolism: Secondary | ICD-10-CM | POA: Insufficient documentation

## 2017-03-25 DIAGNOSIS — Z86711 Personal history of pulmonary embolism: Secondary | ICD-10-CM | POA: Diagnosis not present

## 2017-03-25 DIAGNOSIS — Z3009 Encounter for other general counseling and advice on contraception: Secondary | ICD-10-CM | POA: Diagnosis not present

## 2017-03-25 DIAGNOSIS — Z130 Encounter for screening for diseases of the blood and blood-forming organs and certain disorders involving the immune mechanism: Secondary | ICD-10-CM | POA: Diagnosis not present

## 2017-03-26 ENCOUNTER — Ambulatory Visit (INDEPENDENT_AMBULATORY_CARE_PROVIDER_SITE_OTHER): Payer: BLUE CROSS/BLUE SHIELD

## 2017-03-26 ENCOUNTER — Telehealth: Payer: Self-pay | Admitting: Sports Medicine

## 2017-03-26 ENCOUNTER — Encounter: Payer: Self-pay | Admitting: Sports Medicine

## 2017-03-26 ENCOUNTER — Ambulatory Visit (INDEPENDENT_AMBULATORY_CARE_PROVIDER_SITE_OTHER): Payer: BLUE CROSS/BLUE SHIELD | Admitting: Sports Medicine

## 2017-03-26 ENCOUNTER — Encounter: Payer: Self-pay | Admitting: Obstetrics and Gynecology

## 2017-03-26 DIAGNOSIS — Z9889 Other specified postprocedural states: Secondary | ICD-10-CM | POA: Diagnosis not present

## 2017-03-26 NOTE — Progress Notes (Signed)
Subjective: Stacy Wilkins is a 31 y.o. female patient seen today in office for POV #3 (DOS 02-25-17), S/P left austin bunionectomy. Patient denies pain at surgical site, admits calf pain is better on Eliquis for DVT/PE x 3 months, denies headache, chest pain, shortness of breath, nausea, vomiting, fever, or chills.  No other issues noted.   Patient Active Problem List   Diagnosis Date Noted  . History of DVT (deep vein thrombosis) 03/21/2017  . History of pulmonary embolism 03/21/2017  . Skin lesion 02/18/2017  . Dysfunction of both eustachian tubes 06/14/2016  . Referred otalgia of left ear 06/14/2016  . Tinnitus of both ears 06/14/2016  . Palpitations 02/27/2016  . Hemorrhoids 09/29/2015  . Routine general medical examination at a health care facility 09/29/2015  . Encounter for general adult medical examination without abnormal findings 09/29/2015  . Migraine without aura and responsive to treatment 02/15/2015  . Migraine without aura and without status migrainosus, not intractable 02/15/2015    Current Outpatient Prescriptions on File Prior to Visit  Medication Sig Dispense Refill  . ALPRAZolam (XANAX) 0.25 MG tablet Take 1 tablet (0.25 mg total) by mouth as needed for anxiety (panic attacks). Do not take more than two tablets per day. 20 tablet 0  . apixaban (ELIQUIS) 5 MG TABS tablet Take 1 tablet (5 mg total) by mouth 2 (two) times daily. 120 tablet 0  . IRON PO Take by mouth.    . Multiple Vitamins-Minerals (MULTIVITAMIN WITH MINERALS) tablet Take 1 tablet by mouth daily.    . Multiple Vitamins-Minerals (THERA-M) TABS Take by mouth.    Marland Kitchen omeprazole (PRILOSEC) 40 MG capsule Take 40 mg by mouth daily.  2  . sertraline (ZOLOFT) 50 MG tablet Week 1: Take one tablet daily Week 2: Take two tablets daily Week 3 and 4: Take three tablets daily (Patient taking differently: Take 100 mg by mouth daily. ) 90 tablet 0  . valACYclovir (VALTREX) 500 MG tablet Take 500 mg by mouth daily as  needed.   6   Current Facility-Administered Medications on File Prior to Visit  Medication Dose Route Frequency Provider Last Rate Last Dose  . triamcinolone acetonide (KENALOG) 10 MG/ML injection 10 mg  10 mg Other Once Asencion Islam, DPM        Allergies  Allergen Reactions  . Esomeprazole Magnesium Other (See Comments)    Other Other reaction(s): Unknown    Objective: There were no vitals filed for this visit.  General: No acute distress, AAOx3  Left foot: Incision site well healed, mild swelling and bruising to left forefoot, no erythema, no warmth, no drainage, no signs of infection noted, Capillary fill time <3 seconds in all digits, gross sensation present via light touch to left foot. No pain or crepitation with range of motion left foot.  No pain with calf compression.   Xray, Left- hardware intact consistent with post op status   Assessment and Plan:  Problem List Items Addressed This Visit    None    Visit Diagnoses    S/P foot surgery, left    -  Primary   Relevant Orders   DG Foot Complete Left       -Patient seen and evaluated -Xrays reviewed  -Encouraged range of motion and toe exercises  -Advised patient to limit activity to tolerance with post op shoe as dispensed at today's visit for 2 weeks and then to normal shoe as instructed  -Advised patient to ice and elevate as necessary  -  Continue Eliquis and PCP follow up  -Will plan for increasing activities at next office visit. In the meantime, patient to call office if any issues or problems arise.   Asencion Islam, DPM

## 2017-03-26 NOTE — Telephone Encounter (Signed)
She is fine to get a pedicure now.  Dr Marylene Land

## 2017-03-26 NOTE — Telephone Encounter (Signed)
Pt was seen this morning and forgot to ask when she can get a pedicure? Please advise

## 2017-04-04 MED ORDER — HYDROCODONE-ACETAMINOPHEN 10-325 MG PO TABS: 1.0000 | ORAL_TABLET | Freq: Four times a day (QID) | ORAL | 0 refills | Status: DC | PRN

## 2017-04-04 NOTE — Progress Notes (Signed)
DOS 02/25/2017 Left Austin Bunionectomy with screw fixation.

## 2017-04-05 DIAGNOSIS — N946 Dysmenorrhea, unspecified: Secondary | ICD-10-CM | POA: Diagnosis not present

## 2017-04-15 ENCOUNTER — Ambulatory Visit (INDEPENDENT_AMBULATORY_CARE_PROVIDER_SITE_OTHER): Payer: BLUE CROSS/BLUE SHIELD | Admitting: Physician Assistant

## 2017-04-15 ENCOUNTER — Encounter: Payer: Self-pay | Admitting: Physician Assistant

## 2017-04-15 VITALS — BP 113/74 | HR 98 | Temp 98.4°F | Resp 16 | Ht 65.16 in | Wt 157.4 lb

## 2017-04-15 DIAGNOSIS — Z86718 Personal history of other venous thrombosis and embolism: Secondary | ICD-10-CM

## 2017-04-15 DIAGNOSIS — R0602 Shortness of breath: Secondary | ICD-10-CM

## 2017-04-15 DIAGNOSIS — J309 Allergic rhinitis, unspecified: Secondary | ICD-10-CM

## 2017-04-15 DIAGNOSIS — F411 Generalized anxiety disorder: Secondary | ICD-10-CM

## 2017-04-15 DIAGNOSIS — R079 Chest pain, unspecified: Secondary | ICD-10-CM

## 2017-04-15 DIAGNOSIS — Z86711 Personal history of pulmonary embolism: Secondary | ICD-10-CM

## 2017-04-15 DIAGNOSIS — M9904 Segmental and somatic dysfunction of sacral region: Secondary | ICD-10-CM | POA: Diagnosis not present

## 2017-04-15 DIAGNOSIS — M9901 Segmental and somatic dysfunction of cervical region: Secondary | ICD-10-CM | POA: Diagnosis not present

## 2017-04-15 DIAGNOSIS — M9902 Segmental and somatic dysfunction of thoracic region: Secondary | ICD-10-CM | POA: Diagnosis not present

## 2017-04-15 MED ORDER — FLUTICASONE PROPIONATE 50 MCG/ACT NA SUSP: 2.0000 | Freq: Every day | NASAL | 0 refills | Status: DC

## 2017-04-15 MED ORDER — CETIRIZINE HCL 10 MG PO TABS: 10.0000 mg | ORAL_TABLET | Freq: Every day | ORAL | 11 refills | Status: DC

## 2017-04-15 NOTE — Progress Notes (Signed)
MRN: [id number] DOB: 1986/07/15  Subjective:   Stacy Wilkins is a 31 y.o. female female presenting for follow up on provoked DVT and PE s/p left austin bunionectomy. Dx on 03/05/17. Was discharged from the ED and started on Eliquis.  Pt has been taking Eliquis as presribed. She did miss one tablet, 3 days ago, Note the day after this, she was sitting at home and started having some chest tightness, SOB, and mild cough. These have all improved since they presented but it concerned her so she wanted to make sure everything was okay today. Has been having associated sneezing and itching watery eyes. Has hx of allergies but has not taken anything regularly for them. She denies lower leg swelling, tenderness, redness, warmth, exertional chest pain, pleuritic chest, nausea, vomiting, difficulty swallowing, fever, dizziness, and diaphoresis. After patient had this incident, she decided to stop her anxiety medication, zoloft, in case this was interacting with Eliquis. Of note, pt was also started on Camila birth control 2 weeks ago by gynecologist. She states the gynecologist completed a hematology work up, which was negative for hypercoagulable disorders. She has continued taking Camila.   Furthermore, pt informed me that her grandmother passed away 2 weeks ago from her "heart stopping" and this has really stressed her out. She is very anxious about the possibility of death and wants to make sure her heart is okay now.   Notes the chest pain and SOB are not present today.   Stacy Wilkins has a current medication list which includes the following prescription(s): apixaban, alprazolam, cetirizine, docusate sodium, fluticasone, hydrocodone-acetaminophen, hydrocodone-acetaminophen, iron, multivitamin with minerals, thera-m, omeprazole, promethazine, sertraline, and valacyclovir, and the following Facility-Administered Medications: triamcinolone acetonide. Also is allergic to esomeprazole magnesium.  Stacy Wilkins  has a  past medical history of Anxiety; Depression; Generalized headaches; GERD (gastroesophageal reflux disease); Hyperlipidemia; Migraine; and Ulcer. Also  has a past surgical history that includes Tonsillectomy and adenoidectomy; Eye surgery; and bunionectomy .   Objective:   Vitals: BP 113/74 (BP Location: Right Arm, Patient Position: Sitting, Cuff Size: Normal)   Pulse 98 Comment: walked around X3 pulse was 98-109  Temp 98.4 F (36.9 C) (Oral)   Resp 16   Ht 5' 5.16" (1.655 m)   Wt 157 lb 5.8 oz (71.4 kg)   LMP 03/31/2017   SpO2 100% Comment: walked X3 rounds SPO2 was 99-100  BMI 26.06 kg/m   Physical Exam  Constitutional: She is oriented to person, place, and time. She appears well-developed and well-nourished.  HENT:  Head: Normocephalic and atraumatic.  Nose: Mucosal edema (moderate bilaterally) present. Right sinus exhibits no maxillary sinus tenderness and no frontal sinus tenderness. Left sinus exhibits no maxillary sinus tenderness and no frontal sinus tenderness.  Mouth/Throat: Uvula is midline, oropharynx is clear and moist and mucous membranes are normal. No tonsillar exudate.  Eyes: Conjunctivae are normal.  Neck: Normal range of motion.  Cardiovascular: Normal rate, regular rhythm, normal heart sounds, intact distal pulses and normal pulses.   Pulses:      Radial pulses are 2+ on the right side, and 2+ on the left side.       Dorsalis pedis pulses are 2+ on the right side, and 2+ on the left side.  Pulmonary/Chest: Effort normal and breath sounds normal. No respiratory distress. She has no wheezes. She has no rales. She exhibits no tenderness.  Musculoskeletal:       Right lower leg: She exhibits no tenderness and no swelling.  Left lower leg: She exhibits no tenderness and no swelling.  Calf measures 39 cm bilaterally. No erythema noted in bilateral lower extremities.   Lymphadenopathy:       Head (right side): No submental, no submandibular, no tonsillar, no  preauricular, no posterior auricular and no occipital adenopathy present.       Head (left side): No submental, no submandibular, no tonsillar, no preauricular, no posterior auricular and no occipital adenopathy present.    She has no cervical adenopathy.       Right: No supraclavicular adenopathy present.       Left: No supraclavicular adenopathy present.  Neurological: She is alert and oriented to person, place, and time.  Skin: Skin is warm and dry.  Psychiatric: Her mood appears anxious.  Vitals reviewed.  No results found for this or any previous visit (from the past 24 hour(s)).    EKG shows sinus rhythm with rate of 85 bpm. PR and QRS intervals within normal limits. No acute ST or T wave changes. Findings presented and discussed with Dr. Alvy BimlerSagardia.   Assessment and Plan :  This case was precepted with Dr. Alvy BimlerSagardia.   1. Chest pain, unspecified type Asymptomatic today. EKG, physical exam findings, and vital signs are all very reassuring. This is likely due to stress and anxiety from grandmother's recent passing. Pt is also still very anxious about her recent dx of  DVT and PE. Pt has hx of anxiety, which was controlled on zoloft, but now patient has been off of zoloft for three days. She does not want to restart it at this time in fear that it will react with Eliquis. Pt educated on the interaction and instructed to let me know if she changes her mind. Also recommended pt discontinue Camila at this time as she is also very stressed about being on this oral contraceptive. I recommended she follow up with her gynecologist and request the copper IUD, she plans to do so. Pt appears much more calm after reassurance. Recommended close follow up with me in office in 3 days. Given strict ED/return precuations both verbally and in the AVS.  - EKG 12-Lead 2. Shortness of breath Asymptomatic today. Pulse ox while ambulating remained 99-100%, pt reassured.  - Check Pulse Oximetry while ambulating 3.  Anxiety state 4. Allergic rhinitis, unspecified seasonality, unspecified trigger Untreated allergies could also be contributing to patient's symptoms. Will treat accordingly and have pt follow up with me in office in 3 days.  - fluticasone (FLONASE) 50 MCG/ACT nasal spray; Place 2 sprays into both nostrils daily.  Dispense: 16 g; Refill: 0 - cetirizine (ZYRTEC) 10 MG tablet; Take 1 tablet (10 mg total) by mouth daily.  Dispense: 30 tablet; Refill: 11  5. History of DVT (deep vein thrombosis) 6. History of pulmonary embolism Continue Eliquis as prescribed.   Benjiman CoreBrittany Wiseman, PA-C  [facility] at [facility] 04/15/2017 8:16 PM

## 2017-04-15 NOTE — Patient Instructions (Addendum)
Continue taking eliquis as prescribed. Discontinue camila birth control at this time.   I would like you to also start zyrtec and flonase daily for your allergies in hopes this will help with nasal congestion and post nasal drip. For anxiety, we can reevaluate at your follow up appointment  Follow up with me in office on Thursday.  If you start to have worsening chest pain, shortness of breath, difficulty breathing, or redness/warmth/swelling in your lower leg, please go to the ED immediately.     IF you received an x-ray today, you will receive an invoice from [facility]. Please contact [facility] at [phone] with questions or concerns regarding your invoice.   IF you received labwork today, you will receive an invoice from [facility]. Please contact [facility] at [phone] with questions or concerns regarding your invoice.   Our billing staff will not be able to assist you with questions regarding bills from these companies.  You will be contacted with the lab results as soon as they are available. The fastest way to get your results is to activate your My Chart account. Instructions are located on the last page of this paperwork. If you have not heard from us regarding the results in 2 weeks, please contact this office.

## 2017-04-18 ENCOUNTER — Ambulatory Visit: Payer: BLUE CROSS/BLUE SHIELD | Admitting: Obstetrics and Gynecology

## 2017-04-23 ENCOUNTER — Ambulatory Visit (INDEPENDENT_AMBULATORY_CARE_PROVIDER_SITE_OTHER): Payer: Self-pay | Admitting: Sports Medicine

## 2017-04-23 ENCOUNTER — Ambulatory Visit (INDEPENDENT_AMBULATORY_CARE_PROVIDER_SITE_OTHER): Payer: BLUE CROSS/BLUE SHIELD

## 2017-04-23 DIAGNOSIS — Z9889 Other specified postprocedural states: Secondary | ICD-10-CM

## 2017-04-23 DIAGNOSIS — M2012 Hallux valgus (acquired), left foot: Secondary | ICD-10-CM

## 2017-04-23 DIAGNOSIS — I82402 Acute embolism and thrombosis of unspecified deep veins of left lower extremity: Secondary | ICD-10-CM

## 2017-04-23 NOTE — Progress Notes (Signed)
Subjective: Stacy Wilkins is a 31 y.o. female patient seen today in office for POV #4 (DOS 02-25-17), S/P left austin bunionectomy. Patient denies pain at surgical site in normal shoe x 1 week, denies calf pain on Eliquis for DVT/PE, denies headache, chest pain, shortness of breath, nausea, vomiting, fever, or chills.  No other issues noted.   Patient Active Problem List   Diagnosis Date Noted  . History of DVT (deep vein thrombosis) 03/21/2017  . History of pulmonary embolism 03/21/2017  . Skin lesion 02/18/2017  . Dysfunction of both eustachian tubes 06/14/2016  . Referred otalgia of left ear 06/14/2016  . Tinnitus of both ears 06/14/2016  . Palpitations 02/27/2016  . Hemorrhoids 09/29/2015  . Routine general medical examination at a health care facility 09/29/2015  . Encounter for general adult medical examination without abnormal findings 09/29/2015  . Migraine without aura and responsive to treatment 02/15/2015  . Migraine without aura and without status migrainosus, not intractable 02/15/2015    Current Outpatient Prescriptions on File Prior to Visit  Medication Sig Dispense Refill  . ALPRAZolam (XANAX) 0.25 MG tablet Take 1 tablet (0.25 mg total) by mouth as needed for anxiety (panic attacks). Do not take more than two tablets per day. (Patient not taking: Reported on 04/15/2017) 20 tablet 0  . apixaban (ELIQUIS) 5 MG TABS tablet Take 1 tablet (5 mg total) by mouth 2 (two) times daily. 120 tablet 0  . cetirizine (ZYRTEC) 10 MG tablet Take 1 tablet (10 mg total) by mouth daily. 30 tablet 11  . docusate sodium (COLACE) 100 MG capsule Take 100 mg by mouth 2 (two) times daily.    . fluticasone (FLONASE) 50 MCG/ACT nasal spray Place 2 sprays into both nostrils daily. 16 g 0  . HYDROcodone-acetaminophen (NORCO) 10-325 MG tablet Take 1 tablet by mouth every 6 (six) hours as needed. (Patient not taking: Reported on 04/15/2017) 21 tablet 0  . HYDROcodone-acetaminophen (NORCO) 10-325 MG  tablet Take 1 tablet by mouth every 6 (six) hours as needed.    . IRON PO Take by mouth.    . Multiple Vitamins-Minerals (MULTIVITAMIN WITH MINERALS) tablet Take 1 tablet by mouth daily.    . Multiple Vitamins-Minerals (THERA-M) TABS Take by mouth.    Marland Kitchen. omeprazole (PRILOSEC) 40 MG capsule Take 40 mg by mouth daily.  2  . promethazine (PHENERGAN) 25 MG tablet Take 25 mg by mouth every 8 (eight) hours as needed for nausea or vomiting.    . sertraline (ZOLOFT) 50 MG tablet Week 1: Take one tablet daily Week 2: Take two tablets daily Week 3 and 4: Take three tablets daily (Patient not taking: Reported on 04/15/2017) 90 tablet 0  . valACYclovir (VALTREX) 500 MG tablet Take 500 mg by mouth daily as needed.   6   Current Facility-Administered Medications on File Prior to Visit  Medication Dose Route Frequency Provider Last Rate Last Dose  . triamcinolone acetonide (KENALOG) 10 MG/ML injection 10 mg  10 mg Other Once Asencion IslamStover, Titorya, DPM        Allergies  Allergen Reactions  . Esomeprazole Magnesium Other (See Comments)    Other Other reaction(s): Unknown    Objective: There were no vitals filed for this visit.  General: No acute distress, AAOx3  Left foot: Incision site well healed, resolved swelling to left forefoot, no erythema, no warmth, no drainage, no signs of infection noted, Capillary fill time <3 seconds in all digits, gross sensation present via light touch to left foot.  No pain or crepitation with range of motion left foot.  No pain with calf compression.   Xray, Left- hardware intact consistent with post op status   Assessment and Plan:  Problem List Items Addressed This Visit    None    Visit Diagnoses    S/P foot surgery, left    -  Primary   Relevant Orders   DG Foot Complete Left (Completed)   Acute deep vein thrombosis (DVT) of left lower extremity, unspecified vein (HCC)           -Patient seen and evaluated -Xrays reviewed  -Encouraged increased range of motion  and toe exercises  -Advised patient to limit activity to tolerance with normal shoe as instructed  -Advised patient to ice and elevate as necessary  -Continue Eliquis and PCP follow up  -Will plan for full return to normal activities at next office visit. In the meantime, patient to call office if any issues or problems arise.   Asencion Islam, DPM

## 2017-04-24 DIAGNOSIS — L719 Rosacea, unspecified: Secondary | ICD-10-CM | POA: Diagnosis not present

## 2017-04-24 DIAGNOSIS — L858 Other specified epidermal thickening: Secondary | ICD-10-CM | POA: Diagnosis not present

## 2017-04-26 DIAGNOSIS — Z3202 Encounter for pregnancy test, result negative: Secondary | ICD-10-CM | POA: Diagnosis not present

## 2017-04-26 DIAGNOSIS — Z30017 Encounter for initial prescription of implantable subdermal contraceptive: Secondary | ICD-10-CM | POA: Diagnosis not present

## 2017-05-24 ENCOUNTER — Telehealth: Payer: Self-pay | Admitting: Physician Assistant

## 2017-05-24 NOTE — Telephone Encounter (Signed)
Pt called concerned about DVT. She said she had really bad dull/aching pain but not like cramping pain that she originally had. Pt is scheduled to come in on 7/9 to evaluate this and get med refill for blood thinner. Pt is on last month of blood thinner. She was going to keep appt for 7/9 but wanted to let GrenadaBrittany know what was going on in case she needs to come in sooner. Pt best number is her cell [phone] and if she can't be reached her work number is [phone].

## 2017-05-24 NOTE — Telephone Encounter (Signed)
Please call pt and just let her know that if she has any worsening pain or redness or warmth to be seen by the ED. Otherwise, she can follow up with me as planned on 06/03/17. Thank you!

## 2017-05-24 NOTE — Telephone Encounter (Signed)
Spoke with patient and asked if she has had any worsening redness, pain, or warmth and if she did she should go to the ED. Patient stated that she hasn't had any problems today but it comes and goes sometimes. Advised that if she is not having any problems then following up with [place] on 06/03/2017 is fine. Patient understood.

## 2017-05-25 ENCOUNTER — Ambulatory Visit: Payer: BLUE CROSS/BLUE SHIELD | Admitting: Physician Assistant

## 2017-05-26 ENCOUNTER — Other Ambulatory Visit: Payer: Self-pay | Admitting: Physician Assistant

## 2017-05-26 DIAGNOSIS — Z86711 Personal history of pulmonary embolism: Secondary | ICD-10-CM

## 2017-05-26 DIAGNOSIS — Z86718 Personal history of other venous thrombosis and embolism: Secondary | ICD-10-CM

## 2017-05-27 ENCOUNTER — Emergency Department (HOSPITAL_COMMUNITY): Payer: BLUE CROSS/BLUE SHIELD

## 2017-05-27 ENCOUNTER — Emergency Department (HOSPITAL_COMMUNITY)
Admission: EM | Admit: 2017-05-27 | Discharge: 2017-05-27 | Disposition: A | Discharge status: Home / Self Care | Payer: BLUE CROSS/BLUE SHIELD | Attending: Emergency Medicine | Admitting: Emergency Medicine

## 2017-05-27 ENCOUNTER — Encounter (HOSPITAL_COMMUNITY): Payer: Self-pay | Admitting: Emergency Medicine

## 2017-05-27 ENCOUNTER — Other Ambulatory Visit: Payer: Self-pay | Admitting: Physician Assistant

## 2017-05-27 DIAGNOSIS — Z79899 Other long term (current) drug therapy: Secondary | ICD-10-CM | POA: Insufficient documentation

## 2017-05-27 DIAGNOSIS — R072 Precordial pain: Secondary | ICD-10-CM | POA: Diagnosis not present

## 2017-05-27 DIAGNOSIS — R079 Chest pain, unspecified: Secondary | ICD-10-CM

## 2017-05-27 DIAGNOSIS — R0602 Shortness of breath: Secondary | ICD-10-CM | POA: Diagnosis not present

## 2017-05-27 DIAGNOSIS — J309 Allergic rhinitis, unspecified: Secondary | ICD-10-CM

## 2017-05-27 DIAGNOSIS — Z87891 Personal history of nicotine dependence: Secondary | ICD-10-CM | POA: Insufficient documentation

## 2017-05-27 DIAGNOSIS — R091 Pleurisy: Secondary | ICD-10-CM

## 2017-05-27 HISTORY — DX: Acute embolism and thrombosis of unspecified deep veins of unspecified lower extremity: I82.409

## 2017-05-27 LAB — BASIC METABOLIC PANEL
Anion gap: 8 (ref 5–15)
BUN: 13 mg/dL (ref 6–20)
CO2: 21 mmol/L — ABNORMAL LOW (ref 22–32)
Calcium: 9.4 mg/dL (ref 8.9–10.3)
Chloride: 107 mmol/L (ref 101–111)
Creatinine, Ser: 0.8 mg/dL (ref 0.44–1.00)
GFR calc Af Amer: >60 mL/min (ref >60)
GFR calc non Af Amer: >60 mL/min (ref >60)
Glucose, Bld: 108 mg/dL — ABNORMAL HIGH (ref 65–99)
Potassium: 4 mmol/L (ref 3.5–5.1)
Sodium: 136 mmol/L (ref 135–145)

## 2017-05-27 LAB — CBC
HCT: 41.9 % (ref 36.0–46.0)
Hemoglobin: 14.5 g/dL (ref 12.0–15.0)
MCH: 29.8 pg (ref 26.0–34.0)
MCHC: 34.6 g/dL (ref 30.0–36.0)
MCV: 86 fL (ref 78.0–100.0)
Platelets: 194 10*3/uL (ref 150–400)
RBC: 4.87 MIL/uL (ref 3.87–5.11)
RDW: 12.2 % (ref 11.5–15.5)
WBC: 6.9 10*3/uL (ref 4.0–10.5)

## 2017-05-27 LAB — I-STAT TROPONIN, ED: Troponin i, poc: 0 ng/mL (ref 0.00–0.08)

## 2017-05-27 LAB — POCT PREGNANCY, URINE: Preg Test, Ur: NEGATIVE

## 2017-05-27 MED ORDER — IOPAMIDOL (ISOVUE-370) INJECTION 76%
INTRAVENOUS | Status: AC
  Filled 2017-05-27: qty 100

## 2017-05-27 MED ORDER — SODIUM CHLORIDE 0.9 % IV BOLUS (SEPSIS)
1000.0000 mL | Freq: Once | INTRAVENOUS | Status: AC
  Administered 2017-05-27: 1000 mL via INTRAVENOUS

## 2017-05-27 MED ORDER — IOPAMIDOL (ISOVUE-370) INJECTION 76%
100.0000 mL | Freq: Once | INTRAVENOUS | Status: AC | PRN
  Administered 2017-05-27: 100 mL via INTRAVENOUS

## 2017-05-27 MED ORDER — NAPROXEN 500 MG PO TABS: 500.0000 mg | ORAL_TABLET | Freq: Two times a day (BID) | ORAL | 0 refills | Status: DC

## 2017-05-27 NOTE — ED Provider Notes (Signed)
[facility]-EMERGENCY DEPT Provider Note   CSN: [id number] Arrival date & time: 05/27/17  1241     History   Chief Complaint Chief Complaint  Patient presents with  . Chest Pain  . Shortness of Breath    HPI Stacy Wilkins is a 31 y.o. female. Chief complaint is low substernal chest pain, shortness of breath.  HPI 31 year old female. Diagnosed with pulmonary embolus by CT in April of this year. Had had recent lower extremity surgery with podiatry for a bunion. Was in a cam walker. Develop DVT, secondarily embolize. Was placed on Eliquis, and DC'd from ER as small peripheral PEs.  Has done well. Is compliant with her Eliquis 5mg  bid. She was on oral contraceptives at the time. Has stopped this. However does have an Implanon that has been placed since that time.  She describes symptoms over several days. Worse at night when she lays down sharp pain in her substernal chest occasional shortness of breath. No pain with exertion. No leg swelling.  No additional procedures. Has not been immobilized. Out of her cam walker and walking normally. Does not smoke.  Past Medical History:  Diagnosis Date  . Anxiety   . Depression   . DVT (deep venous thrombosis) (HCC)   . Generalized headaches   . GERD (gastroesophageal reflux disease)   . Hyperlipidemia   . Migraine   . Ulcer     Patient Active Problem List   Diagnosis Date Noted  . History of DVT (deep vein thrombosis) 03/21/2017  . History of pulmonary embolism 03/21/2017  . Skin lesion 02/18/2017  . Dysfunction of both eustachian tubes 06/14/2016  . Referred otalgia of left ear 06/14/2016  . Tinnitus of both ears 06/14/2016  . Palpitations 02/27/2016  . Hemorrhoids 09/29/2015  . Routine general medical examination at a health care facility 09/29/2015  . Encounter for general adult medical examination without abnormal findings 09/29/2015  . Migraine without aura and responsive to treatment 02/15/2015  . Migraine without aura and  without status migrainosus, not intractable 02/15/2015    Past Surgical History:  Procedure Laterality Date  . bunionectomy     . EYE SURGERY    . TONSILLECTOMY AND ADENOIDECTOMY      OB History    No data available       Home Medications    Prior to Admission medications   Medication Sig Start Date End Date Taking? Authorizing Provider  acetaminophen (TYLENOL) 500 MG tablet Take 1,000 mg by mouth every 6 (six) hours as needed for mild pain.   Yes [provider]  apixaban (ELIQUIS) 5 MG TABS tablet Take 1 tablet (5 mg total) by mouth 2 (two) times daily. 03/19/17 05/27/17 Yes Barnett AbuWiseman, [place] D, PA-C  cetirizine (ZYRTEC) 10 MG tablet Take 1 tablet (10 mg total) by mouth daily. 04/15/17  Yes Benjiman CoreWiseman, Brittany D, PA-C  etonogestrel (NEXPLANON) 68 MG IMPL implant 1 each by Subdermal route once.   Yes [provider]  fluticasone (FLONASE) 50 MCG/ACT nasal spray Place 2 sprays into both nostrils daily. 04/15/17  Yes Barnett AbuWiseman, [place] D, PA-C  Multiple Vitamins-Minerals (THERA-M) TABS Take 1 tablet by mouth daily.    Yes [provider]  ALPRAZolam (XANAX) 0.25 MG tablet Take 1 tablet (0.25 mg total) by mouth as needed for anxiety (panic attacks). Do not take more than two tablets per day. Patient not taking: Reported on 04/15/2017 01/03/17   Magdalene RiverWiseman, Brittany D, PA-C  HYDROcodone-acetaminophen [facility](NORCO) 10-325 MG tablet Take 1 tablet by  mouth every 6 (six) hours as needed. Patient not taking: Reported on 04/15/2017 04/04/17   Asencion Islam, DPM  sertraline (ZOLOFT) 50 MG tablet Week 1: Take one tablet daily Week 2: Take two tablets daily Week 3 and 4: Take three tablets daily Patient not taking: Reported on 04/15/2017 01/03/17   Magdalene River, PA-C    Family History Family History  Problem Relation Age of Onset  . Hypertension Mother   . Cancer Father   . Hypertension Father   . Cancer Brother   . Diabetes Maternal Uncle     Social History Social  History  Substance Use Topics  . Smoking status: Former Games developer  . Smokeless tobacco: Never Used  . Alcohol use 0.0 oz/week     Allergies   Esomeprazole magnesium; Cabbage; Mustard seed; Onion; Other; and Tomato   Review of Systems Review of Systems  Constitutional: Negative for appetite change, chills, diaphoresis, fatigue and fever.  HENT: Negative for mouth sores, sore throat and trouble swallowing.   Eyes: Negative for visual disturbance.  Respiratory: Positive for shortness of breath. Negative for cough, chest tightness and wheezing.   Cardiovascular: Positive for chest pain.  Gastrointestinal: Negative for abdominal distention, abdominal pain, diarrhea, nausea and vomiting.  Endocrine: Negative for polydipsia, polyphagia and polyuria.  Genitourinary: Negative for dysuria, frequency and hematuria.  Musculoskeletal: Negative for gait problem.  Skin: Negative for color change, pallor and rash.  Neurological: Negative for dizziness, syncope, light-headedness and headaches.  Hematological: Does not bruise/bleed easily.  Psychiatric/Behavioral: Negative for behavioral problems and confusion.     Physical Exam Updated Vital Signs BP (!) 107/91 (BP Location: Right Arm)   Pulse 92   Temp 97.9 F (36.6 C) (Oral)   Resp 16   Ht 5\' 5"  (1.651 m)   Wt 72.6 kg (160 lb)   LMP 05/22/2017   SpO2 99%   BMI 26.63 kg/m   Physical Exam  Constitutional: She is oriented to person, place, and time. She appears well-developed and well-nourished. No distress.  HENT:  Head: Normocephalic.  Eyes: Conjunctivae are normal. Pupils are equal, round, and reactive to light. No scleral icterus.  Neck: Normal range of motion. Neck supple. No thyromegaly present.  Cardiovascular: Normal rate and regular rhythm.  Exam reveals no gallop and no friction rub.   No murmur heard. Heart rate 106 at triage and bend on EKG. The bedside she is 93 and sinus  Pulmonary/Chest: Effort normal and breath sounds  normal. No respiratory distress. She has no wheezes. She has no rales.  Abdominal: Soft. Bowel sounds are normal. She exhibits no distension. There is no tenderness. There is no rebound.  Musculoskeletal: Normal range of motion.  Neurological: She is alert and oriented to person, place, and time.  Skin: Skin is warm and dry. No rash noted.  Psychiatric: She has a normal mood and affect. Her behavior is normal.     ED Treatments / Results  Labs (all labs ordered are listed, but only abnormal results are displayed) Labs Reviewed  BASIC METABOLIC PANEL - Abnormal; Notable for the following:       Result Value   CO2 21 (*)    Glucose, Bld 108 (*)    All other components within normal limits  CBC  I-STAT TROPOININ, ED  POC URINE PREG, ED  POCT PREGNANCY, URINE    EKG  EKG Interpretation  Date/Time:  Monday May 27 2017 12:48:45 EDT Ventricular Rate:  106 PR Interval:    QRS Duration:  69 QT Interval:  312 QTC Calculation: 415 R Axis:   79 Text Interpretation:  Sinus tachycardia since last tracing no significant change Confirmed by Mancel Bale [phone]) on 05/27/2017 2:12:09 PM Also confirmed by Rolland Porter ([id number])  on 05/27/2017 4:16:46 PM       Radiology Dg Chest 2 View  Result Date: 05/27/2017 CLINICAL DATA:  Chest pain and shortness of breath EXAM: CHEST  2 VIEW COMPARISON:  03/19/2017 FINDINGS: Normal heart size and mediastinal contours. No acute infiltrate or edema. No effusion or pneumothorax. No acute osseous findings. IMPRESSION: Negative chest. Electronically Signed   By: Marnee Spring M.D.   On: 05/27/2017 14:35   Ct Angio Chest Pe W And/or Wo Contrast  Result Date: 05/27/2017 CLINICAL DATA:  Central chest pain.  Short of breath.  Prior DVT. EXAM: CT ANGIOGRAPHY CHEST WITH CONTRAST TECHNIQUE: Multidetector CT imaging of the chest was performed using the standard protocol during bolus administration of intravenous contrast. Multiplanar CT image reconstructions and MIPs  were obtained to evaluate the vascular anatomy. CONTRAST:  100 mL Isovue COMPARISON:  Chest CT 03/05/2017 FINDINGS: Cardiovascular: No filling defects within the pulmonary arteries to suggest acute pulmonary embolism. Mediastinum/Nodes: No axillary supraclavicular adenopathy. No mediastinal hilar adenopathy. No pericardial fluid. Esophagus normal. Lungs/Pleura: No pulmonary infarction. No infiltrate or pneumothorax. No pleural fluid airways are normal. Upper Abdomen: Limited view of the liver, kidneys, pancreas are unremarkable. Normal adrenal glands. Musculoskeletal: No aggressive osseous lesion. Review of the MIP images confirms the above findings. IMPRESSION: 1. No evidence acute pulmonary embolism. 2. No residual pulmonary emboli from prior CT comparison. Electronically Signed   By: Genevive Bi M.D.   On: 05/27/2017 18:29    Procedures Procedures (including critical care time)  Medications Ordered in ED Medications  iopamidol (ISOVUE-370) 76 % injection (not administered)  iopamidol (ISOVUE-370) 76 % injection 100 mL (100 mLs Intravenous Contrast Given 05/27/17 1806)  sodium chloride 0.9 % bolus 1,000 mL (1,000 mLs Intravenous New Bag/Given 05/27/17 1837)     Initial Impression / Assessment and Plan / ED Course  I have reviewed the triage vital signs and the nursing notes.  Pertinent labs & imaging results that were available during my care of the patient were reviewed by me and considered in my medical decision making (see chart for details).   although progesterone only, her hormone implanted birth control is still risk. We'll repeat CT to ensure no large clot burden.  Final Clinical Impressions(s) / ED Diagnoses   Final diagnoses:  Chest pain, unspecified type    CTA shows NO new or old clot burden. Likely pleurisy. Cant do NSAIDS 2/2 Eliquis. Will give amall rx Tramadol.  New Prescriptions New Prescriptions   No medications on file     Rolland Porter, MD 05/27/17 [phone]

## 2017-05-27 NOTE — ED Triage Notes (Signed)
Patient having central chest pain and SOB since last night. Patient on Eliquis for DVT and PE back in April this year.

## 2017-05-27 NOTE — ED Notes (Signed)
Pt had drawn in triage Gold  Lavender  Dark green

## 2017-05-29 ENCOUNTER — Other Ambulatory Visit: Payer: Self-pay | Admitting: Physician Assistant

## 2017-05-29 DIAGNOSIS — Z86711 Personal history of pulmonary embolism: Secondary | ICD-10-CM

## 2017-05-29 DIAGNOSIS — Z86718 Personal history of other venous thrombosis and embolism: Secondary | ICD-10-CM

## 2017-05-30 NOTE — Telephone Encounter (Signed)
I called [facility] to confirm that this medication was refilled by Benny LennertSarah Weber on 05/28/17. [facility] states "yes the received it" Pt next appointment is on 06/03/17 with B. Barnett AbuWiseman

## 2017-06-03 ENCOUNTER — Ambulatory Visit: Payer: BLUE CROSS/BLUE SHIELD | Admitting: Physician Assistant

## 2017-06-04 ENCOUNTER — Ambulatory Visit: Payer: BLUE CROSS/BLUE SHIELD | Admitting: Sports Medicine

## 2017-06-11 ENCOUNTER — Ambulatory Visit: Payer: BLUE CROSS/BLUE SHIELD | Admitting: Sports Medicine

## 2017-07-09 ENCOUNTER — Encounter: Payer: Self-pay | Admitting: Physician Assistant

## 2017-07-09 ENCOUNTER — Ambulatory Visit (INDEPENDENT_AMBULATORY_CARE_PROVIDER_SITE_OTHER): Payer: BLUE CROSS/BLUE SHIELD | Admitting: Physician Assistant

## 2017-07-09 VITALS — BP 110/81 | HR 92 | Temp 98.9°F | Resp 16 | Ht 65.0 in | Wt 163.4 lb

## 2017-07-09 DIAGNOSIS — M79675 Pain in left toe(s): Secondary | ICD-10-CM | POA: Diagnosis not present

## 2017-07-09 DIAGNOSIS — L600 Ingrowing nail: Secondary | ICD-10-CM

## 2017-07-09 DIAGNOSIS — M79674 Pain in right toe(s): Secondary | ICD-10-CM | POA: Diagnosis not present

## 2017-07-09 MED ORDER — MUPIROCIN 2 % EX OINT: 1.0000 "application " | TOPICAL_OINTMENT | Freq: Three times a day (TID) | CUTANEOUS | 0 refills | Status: DC

## 2017-07-09 NOTE — Patient Instructions (Addendum)
INGROWN TOENAIL . Keep area clean, dry and bandaged for 24 hours. . After 24 hours, remove outer bandage and leave yellow gauze in place. . Soak toe/foot in warm soapy water for 5-10 minutes, once daily for 5 days. Rebandage toe after each cleaning. . Continue soaks until yellow gauze falls off. . Notify the office if you experience any of the following signs of infection: Swelling, redness, pus drainage, streaking, fever > 101.0 F   Ingrown Toenail An ingrown toenail occurs when the corner or sides of your toenail grow into the surrounding skin. The big toe is most commonly affected, but it can happen to any of your toes. If your ingrown toenail is not treated, you will be at risk for infection. What are the causes? This condition may be caused by:  Wearing shoes that are too small or tight.  Injury or trauma, such as stubbing your toe or having your toe stepped on.  Improper cutting or care of your toenails.  Being born with (congenital) nail or foot abnormalities, such as having a nail that is too big for your toe.  What increases the risk? Risk factors for an ingrown toenail include:  Age. Your nails tend to thicken as you get older, so ingrown nails are more common in older people.  Diabetes.  Cutting your toenails incorrectly.  Blood circulation problems.  What are the signs or symptoms? Symptoms may include:  Pain, soreness, or tenderness.  Redness.  Swelling.  Hardening of the skin surrounding the toe.  Your ingrown toenail may be infected if there is fluid, pus, or drainage. How is this diagnosed? An ingrown toenail may be diagnosed by medical history and physical exam. If your toenail is infected, your health care provider may test a sample of the drainage. How is this treated? Treatment depends on the severity of your ingrown toenail. Some ingrown toenails may be treated at home. More severe or infected ingrown toenails may require surgery to remove  all or part of the nail. Infected ingrown toenails may also be treated with antibiotic medicines. Follow these instructions at home:  If you were prescribed an antibiotic medicine, finish all of it even if you start to feel better.  Soak your foot in warm soapy water for 20 minutes, 3 times per day or as directed by your health care provider.  Carefully lift the edge of the nail away from the sore skin by wedging a small piece of cotton under the corner of the nail. This may help with the pain. Be careful not to cause more injury to the area.  Wear shoes that fit well. If your ingrown toenail is causing you pain, try wearing sandals, if possible.  Trim your toenails regularly and carefully. Do not cut them in a curved shape. Cut your toenails straight across. This prevents injury to the skin at the corners of the toenail.  Keep your feet clean and dry.  If you are having trouble walking and are given crutches by your health care provider, use them as directed.  Do not pick at your toenail or try to remove it yourself.  Take medicines only as directed by your health care provider.  Keep all follow-up visits as directed by your health care provider. This is important. Contact a health care provider if:  Your symptoms do not improve with treatment. Get help right away if:  You have red streaks that start at your foot and go up your leg.  You have a fever.    You have increased redness, swelling, or pain.  You have fluid, blood, or pus coming from your toenail. This information is not intended to replace advice given to you by your health care provider. Make sure you discuss any questions you have with your health care provider. Document Released: 11/09/2000 Document Revised: 04/13/2016 Document Reviewed: 10/06/2014 Elsevier Interactive Patient Education  2018 [employer]    IF you received an x-ray today, you will receive an invoice from [facility]. Please contact  [facility] at [phone] with questions or concerns regarding your invoice.   IF you received labwork today, you will receive an invoice from [facility]. Please contact [facility] at [phone] with questions or concerns regarding your invoice.   Our billing staff will not be able to assist you with questions regarding bills from these companies.  You will be contacted with the lab results as soon as they are available. The fastest way to get your results is to activate your My Chart account. Instructions are located on the last page of this paperwork. If you have not heard from us regarding the results in 2 weeks, please contact this office.      

## 2017-07-09 NOTE — Progress Notes (Signed)
Stacy Wilkins  MRN: [id number] DOB: 04-Jun-1986  Subjective:  Stacy Wilkins is a 31 y.o. female seen in office today for a chief complaint of pain in left 3rd toenail and right 4th toenail x 5 days. States she clipped her nails a week ago and thinks she clipped it too short because the nail then grew into the skin and it become red and painful. She dis embedded the right 4th toenail so that one has been healing but she could not disembed the left 3rd toenail. Has no pain in the actual toe. Denies purulent drainage, fever, chills, and diaphoresis. Has washed the toes with peroxide.d   Review of Systems  Patient Active Problem List   Diagnosis Date Noted  . History of DVT (deep vein thrombosis) 03/21/2017  . History of pulmonary embolism 03/21/2017  . Skin lesion 02/18/2017  . Dysfunction of both eustachian tubes 06/14/2016  . Referred otalgia of left ear 06/14/2016  . Tinnitus of both ears 06/14/2016  . Palpitations 02/27/2016  . Hemorrhoids 09/29/2015  . Routine general medical examination at a health care facility 09/29/2015  . Encounter for general adult medical examination without abnormal findings 09/29/2015  . Migraine without aura and responsive to treatment 02/15/2015  . Migraine without aura and without status migrainosus, not intractable 02/15/2015    Current Outpatient Prescriptions on File Prior to Visit  Medication Sig Dispense Refill  . acetaminophen (TYLENOL) 500 MG tablet Take 1,000 mg by mouth every 6 (six) hours as needed for mild pain.    . cetirizine (ZYRTEC) 10 MG tablet Take 1 tablet (10 mg total) by mouth daily. 30 tablet 11  . etonogestrel (NEXPLANON) 68 MG IMPL implant 1 each by Subdermal route once.    . fluticasone (FLONASE) 50 MCG/ACT nasal spray SPRAY TWICE INTO EACH NOSTRIL EVERY DAY 16 g 0  . Multiple Vitamins-Minerals (THERA-M) TABS Take 1 tablet by mouth daily.     Marland Kitchen ALPRAZolam (XANAX) 0.25 MG tablet Take 1 tablet (0.25 mg total) by mouth as  needed for anxiety (panic attacks). Do not take more than two tablets per day. (Patient not taking: Reported on 04/15/2017) 20 tablet 0  . ELIQUIS 5 MG TABS tablet TAKE 1 TABLET BY MOUTH TWICE DAILY (Patient not taking: Reported on 07/09/2017) 60 tablet 0  . HYDROcodone-acetaminophen (NORCO) 10-325 MG tablet Take 1 tablet by mouth every 6 (six) hours as needed. (Patient not taking: Reported on 04/15/2017) 21 tablet 0  . naproxen (NAPROSYN) 500 MG tablet Take 1 tablet (500 mg total) by mouth 2 (two) times daily. (Patient not taking: Reported on 07/09/2017) 30 tablet 0  . sertraline (ZOLOFT) 50 MG tablet Week 1: Take one tablet daily Week 2: Take two tablets daily Week 3 and 4: Take three tablets daily (Patient not taking: Reported on 04/15/2017) 90 tablet 0   Current Facility-Administered Medications on File Prior to Visit  Medication Dose Route Frequency Provider Last Rate Last Dose  . triamcinolone acetonide (KENALOG) 10 MG/ML injection 10 mg  10 mg Other Once Asencion Islam, DPM        Allergies  Allergen Reactions  . Esomeprazole Magnesium Other (See Comments)     Unknown reaction   . Cabbage   . Mustard Seed   . Onion   . Other     Green peppers  . Tomato      Objective:  BP 110/81   Pulse 92   Temp 98.9 F (37.2 C) (Oral)   Resp 16  Ht 5\' 5"  (1.651 m)   Wt 163 lb 6.4 oz (74.1 kg)   LMP 06/05/2017   SpO2 96%   BMI 27.19 kg/m   Physical Exam  Constitutional: She is oriented to person, place, and time and well-developed, well-nourished, and in no distress.  HENT:  Head: Normocephalic and atraumatic.  Eyes: Conjunctivae are normal.  Neck: Normal range of motion.  Pulmonary/Chest: Effort normal.  Neurological: She is alert and oriented to person, place, and time. Gait normal.  Skin: Skin is warm and dry.  Medial aspect of left 3rd toenail plate embedded in medial nail groove surrounding erythema, granulation tissue, and tenderness to palpation noted.   Mild erythema of  lateral distal aspect of right 4th toe. Lateral aspect of toenail has been clipped away by patient. There is no granulation tissue noted. Mild tenderness to palpation.    Psychiatric: Affect normal.  Vitals reviewed.    PROCEDURE NOTE: Ingrown Toenail Removal  Verbal Consent Obtained. Left 3rd toe wiped with alcohol prep pad, then digital block with 2cc of 2% lidocaine without epinephrine used. Sterile prep and drape. Medial aspect of 3rd toe nail lifted and excised, lateral aspect of nail left intact. Proximal aspect of nail bed explored revealing no nail remnants. Ingrown tissue debrided. No active bleeding. Xeroform dressing applied. Cleansed and dressed. Wound care instructions including precautions reviewed with patient.  Assessment and Plan :  1. Toe pain, bilateral 2. Ingrown toenail Left ingrown toenail removed. Pt had already removed the right toenail but is still having some mild erythema at the site. Given topical mupirocin ointment to apply to the right toenail. Given wound care instructions for left foot. Instructed to return if the right foot does not fully resolve as she was able to remove a good portion of the nail but it may need to have the ingrown toenail removal procedure in office if it continues to have erythema and tenderness despite using topical mupirocin. Pt understands and agrees to tx plan.  - mupirocin ointment (BACTROBAN) 2 %; Apply 1 application topically 3 (three) times daily.  Dispense: 22 g; Refill: 0   Benjiman CoreBrittany Wiseman, PA-C  Primary Care at [facility] 07/09/2017 8:43 PM

## 2017-07-22 ENCOUNTER — Other Ambulatory Visit: Payer: Self-pay | Admitting: Physician Assistant

## 2017-07-22 DIAGNOSIS — J309 Allergic rhinitis, unspecified: Secondary | ICD-10-CM

## 2017-08-04 ENCOUNTER — Other Ambulatory Visit: Payer: Self-pay | Admitting: Physician Assistant

## 2017-08-04 DIAGNOSIS — Z86718 Personal history of other venous thrombosis and embolism: Secondary | ICD-10-CM

## 2017-08-04 DIAGNOSIS — Z86711 Personal history of pulmonary embolism: Secondary | ICD-10-CM

## 2017-08-06 ENCOUNTER — Encounter: Payer: BLUE CROSS/BLUE SHIELD | Admitting: Physician Assistant

## 2017-08-14 ENCOUNTER — Ambulatory Visit (INDEPENDENT_AMBULATORY_CARE_PROVIDER_SITE_OTHER): Payer: BLUE CROSS/BLUE SHIELD | Admitting: Physician Assistant

## 2017-08-14 ENCOUNTER — Encounter: Payer: Self-pay | Admitting: Physician Assistant

## 2017-08-14 VITALS — BP 114/80 | HR 82 | Temp 98.6°F | Resp 18 | Ht 65.83 in | Wt 165.8 lb

## 2017-08-14 DIAGNOSIS — R0602 Shortness of breath: Secondary | ICD-10-CM

## 2017-08-14 DIAGNOSIS — F411 Generalized anxiety disorder: Secondary | ICD-10-CM

## 2017-08-14 MED ORDER — HYDROXYZINE HCL 25 MG PO TABS: 12.5000 mg | ORAL_TABLET | Freq: Three times a day (TID) | ORAL | 0 refills | Status: DC | PRN
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
## 2017-08-14 NOTE — Patient Instructions (Addendum)
Your physical exam findings and vital signs are reassuring. This is likely due to adjustment disorder. I would like you to use hydroxyzine as needed for moments of shortness of breath to see if this will help. Please follow up in 2 days for reevaluation, if your symptoms have not improved, we can consider further testing. In the meantime, if you develop any worsening shortness of breath, chest pain, lower leg swelling, warmth, or tachycardia please go to the ED immediately. Thank you for letting me participate in your health and well being.    Shortness of Breath, Adult Shortness of breath means you have trouble breathing. Your lungs are organs for breathing. Follow these instructions at home: Pay attention to any changes in your symptoms. Take these actions to help with your condition:  Do not smoke. Smoking can cause shortness of breath. If you need help to quit smoking, ask your doctor.  Avoid things that can make it harder to breathe, such as: ? Mold. ? Dust. ? Air pollution. ? Chemical smells. ? Things that can cause allergy symptoms (allergens), if you have allergies.  Keep your living space clean and free of mold and dust.  Rest as needed. Slowly return to your usual activities.  Take over-the-counter and prescription medicines, including oxygen and inhaled medicines, only as told by your doctor.  Keep all follow-up visits as told by your doctor. This is important.  Contact a doctor if:  Your condition does not get better as soon as expected.  You have a hard time doing your normal activities, even after you rest.  You have new symptoms. Get help right away if:  You have trouble breathing when you are resting.  You feel light-headed or you faint.  You have a cough that is not helped by medicines.  You cough up blood.  You have pain with breathing.  You have pain in your chest, arms, shoulders, or belly (abdomen).  You have a fever.  You cannot walk up  stairs.  You cannot exercise the way you normally do. This information is not intended to replace advice given to you by your health care provider. Make sure you discuss any questions you have with your health care provider. Document Released: 04/30/2008 Document Revised: 11/29/2016 Document Reviewed: 11/29/2016 Elsevier Interactive Patient Education  2017 [employer].  IF you received an x-ray today, you will receive an invoice from [facility]. Please contact [facility] at [phone] with questions or concerns regarding your invoice.   IF you received labwork today, you will receive an invoice from [facility]. Please contact [facility] at [phone] with questions or concerns regarding your invoice.   Our billing staff will not be able to assist you with questions regarding bills from these companies.  You will be contacted with the lab results as soon as they are available. The fastest way to get your results is to activate your My Chart account. Instructions are located on the last page of this paperwork. If you have not heard from [place] regarding the results in 2 weeks, please contact this office.

## 2017-08-20 ENCOUNTER — Encounter: Payer: BLUE CROSS/BLUE SHIELD | Admitting: Physician Assistant

## 2017-08-26 ENCOUNTER — Encounter: Payer: BLUE CROSS/BLUE SHIELD | Admitting: Physician Assistant

## 2017-09-02 ENCOUNTER — Encounter: Payer: BLUE CROSS/BLUE SHIELD | Admitting: Physician Assistant

## 2017-09-04 DIAGNOSIS — Z118 Encounter for screening for other infectious and parasitic diseases: Secondary | ICD-10-CM | POA: Diagnosis not present

## 2017-09-04 DIAGNOSIS — Z119 Encounter for screening for infectious and parasitic diseases, unspecified: Secondary | ICD-10-CM | POA: Diagnosis not present

## 2017-09-04 DIAGNOSIS — Z7252 High risk homosexual behavior: Secondary | ICD-10-CM | POA: Diagnosis not present

## 2017-09-04 DIAGNOSIS — Z113 Encounter for screening for infections with a predominantly sexual mode of transmission: Secondary | ICD-10-CM | POA: Diagnosis not present

## 2017-09-16 ENCOUNTER — Encounter: Payer: Self-pay | Admitting: Physician Assistant

## 2017-09-16 ENCOUNTER — Ambulatory Visit (INDEPENDENT_AMBULATORY_CARE_PROVIDER_SITE_OTHER): Payer: BLUE CROSS/BLUE SHIELD | Admitting: Physician Assistant

## 2017-09-16 ENCOUNTER — Encounter: Payer: BLUE CROSS/BLUE SHIELD | Admitting: Physician Assistant

## 2017-09-16 VITALS — BP 120/78 | HR 99 | Temp 98.8°F | Resp 18 | Ht 66.26 in | Wt 165.8 lb

## 2017-09-16 DIAGNOSIS — Z1389 Encounter for screening for other disorder: Secondary | ICD-10-CM | POA: Diagnosis not present

## 2017-09-16 DIAGNOSIS — Z1329 Encounter for screening for other suspected endocrine disorder: Secondary | ICD-10-CM | POA: Diagnosis not present

## 2017-09-16 DIAGNOSIS — Z130 Encounter for screening for diseases of the blood and blood-forming organs and certain disorders involving the immune mechanism: Secondary | ICD-10-CM | POA: Diagnosis not present

## 2017-09-16 DIAGNOSIS — Z0000 Encounter for general adult medical examination without abnormal findings: Secondary | ICD-10-CM

## 2017-09-16 DIAGNOSIS — Z13220 Encounter for screening for lipoid disorders: Secondary | ICD-10-CM | POA: Diagnosis not present

## 2017-09-16 DIAGNOSIS — F411 Generalized anxiety disorder: Secondary | ICD-10-CM

## 2017-09-16 DIAGNOSIS — Z13228 Encounter for screening for other metabolic disorders: Secondary | ICD-10-CM

## 2017-09-16 DIAGNOSIS — F419 Anxiety disorder, unspecified: Secondary | ICD-10-CM | POA: Insufficient documentation

## 2017-09-16 MED ORDER — HYDROXYZINE HCL 25 MG PO TABS: 12.5000 mg | ORAL_TABLET | Freq: Three times a day (TID) | ORAL | 0 refills | Status: DC | PRN

## 2017-09-16 NOTE — Patient Instructions (Addendum)
Return when you are fasting (i.e., no food for 8 hours) to have blood work done within the next two weeks.   Health Maintenance, Female Adopting a healthy lifestyle and getting preventive care can go a long way to promote health and wellness. Talk with your health care provider about what schedule of regular examinations is right for you. This is a good chance for you to check in with your provider about disease prevention and staying healthy. In between checkups, there are plenty of things you can do on your own. Experts have done a lot of research about which lifestyle changes and preventive measures are most likely to keep you healthy. Ask your health care provider for more information. Weight and diet Eat a healthy diet  Be sure to include plenty of vegetables, fruits, low-fat dairy products, and lean protein.  Do not eat a lot of foods high in solid fats, added sugars, or salt.  Get regular exercise. This is one of the most important things you can do for your health. ? Most adults should exercise for at least 150 minutes each week. The exercise should increase your heart rate and make you sweat (moderate-intensity exercise). ? Most adults should also do strengthening exercises at least twice a week. This is in addition to the moderate-intensity exercise.  Maintain a healthy weight  Body mass index (BMI) is a measurement that can be used to identify possible weight problems. It estimates body fat based on height and weight. Your health care provider can help determine your BMI and help you achieve or maintain a healthy weight.  For females 55 years of age and older: ? A BMI below 18.5 is considered underweight. ? A BMI of 18.5 to 24.9 is normal. ? A BMI of 25 to 29.9 is considered overweight. ? A BMI of 30 and above is considered obese.  Watch levels of cholesterol and blood lipids  You should start having your blood tested for lipids and cholesterol at 31 years of age, then have  this test every 5 years.  You may need to have your cholesterol levels checked more often if: ? Your lipid or cholesterol levels are high. ? You are older than 31 years of age. ? You are at high risk for heart disease.  Cancer screening Lung Cancer  Lung cancer screening is recommended for adults 18-66 years old who are at high risk for lung cancer because of a history of smoking.  A yearly low-dose CT scan of the lungs is recommended for people who: ? Currently smoke. ? Have quit within the past 15 years. ? Have at least a 30-pack-year history of smoking. A pack year is smoking an average of one pack of cigarettes a day for 1 year.  Yearly screening should continue until it has been 15 years since you quit.  Yearly screening should stop if you develop a health problem that would prevent you from having lung cancer treatment.  Breast Cancer  Practice breast self-awareness. This means understanding how your breasts normally appear and feel.  It also means doing regular breast self-exams. Let your health care provider know about any changes, no matter how small.  If you are in your 20s or 30s, you should have a clinical breast exam (CBE) by a health care provider every 1-3 years as part of a regular health exam.  If you are 85 or older, have a CBE every year. Also consider having a breast X-ray (mammogram) every year.  If you  have a family history of breast cancer, talk to your health care provider about genetic screening.  If you are at high risk for breast cancer, talk to your health care provider about having an MRI and a mammogram every year.  Breast cancer gene (BRCA) assessment is recommended for women who have family members with BRCA-related cancers. BRCA-related cancers include: ? Breast. ? Ovarian. ? Tubal. ? Peritoneal cancers.  Results of the assessment will determine the need for genetic counseling and BRCA1 and BRCA2 testing.  Cervical Cancer Your health care  provider may recommend that you be screened regularly for cancer of the pelvic organs (ovaries, uterus, and vagina). This screening involves a pelvic examination, including checking for microscopic changes to the surface of your cervix (Pap test). You may be encouraged to have this screening done every 3 years, beginning at age 62.  For women ages 58-65, health care providers may recommend pelvic exams and Pap testing every 3 years, or they may recommend the Pap and pelvic exam, combined with testing for human papilloma virus (HPV), every 5 years. Some types of HPV increase your risk of cervical cancer. Testing for HPV may also be done on women of any age with unclear Pap test results.  Other health care providers may not recommend any screening for nonpregnant women who are considered low risk for pelvic cancer and who do not have symptoms. Ask your health care provider if a screening pelvic exam is right for you.  If you have had past treatment for cervical cancer or a condition that could lead to cancer, you need Pap tests and screening for cancer for at least 20 years after your treatment. If Pap tests have been discontinued, your risk factors (such as having a new sexual partner) need to be reassessed to determine if screening should resume. Some women have medical problems that increase the chance of getting cervical cancer. In these cases, your health care provider may recommend more frequent screening and Pap tests.  Colorectal Cancer  This type of cancer can be detected and often prevented.  Routine colorectal cancer screening usually begins at 31 years of age and continues through 31 years of age.  Your health care provider may recommend screening at an earlier age if you have risk factors for colon cancer.  Your health care provider may also recommend using home test kits to check for hidden blood in the stool.  A small camera at the end of a tube can be used to examine your colon  directly (sigmoidoscopy or colonoscopy). This is done to check for the earliest forms of colorectal cancer.  Routine screening usually begins at age 92.  Direct examination of the colon should be repeated every 5-10 years through 31 years of age. However, you may need to be screened more often if early forms of precancerous polyps or small growths are found.  Skin Cancer  Check your skin from head to toe regularly.  Tell your health care provider about any new moles or changes in moles, especially if there is a change in a mole's shape or color.  Also tell your health care provider if you have a mole that is larger than the size of a pencil eraser.  Always use sunscreen. Apply sunscreen liberally and repeatedly throughout the day.  Protect yourself by wearing long sleeves, pants, a wide-brimmed hat, and sunglasses whenever you are outside.  Heart disease, diabetes, and high blood pressure  High blood pressure causes heart disease and increases the  risk of stroke. High blood pressure is more likely to develop in: ? People who have blood pressure in the high end of the normal range (130-139/85-89 mm Hg). ? People who are overweight or obese. ? People who are African American.  If you are 35-9 years of age, have your blood pressure checked every 3-5 years. If you are 33 years of age or older, have your blood pressure checked every year. You should have your blood pressure measured twice-once when you are at a hospital or clinic, and once when you are not at a hospital or clinic. Record the average of the two measurements. To check your blood pressure when you are not at a hospital or clinic, you can use: ? An automated blood pressure machine at a pharmacy. ? A home blood pressure monitor.  If you are between 60 years and 104 years old, ask your health care provider if you should take aspirin to prevent strokes.  Have regular diabetes screenings. This involves taking a blood sample to  check your fasting blood sugar level. ? If you are at a normal weight and have a low risk for diabetes, have this test once every three years after 31 years of age. ? If you are overweight and have a high risk for diabetes, consider being tested at a younger age or more often. Preventing infection Hepatitis B  If you have a higher risk for hepatitis B, you should be screened for this virus. You are considered at high risk for hepatitis B if: ? You were born in a country where hepatitis B is common. Ask your health care provider which countries are considered high risk. ? Your parents were born in a high-risk country, and you have not been immunized against hepatitis B (hepatitis B vaccine). ? You have HIV or AIDS. ? You use needles to inject street drugs. ? You live with someone who has hepatitis B. ? You have had sex with someone who has hepatitis B. ? You get hemodialysis treatment. ? You take certain medicines for conditions, including cancer, organ transplantation, and autoimmune conditions.  Hepatitis C  Blood testing is recommended for: ? Everyone born from [birth year] through 1965. ? Anyone with known risk factors for hepatitis C.  Sexually transmitted infections (STIs)  You should be screened for sexually transmitted infections (STIs) including gonorrhea and chlamydia if: ? You are sexually active and are younger than 31 years of age. ? You are older than 31 years of age and your health care provider tells you that you are at risk for this type of infection. ? Your sexual activity has changed since you were last screened and you are at an increased risk for chlamydia or gonorrhea. Ask your health care provider if you are at risk.  If you do not have HIV, but are at risk, it may be recommended that you take a prescription medicine daily to prevent HIV infection. This is called pre-exposure prophylaxis (PrEP). You are considered at risk if: ? You are sexually active and do not regularly  use condoms or know the HIV status of your partner(s). ? You take drugs by injection. ? You are sexually active with a partner who has HIV.  Talk with your health care provider about whether you are at high risk of being infected with HIV. If you choose to begin PrEP, you should first be tested for HIV. You should then be tested every 3 months for as long as you are taking PrEP. Pregnancy  If you are premenopausal and you may become pregnant, ask your health care provider about preconception counseling.  If you may become pregnant, take 400 to 800 micrograms (mcg) of folic acid every day.  If you want to prevent pregnancy, talk to your health care provider about birth control (contraception). Osteoporosis and menopause  Osteoporosis is a disease in which the bones lose minerals and strength with aging. This can result in serious bone fractures. Your risk for osteoporosis can be identified using a bone density scan.  If you are 36 years of age or older, or if you are at risk for osteoporosis and fractures, ask your health care provider if you should be screened.  Ask your health care provider whether you should take a calcium or vitamin D supplement to lower your risk for osteoporosis.  Menopause may have certain physical symptoms and risks.  Hormone replacement therapy may reduce some of these symptoms and risks. Talk to your health care provider about whether hormone replacement therapy is right for you. Follow these instructions at home:  Schedule regular health, dental, and eye exams.  Stay current with your immunizations.  Do not use any tobacco products including cigarettes, chewing tobacco, or electronic cigarettes.  If you are pregnant, do not drink alcohol.  If you are breastfeeding, limit how much and how often you drink alcohol.  Limit alcohol intake to no more than 1 drink per day for nonpregnant women. One drink equals 12 ounces of beer, 5 ounces of wine, or 1 ounces  of hard liquor.  Do not use street drugs.  Do not share needles.  Ask your health care provider for help if you need support or information about quitting drugs.  Tell your health care provider if you often feel depressed.  Tell your health care provider if you have ever been abused or do not feel safe at home. This information is not intended to replace advice given to you by your health care provider. Make sure you discuss any questions you have with your health care provider. Document Released: 05/28/2011 Document Revised: 04/19/2016 Document Reviewed: 08/16/2015 Elsevier Interactive Patient Education  2018 [employer].    IF you received an x-ray today, you will receive an invoice from [facility]. Please contact [facility] at [phone] with questions or concerns regarding your invoice.   IF you received labwork today, you will receive an invoice from [place]. Please contact [facility] at [phone] with questions or concerns regarding your invoice.   Our billing staff will not be able to assist you with questions regarding bills from these companies.  You will be contacted with the lab results as soon as they are available. The fastest way to get your results is to activate your My Chart account. Instructions are located on the last page of this paperwork. If you have not heard from [place] regarding the results in 2 weeks, please contact this office.

## 2017-09-16 NOTE — Progress Notes (Signed)
Stacy Wilkins  MRN: [id number] DOB: [birth year]  Subjective:  Pt is a 32 y.o. female who presents for annual physical exam. Pt has had two cups of coffee and some crackers about 5 hours ago.    Social: Pt is from [place]. Currently lives at home with parents. She is moving to [place] in November. She is currently single.   Diet: Eats mostly meat, broccoli, and cheese. Does not eat much fruit. Drinks coffee and water. Does take daily multivitamin. Often skips meals but thinks this will get better when she moves out.   Exercise: Walks daily.   Sleep: 7-8 hours a night.  Menstrual cycles: Had nexplanon placed 04/2017. Last cycle was 09/02/17.  Last annual exam: ~2 years ago Last dental exam: ~ 1 year ago, brushes twice daily, flosses daily Last vision exam: 2018 Last pap smear: 08/05/2015, normal Last breast exam: 2018, normal  Vaccinations      Tetanus: 02/07/2014      HPV: Completed series   Anxiety: She is controlled on atarax 12.5 nightly. Feels like her anxiety is well controlled on this medication. Since she started this in 07/2017,her SOB, heart palpitations, chest pain, and panic attacks have all resolved. She has tried zoloft in the past and states it made her feel emotionless.        Patient Active Problem List   Diagnosis Date Noted  . Anxiety 09/16/2017  . History of DVT (deep vein thrombosis) 03/21/2017  . History of pulmonary embolism 03/21/2017  . Food allergy 11/08/2016  . Palpitations 02/27/2016  . Hemorrhoids 09/29/2015    Current Outpatient Prescriptions on File Prior to Visit  Medication Sig Dispense Refill  . cetirizine (ZYRTEC) 10 MG tablet Take 1 tablet (10 mg total) by mouth daily. 30 tablet 11  . etonogestrel (NEXPLANON) 68 MG IMPL implant 1 each by Subdermal route once.    . fluticasone (FLONASE) 50 MCG/ACT nasal spray SPRAY TWICE INTO EACH NOSTRIL EVERY DAY 16 g 2  . Multiple Vitamins-Minerals (THERA-M) TABS Take 1 tablet by mouth daily.      Marland Kitchen omeprazole (PRILOSEC) 40 MG capsule Take 40 mg by mouth daily.     Current Facility-Administered Medications on File Prior to Visit  Medication Dose Route Frequency Provider Last Rate Last Dose  . triamcinolone acetonide (KENALOG) 10 MG/ML injection 10 mg  10 mg Other Once Landis Martins, DPM        Allergies  Allergen Reactions  . Esomeprazole Magnesium Other (See Comments)     Unknown reaction   . Cabbage   . Mustard Seed   . Onion   . Other     Green peppers  . Tomato     Social History   Social History  . Marital status: Single    Spouse name: N/A  . Number of children: N/A  . Years of education: N/A   Occupational History  . Billing/Insurance     [employer]   Social History Main Topics  . Smoking status: Former Smoker    Quit date: 2014  . Smokeless tobacco: Never Used     Comment: used to smoke socially  . Alcohol use 0.0 oz/week     Comment: once a year  . Drug use: No  . Sexual activity: No   Other Topics Concern  . None   Social History Narrative  . None    Past Surgical History:  Procedure Laterality Date  . bunionectomy     . EYE SURGERY    .  TONSILLECTOMY AND ADENOIDECTOMY      Family History  Problem Relation Age of Onset  . Hypertension Mother   . Cancer Father   . Hypertension Father   . Cancer Brother   . Diabetes Maternal Uncle     Review of Systems  Constitutional: Negative for activity change, appetite change, chills, diaphoresis, fatigue, fever and unexpected weight change.  HENT: Negative for congestion, dental problem, drooling, ear discharge, ear pain, facial swelling, hearing loss, mouth sores, nosebleeds, postnasal drip, rhinorrhea, sinus pain, sinus pressure, sneezing, sore throat, tinnitus, trouble swallowing and voice change.   Eyes: Negative for photophobia, pain, discharge, redness, itching and visual disturbance.  Respiratory: Negative for apnea, cough, choking, chest tightness, shortness of  breath, wheezing and stridor.   Cardiovascular: Negative for chest pain, palpitations and leg swelling.  Gastrointestinal: Negative for abdominal distention, abdominal pain, anal bleeding, blood in stool, constipation, diarrhea, nausea, rectal pain and vomiting.  Endocrine: Negative for cold intolerance, heat intolerance, polydipsia, polyphagia and polyuria.  Genitourinary: Negative for decreased urine volume, difficulty urinating, dyspareunia, dysuria, enuresis, flank pain, frequency, genital sores, hematuria, menstrual problem, pelvic pain, urgency, vaginal bleeding, vaginal discharge and vaginal pain.  Musculoskeletal: Negative for arthralgias, back pain, gait problem, joint swelling, myalgias, neck pain and neck stiffness.  Skin: Negative for color change, pallor, rash and wound.  Allergic/Immunologic: Negative for environmental allergies, food allergies and immunocompromised state.  Neurological: Negative for dizziness, tremors, seizures, syncope, facial asymmetry, speech difficulty, weakness, light-headedness, numbness and headaches.  Hematological: Negative for adenopathy. Does not bruise/bleed easily.  Psychiatric/Behavioral: Negative for agitation, behavioral problems, confusion, decreased concentration, dysphoric mood, hallucinations, self-injury, sleep disturbance and suicidal ideas. The patient is not nervous/anxious and is not hyperactive.     Objective:  BP 120/78 (BP Location: Left Arm, Patient Position: Sitting, Cuff Size: Normal)   Pulse 99   Temp 98.8 F (37.1 C) (Oral)   Resp 18   Ht 5' 6.26" (1.683 m)   Wt 165 lb 12.8 oz (75.2 kg)   LMP 09/02/2017 (Approximate)   SpO2 99%   BMI 26.55 kg/m   Physical Exam  Constitutional: She is oriented to person, place, and time and well-developed, well-nourished, and in no distress.  HENT:  Head: Normocephalic and atraumatic.  Right Ear: Hearing, tympanic membrane, external ear and ear canal normal.  Left Ear: Hearing, tympanic  membrane, external ear and ear canal normal.  Nose: Nose normal.  Mouth/Throat: Uvula is midline, oropharynx is clear and moist and mucous membranes are normal. No oropharyngeal exudate.  Eyes: Pupils are equal, round, and reactive to light. Conjunctivae, EOM and lids are normal. No scleral icterus.  Neck: Trachea normal and normal range of motion. No thyroid mass and no thyromegaly present.  Cardiovascular: Normal rate, regular rhythm, normal heart sounds and intact distal pulses.   Pulmonary/Chest: Effort normal and breath sounds normal.  Abdominal: Soft. Normal appearance and bowel sounds are normal. There is no tenderness.  Lymphadenopathy:       Head (right side): No tonsillar, no preauricular, no posterior auricular and no occipital adenopathy present.       Head (left side): No tonsillar, no preauricular, no posterior auricular and no occipital adenopathy present.    She has no cervical adenopathy.       Right: No supraclavicular adenopathy present.       Left: No supraclavicular adenopathy present.  Neurological: She is alert and oriented to person, place, and time. She has normal sensation, normal strength and normal reflexes. Gait normal.  Skin: Skin is warm and dry.  Psychiatric: Affect normal.    Visual Acuity Screening   Right eye Left eye Both eyes  Without correction: '20/15 20/15 20/15 '$  With correction:      No results found for this or any previous visit (from the past 24 hour(s)).  Assessment and Plan :  Discussed healthy lifestyle, diet, exercise, preventative care, vaccinations, and addressed patient's concerns. Plan for follow up in 6 months. Otherwise, plan for specific conditions below. 1. Annual physical exam Pt to return later this week for lab only visit when she is fasting. Orders have been placed.   2. Anxiety state Well controlled at this time. Continue atarax 12.'5mg'$  as needed for anxiety. Goal is to not have to take nightly. Encouraged pt to attempt  decreasing frequency of use after she moves in to her own place and starts to feel more settled. Plan to follow up in office in 6 months or sooner if sx worsen.  - hydrOXYzine (ATARAX/VISTARIL) 25 MG tablet; Take 0.5 tablets (12.5 mg total) by mouth every 8 (eight) hours as needed for itching.  Dispense: 90 tablet; Refill: 0  3. Screening, anemia, deficiency, iron - CBC with Differential/Platelet; Future  4. Screening, lipid - Lipid panel; Future  5. Screening for thyroid disorder - TSH; Future  6. Screening for hematuria or proteinuria - POCT urinalysis dipstick, Future  7. Screening for metabolic disorder - QUI11+OYWV; Future  Tenna Delaine, PA-C  [facility] 09/17/2017 10:29 AM

## 2017-09-27 ENCOUNTER — Ambulatory Visit (INDEPENDENT_AMBULATORY_CARE_PROVIDER_SITE_OTHER): Payer: BLUE CROSS/BLUE SHIELD | Admitting: Physician Assistant

## 2017-09-27 DIAGNOSIS — Z1329 Encounter for screening for other suspected endocrine disorder: Secondary | ICD-10-CM

## 2017-09-27 DIAGNOSIS — Z13228 Encounter for screening for other metabolic disorders: Secondary | ICD-10-CM | POA: Diagnosis not present

## 2017-09-27 DIAGNOSIS — Z130 Encounter for screening for diseases of the blood and blood-forming organs and certain disorders involving the immune mechanism: Secondary | ICD-10-CM

## 2017-09-27 DIAGNOSIS — Z13220 Encounter for screening for lipoid disorders: Secondary | ICD-10-CM | POA: Diagnosis not present

## 2017-09-27 DIAGNOSIS — Z1389 Encounter for screening for other disorder: Secondary | ICD-10-CM

## 2017-09-28 LAB — CMP14+EGFR
ALT: 10 IU/L (ref 0–32)
AST: 14 IU/L (ref 0–40)
Albumin/Globulin Ratio: 2 (ref 1.2–2.2)
Albumin: 4.5 g/dL (ref 3.5–5.5)
Alkaline Phosphatase: 58 IU/L (ref 39–117)
BUN/Creatinine Ratio: 16 (ref 9–23)
BUN: 13 mg/dL (ref 6–20)
Bilirubin Total: 0.6 mg/dL (ref 0.0–1.2)
CO2: 22 mmol/L (ref 20–29)
Calcium: 9.4 mg/dL (ref 8.7–10.2)
Chloride: 104 mmol/L (ref 96–106)
Creatinine, Ser: 0.8 mg/dL (ref 0.57–1.00)
GFR calc Af Amer: 114 mL/min/{1.73_m2} (ref >59)
GFR calc non Af Amer: 99 mL/min/{1.73_m2} (ref >59)
Globulin, Total: 2.2 g/dL (ref 1.5–4.5)
Glucose: 85 mg/dL (ref 65–99)
Potassium: 4.7 mmol/L (ref 3.5–5.2)
Sodium: 138 mmol/L (ref 134–144)
Total Protein: 6.7 g/dL (ref 6.0–8.5)

## 2017-09-28 LAB — CBC WITH DIFFERENTIAL/PLATELET
Basophils Absolute: 0 10*3/uL (ref 0.0–0.2)
Basos: 1 %
EOS (ABSOLUTE): 0.2 10*3/uL (ref 0.0–0.4)
Eos: 4 %
Hematocrit: 40.6 % (ref 34.0–46.6)
Hemoglobin: 13.7 g/dL (ref 11.1–15.9)
Immature Grans (Abs): 0 10*3/uL (ref 0.0–0.1)
Immature Granulocytes: 0 %
Lymphocytes Absolute: 1.6 10*3/uL (ref 0.7–3.1)
Lymphs: 35 %
MCH: 29.4 pg (ref 26.6–33.0)
MCHC: 33.7 g/dL (ref 31.5–35.7)
MCV: 87 fL (ref 79–97)
Monocytes Absolute: 0.4 10*3/uL (ref 0.1–0.9)
Monocytes: 10 %
Neutrophils Absolute: 2.3 10*3/uL (ref 1.4–7.0)
Neutrophils: 50 %
Platelets: 175 10*3/uL (ref 150–379)
RBC: 4.66 x10E6/uL (ref 3.77–5.28)
RDW: 12.4 % (ref 12.3–15.4)
WBC: 4.6 10*3/uL (ref 3.4–10.8)

## 2017-09-28 LAB — LIPID PANEL
Chol/HDL Ratio: 3.9 ratio (ref 0.0–4.4)
Cholesterol, Total: 155 mg/dL (ref 100–199)
HDL: 40 mg/dL (ref >39)
LDL Calculated: 106 mg/dL — ABNORMAL HIGH (ref 0–99)
Triglycerides: 47 mg/dL (ref 0–149)
VLDL Cholesterol Cal: 9 mg/dL (ref 5–40)

## 2017-09-28 LAB — TSH: TSH: 1.15 u[IU]/mL (ref 0.450–4.500)

## 2017-10-02 ENCOUNTER — Encounter: Payer: Self-pay | Admitting: Radiology

## 2017-10-02 ENCOUNTER — Telehealth: Payer: Self-pay | Admitting: Physician Assistant

## 2017-10-02 NOTE — Telephone Encounter (Signed)
Pt advised.

## 2017-10-02 NOTE — Telephone Encounter (Unsigned)
Copied from CRM [phone]#[id number]. Topic: Inquiry >> Oct 02, 2017  9:44 AM Raquel SarnaHayes, Teresa G wrote: Pt received a letter about cholesterol.  Wants to speak with someone to understand the results.

## 2017-10-22 ENCOUNTER — Ambulatory Visit: Payer: BLUE CROSS/BLUE SHIELD | Admitting: Sports Medicine

## 2017-10-22 ENCOUNTER — Other Ambulatory Visit: Payer: Self-pay | Admitting: Physician Assistant

## 2017-10-22 DIAGNOSIS — F411 Generalized anxiety disorder: Secondary | ICD-10-CM

## 2017-11-25 ENCOUNTER — Ambulatory Visit: Payer: BLUE CROSS/BLUE SHIELD | Admitting: Physician Assistant

## 2017-11-25 ENCOUNTER — Encounter: Payer: Self-pay | Admitting: Physician Assistant

## 2017-11-25 ENCOUNTER — Other Ambulatory Visit: Payer: Self-pay

## 2017-11-25 VITALS — BP 116/68 | HR 99 | Temp 98.3°F | Resp 18 | Ht 66.14 in | Wt 163.4 lb

## 2017-11-25 DIAGNOSIS — R21 Rash and other nonspecific skin eruption: Secondary | ICD-10-CM | POA: Diagnosis not present

## 2017-11-25 DIAGNOSIS — Z8719 Personal history of other diseases of the digestive system: Secondary | ICD-10-CM | POA: Diagnosis not present

## 2017-11-25 DIAGNOSIS — Z8711 Personal history of peptic ulcer disease: Secondary | ICD-10-CM

## 2017-11-25 DIAGNOSIS — Z79899 Other long term (current) drug therapy: Secondary | ICD-10-CM | POA: Diagnosis not present

## 2017-11-25 MED ORDER — CEPHALEXIN 500 MG PO CAPS: 500.0000 mg | ORAL_CAPSULE | Freq: Two times a day (BID) | ORAL | 0 refills | Status: AC

## 2017-11-25 MED ORDER — DOXYCYCLINE HYCLATE 100 MG PO CAPS: 100.0000 mg | ORAL_CAPSULE | Freq: Two times a day (BID) | ORAL | 0 refills | Status: DC

## 2017-11-25 MED ORDER — OMEPRAZOLE 40 MG PO CPDR: 40.0000 mg | DELAYED_RELEASE_CAPSULE | Freq: Every day | ORAL | 0 refills | Status: DC

## 2017-11-25 NOTE — Patient Instructions (Addendum)
Your lesions consistent with impetigo.  Please read information below regarding this disorder.  Because topical treatment does not work, we are going to treat it with oral antibiotics.  Please take as prescribed.  For heartburn, I have given you refills for omeprazole.  Please take as prescribed.  I also recommend he follow-up with GI at least one additional time to discuss continue symptoms when you are off medication.   Impetigo, Adult Impetigo is an infection of the skin. It commonly occurs in young children, but it can also occur in adults. The infection causes itchy blisters and sores that produce brownish-yellow fluid. As the fluid dries, it forms a thick, honey-colored crust. These skin changes usually occur on the face but can also affect other areas of the body. Impetigo usually goes away in 7-10 days with treatment. What are the causes? Impetigo is caused by two types of bacteria. It may be caused by staphylococci or streptococci bacteria. These bacteria cause impetigo when they get under the surface of the skin. This often happens after some damage to the skin, such as damage from:  Cuts, scrapes, or scratches.  Insect bites, especially when you scratch the area of a bite.  Chickenpox or other illnesses that cause open skin sores.  Nail biting or chewing.  Impetigo is contagious and can spread easily from one person to another. This may occur through close skin contact or by sharing towels, clothing, or other items with a person who has the infection. What increases the risk? Some things that can increase the risk of getting this infection include:  Playing sports that include skin-to-skin contact with others.  Having a skin condition with open sores.  Having many skin cuts or scrapes.  Living in an area that has high humidity levels.  Having poor hygiene.  Having high levels of staphylococci in your nose.  What are the signs or symptoms? Impetigo usually starts out as  small blisters, often on the face. The blisters then break open and turn into tiny sores (lesions) with a yellow crust. In some cases, the blisters cause itching or burning. With scratching, irritation, or lack of treatment, these small lesions may get larger. Scratching can also cause impetigo to spread to other parts of the body. The bacteria can get under the fingernails and spread when you touch another area of your skin. Other possible symptoms include:  Larger blisters.  Pus.  Swollen lymph glands.  How is this diagnosed? This condition is usually diagnosed during a physical exam. A skin sample or sample of fluid from a blister may be taken for lab tests that involve growing bacteria (culture test). This can help confirm the diagnosis or help determine the best treatment. How is this treated? Mild impetigo can be treated with prescription antibiotic cream. Oral antibiotic medicine may be used in more severe cases. Medicines for itching may also be used. Follow these instructions at home:  Take medicines only as directed by your health care provider.  To help prevent impetigo from spreading to other body areas: ? Keep your fingernails short and clean. ? Do not scratch the blisters or sores. ? Cover infected areas, if necessary, to keep from scratching.  Gently wash the infected areas with antibiotic soap and water.  Soak crusted areas in warm, soapy water using antibiotic soap. ? Gently rub the areas to remove crusts. Do not scrub.  Wash your hands often to avoid spreading this infection.  Stay home until you have used an antibiotic  cream for 48 hours (2 days) or an oral antibiotic medicine for 24 hours (1 day). You should only return to work and activities with other people if your skin shows significant improvement. How is this prevented? To keep the infection from spreading:  Stay home until you have used an antibiotic cream for 48 hours or an oral antibiotic for 24  hours.  Wash your hands often.  Do not engage in skin-to-skin contact with other people while you have still have blisters.  Do not share towels, washcloths, or bedding with others while you have the infection.  Contact a health care provider if:  You develop more blisters or sores despite treatment.  Other family members get sores.  Your skin sores are not improving after 48 hours of treatment.  You have a fever. Get help right away if:  You see spreading redness or swelling of the skin around your sores.  You see red streaks coming from your sores.  You develop a sore throat. This information is not intended to replace advice given to you by your health care provider. Make sure you discuss any questions you have with your health care provider. Document Released: 12/03/2014 Document Revised: 04/19/2016 Document Reviewed: 10/26/2014 Elsevier Interactive Patient Education  2017 [employer]   IF you received an x-ray today, you will receive an invoice from [facility]. Please contact [facility] at [phone] with questions or concerns regarding your invoice.   IF you received labwork today, you will receive an invoice from [facility]. Please contact [facility] at [phone] with questions or concerns regarding your invoice.   Our billing staff will not be able to assist you with questions regarding bills from these companies.  You will be contacted with the lab results as soon as they are available. The fastest way to get your results is to activate your My Chart account. Instructions are located on the last page of this paperwork. If you have not heard from us regarding the results in 2 weeks, please contact this office.

## 2017-11-25 NOTE — Progress Notes (Signed)
Stacy LollingJessica A Wilkins  MRN: [id number] DOB: 05-Apr-1986  Subjective:  Stacy Wilkins is a 31 y.o. female seen in office today for a chief complaint of multiple complaints.  1) Medicaton refill: Needs medication refill for Prilosec.  Has history of gastric ulcers.  Last seen by GI in 08/2016.  Last endoscopy was around 08/2017. +for gastric ulcers. Instructed to take prilosec 40mg  BID x 3 months then decrease to once daily. Has been out of medication for a week and has had heartburn daily since. Has had some belching as well. Denies epigastric pain, nausea, vomiting, and dysphagia.  Is planning on following up with GI soon but could not get in quickly.  She tries to avoid NSAID use but does note that she has been using it occasionally for intermittent headaches in the past couple weeks.  2) Facial lesion: Patient has also had a lesion on her chin for 3 weeks.  She notes it is painful and burning.  Notes she has tried topical mupirocin ointment that I had given her in the past at least twice a day for 1 week.  She has also tried oral doxycycline 20 mg daily for the past week.  She has not had any improvement in the lesion.  She continues to pick at the lesion because it is irritating.  Denies fever, chills, tingling, itching, numbness, and tingling.  There is no nose involvement.  She has no history of HSV.  She has had no contact with individuals with similar lesions.  Denies smoking.  No history of MRSA.  Review of Systems  Per HPI   Patient Active Problem List   Diagnosis Date Noted  . Anxiety 09/16/2017  . History of DVT (deep vein thrombosis) 03/21/2017  . History of pulmonary embolism 03/21/2017  . Food allergy 11/08/2016  . Palpitations 02/27/2016  . Hemorrhoids 09/29/2015    Current Outpatient Medications on File Prior to Visit  Medication Sig Dispense Refill  . cetirizine (ZYRTEC) 10 MG tablet Take 1 tablet (10 mg total) by mouth daily. 30 tablet 11  . etonogestrel (NEXPLANON) 68 MG  IMPL implant 1 each by Subdermal route once.    . fluticasone (FLONASE) 50 MCG/ACT nasal spray SPRAY TWICE INTO EACH NOSTRIL EVERY DAY 16 g 2  . hydrOXYzine (ATARAX/VISTARIL) 25 MG tablet Take 0.5 tablets (12.5 mg total) by mouth every 8 (eight) hours as needed for itching. 90 tablet 0  . hydrOXYzine (ATARAX/VISTARIL) 25 MG tablet TAKE 1/2 TO 1 TABLET BY MOUTH EVERY 8 HOURS AS NEEDED ITCHING 30 tablet 0  . Multiple Vitamins-Minerals (THERA-M) TABS Take 1 tablet by mouth daily.      Current Facility-Administered Medications on File Prior to Visit  Medication Dose Route Frequency Provider Last Rate Last Dose  . triamcinolone acetonide (KENALOG) 10 MG/ML injection 10 mg  10 mg Other Once Asencion IslamStover, Titorya, DPM        Allergies  Allergen Reactions  . Esomeprazole Magnesium Other (See Comments)     Unknown reaction   . Cabbage   . Mustard Seed   . Onion   . Other     Green peppers  . Tomato      Objective:  BP 116/68 (BP Location: Left Arm, Patient Position: Sitting, Cuff Size: Normal)   Pulse 99   Temp 98.3 F (36.8 C) (Oral)   Resp 18   Ht 5' 6.14" (1.68 m)   Wt 163 lb 6.4 oz (74.1 kg)   SpO2 99%   BMI 26.26  kg/m   Physical Exam  Constitutional: She is oriented to person, place, and time and well-developed, well-nourished, and in no distress.  HENT:  Head: Normocephalic and atraumatic.  Eyes: Conjunctivae are normal.  Neck: Normal range of motion.  Pulmonary/Chest: Effort normal.  Abdominal: Soft. Normal appearance. There is no tenderness.  Neurological: She is alert and oriented to person, place, and time. Gait normal.  Skin: Skin is warm and dry. Rash (erythemtaous honey crusted plaque noted on chin, see image below) noted.  Psychiatric: Affect normal.  Vitals reviewed.     Assessment and Plan :  1. Current use of proton pump inhibitor Refills provided. Labs pending.  Patient encouraged to avoid NSAID use.  Follow-up with GI as planned.   - Magnesium - omeprazole  (PRILOSEC) 40 MG capsule; Take 1 capsule (40 mg total) by mouth daily.  Dispense: 90 capsule; Refill: 0 2. History of gastric ulcer - omeprazole (PRILOSEC) 40 MG capsule; Take 1 capsule (40 mg total) by mouth daily.  Dispense: 90 capsule; Refill: 0  3. Rash and nonspecific skin eruption Physical exam findings consistent with impetigo.  Patient has failed topical treatment.  She is on low-dose doxycycline for acne.  Will give Rx for Keflex at this time.  Given educational material on impetigo.  Encouraged to picking at the lesion.  Patient encouraged to return to clinic if symptoms worsen, do not improve in 7 days, or as needed. - cephALEXin (KEFLEX) 500 MG capsule; Take 1 capsule (500 mg total) by mouth 2 (two) times daily for 7 days.  Dispense: 14 capsule; Refill: 0  Benjiman CoreBrittany Wiseman PA-C  [facility]  [facility] 11/25/2017 6:54 PM

## 2017-11-26 LAB — MAGNESIUM: Magnesium: 2.5 mg/dL — ABNORMAL HIGH (ref 1.6–2.3)

## 2017-12-03 DIAGNOSIS — L0100 Impetigo, unspecified: Secondary | ICD-10-CM | POA: Diagnosis not present

## 2017-12-05 DIAGNOSIS — L539 Erythematous condition, unspecified: Secondary | ICD-10-CM | POA: Diagnosis not present

## 2017-12-16 ENCOUNTER — Ambulatory Visit: Payer: BLUE CROSS/BLUE SHIELD | Admitting: Physician Assistant

## 2017-12-23 ENCOUNTER — Ambulatory Visit: Payer: BLUE CROSS/BLUE SHIELD | Admitting: Physician Assistant

## 2017-12-25 ENCOUNTER — Other Ambulatory Visit: Payer: Self-pay

## 2017-12-25 ENCOUNTER — Ambulatory Visit (INDEPENDENT_AMBULATORY_CARE_PROVIDER_SITE_OTHER): Payer: BLUE CROSS/BLUE SHIELD | Admitting: Physician Assistant

## 2017-12-25 ENCOUNTER — Encounter: Payer: Self-pay | Admitting: Physician Assistant

## 2017-12-25 VITALS — BP 114/72 | HR 86 | Temp 98.8°F | Resp 18 | Ht 66.14 in | Wt 165.0 lb

## 2017-12-25 DIAGNOSIS — F411 Generalized anxiety disorder: Secondary | ICD-10-CM

## 2017-12-25 DIAGNOSIS — Z7189 Other specified counseling: Secondary | ICD-10-CM

## 2017-12-25 DIAGNOSIS — K208 Other esophagitis: Secondary | ICD-10-CM

## 2017-12-25 DIAGNOSIS — Z7185 Encounter for immunization safety counseling: Secondary | ICD-10-CM

## 2017-12-25 DIAGNOSIS — T50905A Adverse effect of unspecified drugs, medicaments and biological substances, initial encounter: Secondary | ICD-10-CM

## 2017-12-25 MED ORDER — SUCRALFATE 1 GM/10ML PO SUSP: 1.0000 g | Freq: Three times a day (TID) | ORAL | 0 refills | Status: DC

## 2017-12-25 MED ORDER — HYDROXYZINE HCL 25 MG PO TABS: 12.5000 mg | ORAL_TABLET | Freq: Three times a day (TID) | ORAL | 0 refills | Status: DC | PRN

## 2017-12-25 NOTE — Progress Notes (Signed)
Stacy Wilkins  MRN: [id number] DOB: 01-Oct-1986  Subjective:  Stacy Wilkins is a 32 y.o. female seen in office today for multiple issues.   1) Medication reaction: She was put on doxy 3 weeks ago for skin issues. A few days after being on it after swallowing the medicaiton, she felt like it got stuck in her throat. She did not take it with a lot of water. Notes she could feel it slowly move down her esophagus throughout the day. Discontinued doxy at that time. Went to ENT the next day (has connection through work?) and had them run camera down her nose and he said that the upper portion of esophagus was ulcerated but could not see all the way down. Was given medication for mouth rinse she did not pick up. Today, notes her symptoms have improved. She is still having retrosternal pain. Notes it feels like her chest is going to rip open. Worsened with certain movements. Denies hematemesis, abdominal pain, nausea, vomiting, odonyphagia, and dyshagia. Takes daily omeprazole.   2) Needs to make sure she is up to date on Tdap vaccine. According to her records, she last received Tdap on 02/07/2014.   3) Medication refill: Patient needs medication refill for Atarax.  She is to use it nightly for anxiety but would like to start using it as needed for anxiety because she does not want to be on a daily.  She has tried multiple long-term medications in the past and does not like the way they make her feel.  Notes that when she is on long-term medication, she feels emotionless. Review of Systems  Constitutional: Negative for chills, diaphoresis and fever.  Respiratory: Negative for cough and shortness of breath.   Cardiovascular: Negative for palpitations and leg swelling.  Psychiatric/Behavioral: Negative for dysphoric mood.     Patient Active Problem List   Diagnosis Date Noted  . Anxiety 09/16/2017  . History of DVT (deep vein thrombosis) 03/21/2017  . History of pulmonary embolism 03/21/2017  .  Food allergy 11/08/2016  . Palpitations 02/27/2016  . Hemorrhoids 09/29/2015    Current Outpatient Medications on File Prior to Visit  Medication Sig Dispense Refill  . cetirizine (ZYRTEC) 10 MG tablet Take 1 tablet (10 mg total) by mouth daily. 30 tablet 11  . etonogestrel (NEXPLANON) 68 MG IMPL implant 1 each by Subdermal route once.    . fluticasone (FLONASE) 50 MCG/ACT nasal spray SPRAY TWICE INTO EACH NOSTRIL EVERY DAY 16 g 2  . Multiple Vitamins-Minerals (THERA-M) TABS Take 1 tablet by mouth daily.     Marland Kitchen omeprazole (PRILOSEC) 40 MG capsule Take 1 capsule (40 mg total) by mouth daily. 90 capsule 0   Current Facility-Administered Medications on File Prior to Visit  Medication Dose Route Frequency Provider Last Rate Last Dose  . triamcinolone acetonide (KENALOG) 10 MG/ML injection 10 mg  10 mg Other Once Asencion Islam, DPM        Allergies  Allergen Reactions  . Esomeprazole Magnesium Other (See Comments)     Unknown reaction   . Cabbage   . Mustard Seed   . Onion   . Other     Green peppers  . Tomato       Social History   Socioeconomic History  . Marital status: Single    Spouse name: Not on file  . Number of children: Not on file  . Years of education: Not on file  . Highest education level: Not on file  Social  Needs  . Financial resource strain: Not on file  . Food insecurity - worry: Not on file  . Food insecurity - inability: Not on file  . Transportation needs - medical: Not on file  . Transportation needs - non-medical: Not on file  Occupational History  . Occupation: Billing/Insurance    Comment: [employer]  Tobacco Use  . Smoking status: Former Smoker    Last attempt to quit: 2014    Years since quitting: 5.0  . Smokeless tobacco: Never Used  . Tobacco comment: used to smoke socially  Substance and Sexual Activity  . Alcohol use: Yes    Alcohol/week: 0.0 oz    Comment: once a year  . Drug use: No  . Sexual activity: Not  Currently    Birth control/protection: Implant  Other Topics Concern  . Not on file  Social History Narrative  . Not on file    Objective:  BP 114/72   Pulse 86   Temp 98.8 F (37.1 C) (Oral)   Resp 18   Ht 5' 6.14" (1.68 m)   Wt 165 lb (74.8 kg)   SpO2 98%   BMI 26.52 kg/m   Physical Exam  Constitutional: She is oriented to person, place, and time and well-developed, well-nourished, and in no distress.  HENT:  Head: Normocephalic and atraumatic.  Mouth/Throat: Uvula is midline, oropharynx is clear and moist and mucous membranes are normal.  Eyes: Conjunctivae are normal.  Neck: Normal range of motion.  Cardiovascular: Normal rate, regular rhythm and normal heart sounds.  Pulmonary/Chest: Effort normal and breath sounds normal. She has no wheezes. She has no rhonchi. She has no rales. She exhibits no tenderness and no bony tenderness.  Neurological: She is alert and oriented to person, place, and time. Gait normal.  Skin: Skin is warm and dry.  Psychiatric: Affect normal.  Vitals reviewed.   Assessment and Plan :  1. Pill esophagitis Considering patient is still symptomatic 2.5 weeks after discontinuing doxycycline, will refer urgently to GI at this time as she will likely need endoscopy.  No red flag symptoms noted.  She is overall well-appearing, no distress.  Vitals stable.  Given Rx for Carafate.  Recommended to continue daily omeprazole.  Also educated that she can add daily Zantac to the regimen for further symptomatic treatment.  Given strict ED/return precautions. - sucralfate (CARAFATE) 1 GM/10ML suspension; Take 10 mLs (1 g total) by mouth 4 (four) times daily -  with meals and at bedtime.  Dispense: 420 mL; Refill: 0 - Ambulatory referral to Gastroenterology  2. Immunization counseling Up to date on Tdap.  3. Anxiety state Encouraged her to attempt to use hydroxyzine as needed for anxiety.  Follow-up in 3 months for reevaluation.  Return sooner if symptoms  worsen or she develops new concerning symptoms. - hydrOXYzine (ATARAX/VISTARIL) 25 MG tablet; Take 0.5-1 tablets (12.5-25 mg total) by mouth every 8 (eight) hours as needed for anxiety.  Dispense: 30 tablet; Refill: 0   Benjiman CoreBrittany Wiseman PA-C  [facility]  [facility] 12/25/2017 9:26 PM

## 2017-12-25 NOTE — Patient Instructions (Addendum)
For your chest pain, I recommend continue drinking at least 64 ounces of water a day.  Continue taking omeprazole.  I have given you a prescription for Carafate to use up to 3 times a day for the pain. You may also use over the counter zantac twice a day.  I have placed a referral to GI and they should contact you within the next few days.  If you have not heard from them in the next few days, go to your scheduled appointment at [facility] on 12/30/17. If any of your symptoms worsen or you develop vomiting with blood, worsening chest pain, or inability to swallow, seek care immediately.  For anxiety, please take hydroxyzine as needed.  I recommend trying it over the weekend during the day to see if it makes her really drowsy.  In terms of her immunizations, you are up-to-date on Tdap.  Please follow-up as needed.    Esophagitis Esophagitis is inflammation of the esophagus. The esophagus is the tube that carries food and liquids from your mouth to your stomach. Esophagitis can cause soreness or pain in the esophagus. This condition can make it difficult and painful to swallow. What are the causes? Most causes of esophagitis are not serious. Common causes of this condition include:  Gastroesophageal reflux disease (GERD). This is when stomach contents move back up into the esophagus (reflux).  Repeated vomiting.  An allergic-type reaction, especially caused by food allergies (eosinophilic esophagitis).  Injury to the esophagus by swallowing large pills with or without water, or swallowing certain types of medicines.  Swallowing (ingesting) harmful chemicals, such as household cleaning products.  Heavy alcohol use.  An infection of the esophagus.This most often occurs in people who have a weakened immune system.  Radiation or chemotherapy treatment for cancer.  Certain diseases such as sarcoidosis, Crohn disease, and scleroderma.  What are the signs or symptoms? Symptoms of this condition  include:  Difficult or painful swallowing.  Pain with swallowing acidic liquids, such as citrus juices.  Pain with burping.  Chest pain.  Difficulty breathing.  Nausea.  Vomiting.  Pain in the abdomen.  Weight loss.  Ulcers in the mouth.  Patches of white material in the mouth (candidiasis).  Fever.  Coughing up blood or vomiting blood.  Stool that is black, tarry, or bright red.  How is this diagnosed? Your health care provider will take a medical history and perform a physical exam. You may also have other tests, including:  An endoscopy to examine your stomach and esophagus with a small camera.  A test that measures the acidity level in your esophagus.  A test that measures how much pressure is on your esophagus.  A barium swallow or modified barium swallow to show the shape, size, and functioning of your esophagus.  Allergy tests.  How is this treated? Treatment for this condition depends on the cause of your esophagitis. In some cases, steroids or other medicines may be given to help relieve your symptoms or to treat the underlying cause of your condition. You may have to make some lifestyle changes, such as:  Avoiding alcohol.  Quitting smoking.  Changing your diet.  Exercising.  Changing your sleep habits and your sleep environment.  Follow these instructions at home: Take these actions to decrease your discomfort and to help avoid complications. Diet  Follow a diet as recommended by your health care provider. This may involve avoiding foods and drinks such as: ? Coffee and tea (with or without caffeine). ?  Drinks that contain alcohol. ? Energy drinks and sports drinks. ? Carbonated drinks or sodas. ? Chocolate and cocoa. ? Peppermint and mint flavorings. ? Garlic and onions. ? Horseradish. ? Spicy and acidic foods, including peppers, chili powder, curry powder, vinegar, hot sauces, and barbecue sauce. ? Citrus fruit juices and citrus  fruits, such as oranges, lemons, and limes. ? Tomato-based foods, such as red sauce, chili, salsa, and pizza with red sauce. ? Fried and fatty foods, such as donuts, french fries, potato chips, and high-fat dressings. ? High-fat meats, such as hot dogs and fatty cuts of red and white meats, such as rib eye steak, sausage, ham, and bacon. ? High-fat dairy items, such as whole milk, butter, and cream cheese.  Eat small, frequent meals instead of large meals.  Avoid drinking large amounts of liquid with your meals.  Avoid eating meals during the 2-3 hours before bedtime.  Avoid lying down right after you eat.  Do not exercise right after you eat.  Avoid foods and drinks that seem to make your symptoms worse. General instructions  Pay attention to any changes in your symptoms.  Take over-the-counter and prescription medicines only as told by your health care provider. Do not take aspirin, ibuprofen, or other NSAIDs unless your health care provider told you to do so.  If you have trouble taking pills, use a pill splitter to decrease the size of the pill. This will decrease the chance of the pill getting stuck or injuring your esophagus on the way down. Also, drink water after you take a pill.  Do not use any tobacco products, including cigarettes, chewing tobacco, and e-cigarettes. If you need help quitting, ask your health care provider.  Wear loose-fitting clothing. Do not wear anything tight around your waist that causes pressure on your abdomen.  Raise (elevate) the head of your bed about 6 inches (15 cm).  Try to reduce your stress, such as with yoga or meditation. If you need help reducing stress, ask your health care provider.  If you are overweight, reduce your weight to an amount that is healthy for you. Ask your health care provider for guidance about a safe weight loss goal.  Keep all follow-up visits as told by your health care provider. This is important. Contact a health  care provider if:  You have new symptoms.  You have unexplained weight loss.  You have difficulty swallowing, or it hurts to swallow.  You have wheezing or a persistent cough.  Your symptoms do not improve with treatment.  You have frequent heartburn for more than two weeks. Get help right away if:  You have severe pain in your arms, neck, jaw, teeth, or back.  You feel sweaty, dizzy, or light-headed.  You have chest pain or shortness of breath.  You vomit and your vomit looks like blood or coffee grounds.  Your stool is bloody or black.  You have a fever.  You cannot swallow, drink, or eat. This information is not intended to replace advice given to you by your health care provider. Make sure you discuss any questions you have with your health care provider. Document Released: 12/20/2004 Document Revised: 04/19/2016 Document Reviewed: 03/09/2015 Elsevier Interactive Patient Education  2018 [employer]   IF you received an x-ray today, you will receive an invoice from [facility]. Please contact [facility] at [phone] with questions or concerns regarding your invoice.   IF you received labwork today, you will receive an invoice from [facility]. Please  contact [facility] at [phone] with questions or concerns regarding your invoice.   Our billing staff will not be able to assist you with questions regarding bills from these companies.  You will be contacted with the lab results as soon as they are available. The fastest way to get your results is to activate your My Chart account. Instructions are located on the last page of this paperwork. If you have not heard from [place] regarding the results in 2 weeks, please contact this office.

## 2017-12-26 ENCOUNTER — Telehealth: Payer: Self-pay | Admitting: Physician Assistant

## 2017-12-26 NOTE — Telephone Encounter (Signed)
Message sent to [place]any Re expense of Carafate

## 2017-12-26 NOTE — Telephone Encounter (Signed)
Copied from CRM [id number]. Topic: Quick Communication - See Telephone Encounter >> Dec 26, 2017 12:14 PM Rudi CocoLathan, Latoya M, NT wrote: CRM for notification. See Telephone encounter for:   12/26/17. Pt. Calling to let [place]Brittany Wiseman know that she went to the pharmacy to pick up her rx. For Carafate and its not covered under her insurance and it was $224.00 and she can not afford that was seeing if something else could be called in. And she would also like to have something called in for a cough. Pt. Can be reached at [phone]  [id number] - [place], [place] - [address] [facility] [address] [id number]  [place] [id number] Phone: [phone] Fax: [phone]

## 2017-12-26 NOTE — Telephone Encounter (Signed)
Rose could you look to see if there is a goodrx coupon for this ?

## 2017-12-27 NOTE — Telephone Encounter (Signed)
Patient calling back checking on the status of message below, patient stating cough has not improved, please advise

## 2017-12-30 DIAGNOSIS — M9902 Segmental and somatic dysfunction of thoracic region: Secondary | ICD-10-CM | POA: Diagnosis not present

## 2017-12-30 DIAGNOSIS — M9903 Segmental and somatic dysfunction of lumbar region: Secondary | ICD-10-CM | POA: Diagnosis not present

## 2017-12-30 DIAGNOSIS — M9901 Segmental and somatic dysfunction of cervical region: Secondary | ICD-10-CM | POA: Diagnosis not present

## 2017-12-31 ENCOUNTER — Ambulatory Visit: Payer: Self-pay

## 2017-12-31 NOTE — Telephone Encounter (Signed)
Please advise See message below about pt cough not improving Prices and coupons for 120 tablets of (Carafate) sucralfate 1g  Using Goodrx is around $30.00

## 2017-12-31 NOTE — Telephone Encounter (Signed)
Phone call from pt.  Reported she has a hx of DVT and PE.  Reported she has had intermittent chest pain between breasts, and constant back pain in left shoulder blade. Described the left shoulder blade pain as feeling "sharp and burning."   Reported she feels short of breath with activity.  C/o feeling light-headed yesterday, but that has subsided today. Stated she has been coughing over the past few days.  Also, c/o left calf pain today.  Denied any swelling of left LE.  Stated she is anxious about the possibility of having another PE.  Reported she is not on any blood thinners currently; stated she only took them for 3 mos., surrounding the DVT/ PE. Stated she did not have symptoms, previously when she was diagnosed with DVT and PE.    Stated she saw [place]any Wiseman last Thursday, and reported the above symptoms.  Per protocol, advised to go to the ER.  Pt. Is at work and stated she will call her sister to take her.     Reason for Disposition . History of prior "blood clot" in leg or lungs (i.e., deep vein thrombosis, pulmonary embolism)  Answer Assessment - Initial Assessment Questions 1. LOCATION: "Where does it hurt?"       Chest pain between breasts 2/10; constant back pain at shoulder blades 8/10 ; describes sharp burning pain left shoulder blade. 2. RADIATION: "Does the pain go anywhere else?" (e.g., into neck, jaw, arms, back)     Denied any radiation.  3. ONSET: "When did the chest pain begin?" (Minutes, hours or days)      Chest pain for approx. one week that comes and goes.  4. PATTERN "Does the pain come and go, or has it been constant since it started?"  "Does it get worse with exertion?"      Intermittent in ant. Chest; constant back pain at left shoulder blade  5. DURATION: "How long does it last" (e.g., seconds, minutes, hours)     Chest pain lasts about 30-60 min. Or it can be seconds; "like a tightness" 6. SEVERITY: "How bad is the pain?"  (e.g., Scale 1-10; mild, moderate, or  severe)    - MILD (1-3): doesn't interfere with normal activities     - MODERATE (4-7): interferes with normal activities or awakens from sleep    - SEVERE (8-10): excruciating pain, unable to do any normal activities       Chest pain is mild; back pain is severe 7. CARDIAC RISK FACTORS: "Do you have any history of heart problems or risk factors for heart disease?" (e.g., prior heart attack, angina; high blood pressure, diabetes, being overweight, high cholesterol, smoking, or strong family history of heart disease)     Denied  8. PULMONARY RISK FACTORS: "Do you have any history of lung disease?"  (e.g., blood clots in lung, asthma, emphysema, birth control pills)     Hx of pulmonary embolus 9. CAUSE: "What do you think is causing the chest pain?"     Voiced concern of PE due to hx.  10. OTHER SYMPTOMS: "Do you have any other symptoms?" (e.g., dizziness, nausea, vomiting, sweating, fever, difficulty breathing, cough)      Felt very light headed yesterday. Denied nausea / vomiting/ sweating. Stated some shortness of breath with activity; coughing over past few days.  11. PREGNANCY: "Is there any chance you are pregnant?" "When was your last menstrual period?"       No ; unsure of LMP; has Nexplanon  Protocols used: CHEST PAIN-A-AH

## 2018-01-01 ENCOUNTER — Encounter: Payer: Self-pay | Admitting: Physician Assistant

## 2018-01-01 NOTE — Telephone Encounter (Signed)
FYI

## 2018-01-02 DIAGNOSIS — M9901 Segmental and somatic dysfunction of cervical region: Secondary | ICD-10-CM | POA: Diagnosis not present

## 2018-01-02 DIAGNOSIS — M9904 Segmental and somatic dysfunction of sacral region: Secondary | ICD-10-CM | POA: Diagnosis not present

## 2018-01-02 DIAGNOSIS — M9903 Segmental and somatic dysfunction of lumbar region: Secondary | ICD-10-CM | POA: Diagnosis not present

## 2018-01-02 DIAGNOSIS — M9902 Segmental and somatic dysfunction of thoracic region: Secondary | ICD-10-CM | POA: Diagnosis not present

## 2018-01-02 MED ORDER — SUCRALFATE 1 G PO TABS: 1.0000 g | ORAL_TABLET | Freq: Four times a day (QID) | ORAL | 0 refills | Status: DC

## 2018-01-02 NOTE — Telephone Encounter (Signed)
Pt notified and verbalized understanding. I let her know we would check on her referral status.

## 2018-01-02 NOTE — Telephone Encounter (Signed)
[facility] today [facility] so they have not yet scheduled pt. I switched the referral to [facility] and left a vm for pt letting her know this and to call them to get an appt set up.

## 2018-01-02 NOTE — Telephone Encounter (Signed)
Please call patient.  When she  was seen in office, she was having symptoms from a doxycycline tablet getting stuck.  She could not afford Carafate suspension.  I have called in Carafate tablets.  She can take this 4 times a day with meals.  If her insurance does not cover this, there is good Rx prescriptions for this she can get online.  However she is supposed to be following up with GI for this issue.  The referral was sent [facility] to [facility] on 12/26/17.  Can you ask the patient if she was contacted by GI and if she is followed up?

## 2018-01-06 ENCOUNTER — Encounter: Payer: Self-pay | Admitting: Physician Assistant

## 2018-01-06 ENCOUNTER — Other Ambulatory Visit: Payer: Self-pay

## 2018-01-06 ENCOUNTER — Ambulatory Visit: Payer: BLUE CROSS/BLUE SHIELD | Admitting: Physician Assistant

## 2018-01-06 VITALS — BP 112/66 | HR 93 | Temp 98.4°F | Resp 18 | Ht 65.87 in | Wt 165.4 lb

## 2018-01-06 DIAGNOSIS — F419 Anxiety disorder, unspecified: Secondary | ICD-10-CM | POA: Diagnosis not present

## 2018-01-06 DIAGNOSIS — T50905A Adverse effect of unspecified drugs, medicaments and biological substances, initial encounter: Secondary | ICD-10-CM

## 2018-01-06 DIAGNOSIS — R072 Precordial pain: Secondary | ICD-10-CM

## 2018-01-06 DIAGNOSIS — K208 Other esophagitis without bleeding: Secondary | ICD-10-CM

## 2018-01-06 MED ORDER — RANITIDINE HCL 150 MG PO TABS: 150.0000 mg | ORAL_TABLET | Freq: Two times a day (BID) | ORAL | 0 refills | Status: DC

## 2018-01-06 MED ORDER — MAGIC MOUTHWASH W/LIDOCAINE: ORAL | 0 refills | Status: DC

## 2018-01-06 MED ORDER — MAGIC MOUTHWASH
5.0000 mL | Freq: Three times a day (TID) | ORAL | 0 refills | Status: DC
Start: 2018-01-06 — End: 2018-01-20

## 2018-01-06 MED ORDER — GI COCKTAIL ~~LOC~~
30.0000 mL | Freq: Once | ORAL | Status: AC
  Administered 2018-01-06: 30 mL via ORAL

## 2018-01-06 NOTE — Progress Notes (Signed)
Stacy LollingJessica A Weatherspoon  MRN: [id number] DOB: October 17, 1986  Subjective:  Stacy Wilkins is a 32 y.o. female seen in office today for a chief complaint of follow-up on pill esophagitis.  Patient initially seen on 12/25/17 for medication reaction to doxycycline.  A couple weeks prior to the visit, she felt a doxycycline tablet got stuck in her throat after taking it with multiple tablets and no water.  During that day, she felt it slowly move down her esophagus over the course of the day.  She went to ENT in the next day and had a scope ran up her nose to visualize the throat. She notes the ENT doctor said the upper portion of her esophagus was ulcerated.  He gave her a prescription for mouthwash and told her to follow-up with her primary care doctor.  During that visit, she was having continued retrosternal chest pain, which is worse with certain movements.  She has not taken doxycycline since the initial event.  She is on daily Prilosec.  She was encouraged to start Zantac twice a day and Carafate syrup.  Unfortunately, her insurance would not cover the Carafate syrup and she cannot afford it.  Prescription for Carafate tablets were sent in.  Given urgent referral to GI for further evaluation.  Today, she notes she has not followed up with GI because they canceled her original appointment.  Her new appointment is scheduled for 01/15/18.  She is continuing to have chest pain. She has not tried Zantac but has continued with Prilosec and Carafate tablets.  Notes it is not working.  Pain is worse when she leans forward. Denies hematemesis, abdominal pain, nausea, vomiting, odonyphagia, and dyshagia.  Patient is worried because she had a provoked DVT and PE from bunionectomy surgery in 02/2017 and anytime she has chest discomfort is concerned that it is going to be a blood clot.  She denies shortness of breath, dyspnea on exertion, heart palpitations, lower leg swelling, calf tenderness, hemoptysis, and exertional chest  pain.  Denies recent travel, hospitalizations, or mobilizations.  Denies smoking.  In terms of anxiety, she is using hydroxyzine as needed.  She does not want to be on a daily medication.  She doe is s not want to be in therapy.  Review of Systems  Constitutional: Negative for chills, diaphoresis and fever.  HENT: Negative for congestion.   Respiratory: Negative for cough and wheezing.   Neurological: Negative for dizziness and light-headedness.    Patient Active Problem List   Diagnosis Date Noted  . Anxiety 09/16/2017  . History of DVT (deep vein thrombosis) 03/21/2017  . History of pulmonary embolism 03/21/2017  . Food allergy 11/08/2016  . Palpitations 02/27/2016  . Hemorrhoids 09/29/2015    Current Outpatient Medications on File Prior to Visit  Medication Sig Dispense Refill  . cetirizine (ZYRTEC) 10 MG tablet Take 1 tablet (10 mg total) by mouth daily. 30 tablet 11  . etonogestrel (NEXPLANON) 68 MG IMPL implant 1 each by Subdermal route once.    . fluticasone (FLONASE) 50 MCG/ACT nasal spray SPRAY TWICE INTO EACH NOSTRIL EVERY DAY 16 g 2  . hydrOXYzine (ATARAX/VISTARIL) 25 MG tablet Take 0.5-1 tablets (12.5-25 mg total) by mouth every 8 (eight) hours as needed for anxiety. 30 tablet 0  . Multiple Vitamins-Minerals (THERA-M) TABS Take 1 tablet by mouth daily.     Marland Kitchen. omeprazole (PRILOSEC) 40 MG capsule Take 1 capsule (40 mg total) by mouth daily. 90 capsule 0  . sucralfate (CARAFATE) 1  g tablet Take 1 tablet (1 g total) by mouth 4 (four) times daily. 30 tablet 0   Current Facility-Administered Medications on File Prior to Visit  Medication Dose Route Frequency Provider Last Rate Last Dose  . triamcinolone acetonide (KENALOG) 10 MG/ML injection 10 mg  10 mg Other Once Asencion Islam, DPM        Allergies  Allergen Reactions  . Esomeprazole Magnesium Other (See Comments)     Unknown reaction   . Cabbage   . Mustard Seed   . Onion   . Other     Green peppers  . Tomato     Social History   Socioeconomic History  . Marital status: Single    Spouse name: Not on file  . Number of children: 0  . Years of education: Not on file  . Highest education level: Not on file  Social Needs  . Financial resource strain: Not on file  . Food insecurity - worry: Not on file  . Food insecurity - inability: Not on file  . Transportation needs - medical: Not on file  . Transportation needs - non-medical: Not on file  Occupational History  . Occupation: Billing/Insurance    Comment: Nutritional therapist  Tobacco Use  . Smoking status: Former Smoker    Last attempt to quit: 2014    Years since quitting: 5.1  . Smokeless tobacco: Never Used  . Tobacco comment: used to smoke socially  Substance and Sexual Activity  . Alcohol use: Yes    Alcohol/week: 0.0 oz    Comment: once a year  . Drug use: No  . Sexual activity: Not Currently    Birth control/protection: Implant  Other Topics Concern  . Not on file  Social History Narrative  . Not on file      Objective:  BP 112/66 (BP Location: Left Arm, Patient Position: Sitting, Cuff Size: Normal)   Pulse 93   Temp 98.4 F (36.9 C) (Oral)   Resp 18   Ht 5' 5.87" (1.673 m)   Wt 165 lb 6.4 oz (75 kg)   SpO2 98%   BMI 26.80 kg/m    Physical Exam  Constitutional: She is oriented to person, place, and time and well-developed, well-nourished, and in no distress.  HENT:  Head: Normocephalic and atraumatic.  Eyes: Conjunctivae are normal.  Neck: Normal range of motion.  Cardiovascular: Normal rate, regular rhythm and normal heart sounds.  Pulmonary/Chest: Effort normal and breath sounds normal. She has no wheezes. She has no rhonchi. She has no rales. She exhibits tenderness (with palpation of anterior chest wall).  Musculoskeletal:       Right lower leg: She exhibits no tenderness and no swelling.       Left lower leg: She exhibits no tenderness and no swelling.  Neurological: She is alert and oriented to  person, place, and time. Gait normal.  Skin: Skin is warm and dry.  Psychiatric: Affect normal.  Vitals reviewed.  Retrosternal chest pain completely resolved after GI cocktail administered.  Wells Criteria for Pulmonary Embolism Clinical signs and symptoms of DVT: No (0 points) PE is #1 diagnosis, or equally likely: No (0 points) Heart rate over 100: No (0 points) Immobilization at least 3 days, or surgery in past 4 weeks: No (0 points) Previous, objectively diagnosed PE or DVT: Yes (1.5 points) Hemoptysis: No (0 points) Malignancy w/ treatment within 6 mo, or palliative: No (0 points)  1.5 Points. Low risk group: 1.3% chance PE Anner Crete PS).  Or PE unlikely, 3% incidence Sheppard Penton [facility]).   Assessment and Plan :  This case was precepted with Dr. Creta Levin. 1. Pill esophagitis Hx and physical exam findings consistent with pill esophagitis. No red flag symptoms noted. It is reassuring that pain resolved completely after GI cocktail.  Do not suspect pulmonary embolism.  She is overall well-appearing, no distress. Vitals stable.  She is not tachycardic or tachypneic.  SPO2 98%.  No lower extremity swelling, tenderness, erythema, or warmth noted on exam.  Lungs are CTAB.  Wells criteria score of 1.5.  Symptoms completely resolved after GI cocktail. Patient reassured.  Strongly encouraged her to follow-up with GI on 01/15/18 as she will likely benefit from endoscopy.  Given Rx for Zantac and Magic mouthwash to swallow.  Educated that if any of your symptoms worsen or she develops any concerning symptoms discussed in office to please seek care immediately. - gi cocktail (Maalox,Lidocaine,Donnatal) - ranitidine (ZANTAC) 150 MG tablet; Take 1 tablet (150 mg total) by mouth 2 (two) times daily.  Dispense: 60 tablet; Refill: 0 - magic mouthwash SOLN; Take 5 mLs by mouth 3 (three) times daily.  Dispense: 300 mL; Refill: 0  2. Retrosternal chest pain 3. Anxiety Patient declines any further treatment for  anxiety. Follow-up as needed.    Benjiman Core PA-C  Primary Care at [facility] 01/06/2018 12:14 PM

## 2018-01-06 NOTE — Patient Instructions (Addendum)
Please continue with Prilosec.  Start Zantac twice daily.  I also given you a mouthwash to use up to 3 times a day.  If your insurance does not cover this, please contact our office.  Follow-up with GI on the 20th as planned.  If you can get in sooner, please go to that appointment.  If any of your symptoms worsen or you develop new concerning symptoms, please contact our office.Use back up cotnraceptive for 1 week.    Esophagitis Esophagitis is inflammation of the esophagus. The esophagus is the tube that carries food and liquids from your mouth to your stomach. Esophagitis can cause soreness or pain in the esophagus. This condition can make it difficult and painful to swallow. What are the causes? Most causes of esophagitis are not serious. Common causes of this condition include:  Gastroesophageal reflux disease (GERD). This is when stomach contents move back up into the esophagus (reflux).  Repeated vomiting.  An allergic-type reaction, especially caused by food allergies (eosinophilic esophagitis).  Injury to the esophagus by swallowing large pills with or without water, or swallowing certain types of medicines.  Swallowing (ingesting) harmful chemicals, such as household cleaning products.  Heavy alcohol use.  An infection of the esophagus.This most often occurs in people who have a weakened immune system.  Radiation or chemotherapy treatment for cancer.  Certain diseases such as sarcoidosis, Crohn disease, and scleroderma.  What are the signs or symptoms? Symptoms of this condition include:  Difficult or painful swallowing.  Pain with swallowing acidic liquids, such as citrus juices.  Pain with burping.  Chest pain.  Difficulty breathing.  Nausea.  Vomiting.  Pain in the abdomen.  Weight loss.  Ulcers in the mouth.  Patches of white material in the mouth (candidiasis).  Fever.  Coughing up blood or vomiting blood.  Stool that is black, tarry, or bright  red.  How is this diagnosed? Your health care provider will take a medical history and perform a physical exam. You may also have other tests, including:  An endoscopy to examine your stomach and esophagus with a small camera.  A test that measures the acidity level in your esophagus.  A test that measures how much pressure is on your esophagus.  A barium swallow or modified barium swallow to show the shape, size, and functioning of your esophagus.  Allergy tests.  How is this treated? Treatment for this condition depends on the cause of your esophagitis. In some cases, steroids or other medicines may be given to help relieve your symptoms or to treat the underlying cause of your condition. You may have to make some lifestyle changes, such as:  Avoiding alcohol.  Quitting smoking.  Changing your diet.  Exercising.  Changing your sleep habits and your sleep environment.  Follow these instructions at home: Take these actions to decrease your discomfort and to help avoid complications. Diet  Follow a diet as recommended by your health care provider. This may involve avoiding foods and drinks such as: ? Coffee and tea (with or without caffeine). ? Drinks that contain alcohol. ? Energy drinks and sports drinks. ? Carbonated drinks or sodas. ? Chocolate and cocoa. ? Peppermint and mint flavorings. ? Garlic and onions. ? Horseradish. ? Spicy and acidic foods, including peppers, chili powder, curry powder, vinegar, hot sauces, and barbecue sauce. ? Citrus fruit juices and citrus fruits, such as oranges, lemons, and limes. ? Tomato-based foods, such as red sauce, chili, salsa, and pizza with red sauce. ? [place]  and fatty foods, such as donuts, french fries, potato chips, and high-fat dressings. ? High-fat meats, such as hot dogs and fatty cuts of red and white meats, such as rib eye steak, sausage, ham, and bacon. ? High-fat dairy items, such as whole milk, butter, and cream  cheese.  Eat small, frequent meals instead of large meals.  Avoid drinking large amounts of liquid with your meals.  Avoid eating meals during the 2-3 hours before bedtime.  Avoid lying down right after you eat.  Do not exercise right after you eat.  Avoid foods and drinks that seem to make your symptoms worse. General instructions  Pay attention to any changes in your symptoms.  Take over-the-counter and prescription medicines only as told by your health care provider. Do not take aspirin, ibuprofen, or other NSAIDs unless your health care provider told you to do so.  If you have trouble taking pills, use a pill splitter to decrease the size of the pill. This will decrease the chance of the pill getting stuck or injuring your esophagus on the way down. Also, drink water after you take a pill.  Do not use any tobacco products, including cigarettes, chewing tobacco, and e-cigarettes. If you need help quitting, ask your health care provider.  Wear loose-fitting clothing. Do not wear anything tight around your waist that causes pressure on your abdomen.  Raise (elevate) the head of your bed about 6 inches (15 cm).  Try to reduce your stress, such as with yoga or meditation. If you need help reducing stress, ask your health care provider.  If you are overweight, reduce your weight to an amount that is healthy for you. Ask your health care provider for guidance about a safe weight loss goal.  Keep all follow-up visits as told by your health care provider. This is important. Contact a health care provider if:  You have new symptoms.  You have unexplained weight loss.  You have difficulty swallowing, or it hurts to swallow.  You have wheezing or a persistent cough.  Your symptoms do not improve with treatment.  You have frequent heartburn for more than two weeks. Get help right away if:  You have severe pain in your arms, neck, jaw, teeth, or back.  You feel sweaty, dizzy,  or light-headed.  You have chest pain or shortness of breath.  You vomit and your vomit looks like blood or coffee grounds.  Your stool is bloody or black.  You have a fever.  You cannot swallow, drink, or eat. This information is not intended to replace advice given to you by your health care provider. Make sure you discuss any questions you have with your health care provider. Document Released: 12/20/2004 Document Revised: 04/19/2016 Document Reviewed: 03/09/2015 Elsevier Interactive Patient Education  2018 [employer]   IF you received an x-ray today, you will receive an invoice from [facility]. Please contact [facility] at [phone] with questions or concerns regarding your invoice.   IF you received labwork today, you will receive an invoice from [facility]. Please contact [facility] at [phone] with questions or concerns regarding your invoice.   Our billing staff will not be able to assist you with questions regarding bills from these companies.  You will be contacted with the lab results as soon as they are available. The fastest way to get your results is to activate your My Chart account. Instructions are located on the last page of this paperwork. If you have not heard from us  regarding the results in 2 weeks, please contact this office.

## 2018-01-15 ENCOUNTER — Encounter: Payer: Self-pay | Admitting: Physician Assistant

## 2018-01-15 ENCOUNTER — Ambulatory Visit: Payer: BLUE CROSS/BLUE SHIELD | Admitting: Physician Assistant

## 2018-01-15 VITALS — BP 106/78 | HR 100 | Ht 65.86 in | Wt 162.2 lb

## 2018-01-15 DIAGNOSIS — R1013 Epigastric pain: Secondary | ICD-10-CM | POA: Diagnosis not present

## 2018-01-15 DIAGNOSIS — K209 Esophagitis, unspecified without bleeding: Secondary | ICD-10-CM

## 2018-01-15 MED ORDER — OMEPRAZOLE 40 MG PO CPDR: 40.0000 mg | DELAYED_RELEASE_CAPSULE | Freq: Two times a day (BID) | ORAL | 3 refills | Status: DC

## 2018-01-15 MED ORDER — AMBULATORY NON FORMULARY MEDICATION: 1 refills | Status: DC

## 2018-01-15 NOTE — Patient Instructions (Addendum)
If you are age 32 or older, your body mass index should be between 23-30. Your Body mass index is 26.3 kg/m. If this is out of the aforementioned range listed, please consider follow up with your Primary Care Provider.  If you are age 32 or younger, your body mass index should be between 19-25. Your Body mass index is 26.3 kg/m. If this is out of the aformentioned range listed, please consider follow up with your Primary Care Provider.   We have sent the following medications to your pharmacy for you to pick up at your convenience: Omeprazole and Gi cocktail.  Omeprazole 40mg  twice a day for 1 month, then 1 a day for 1 month, then 1 every other day, then stop.  Call in 2 weeks with an update. Ask for [facility] [phone]. And schedule a follow up with Dr Lavon PaganiniNandigam at that time.  Thank you for choosing [facility]

## 2018-01-15 NOTE — Progress Notes (Signed)
Subjective:    Patient ID: Stacy LollingJessica A Wilkins, female    DOB: 1986-05-10, 32 y.o.   MRN: [id number]  HPI Stacy Wilkins is a pleasant 32 year old white female, new to GI today, referred by Hal MoralesBrittney Wiseman PA-C. Patient had previously been seen by Dr. Le[facility], and was last evaluated there in follow-up 2017. Patient says her current symptoms started about 3 weeks ago while she was on a course of doxycycline for a skin infection. She says she was about 3 days into the course of doxycycline and felt as if one of the pills became stuck in her esophagus. She initially felt discomfort in her upper esophagus and then within a day or 2 started having pain lower in the substernal area which has persisted. She stopped the antibiotic. Interestingly she has not had any dysphagia or odynophagia but otherwise has had fairly constant discomfort in the mid chest aggravated sometimes by certain positions. Appetite has been okay, is been no nausea or vomiting. She has been on omeprazole 40 mg by mouth daily over the past couple of years because she was diagnosed with gastric ulcers on EGD in follow-up 2017 by her report. At that time she was taking BC powders on a regular basis for headaches. After her current symptoms started she was given Zantac twice daily by her PCP and also given a short course of a GI cocktail containing Benadryl Mylanta and Carafate. He says this helped her more than anything in symptoms have basically resolved by last weekend. She was feeling much better and mineral her this week started having discomfort again which is not as bad. Other medical problems include anxiety, and history of DVT and PE after a foot surgery.  Review of Systems Pertinent positive and negative review of systems were noted in the above HPI section.  All other review of systems was otherwise negative.  Outpatient Encounter Medications as of 01/15/2018  Medication Sig  . cetirizine (ZYRTEC) 10 MG tablet Take 1  tablet (10 mg total) by mouth daily.  Marland Kitchen. etonogestrel (NEXPLANON) 68 MG IMPL implant 1 each by Subdermal route once.  . fluticasone (FLONASE) 50 MCG/ACT nasal spray SPRAY TWICE INTO EACH NOSTRIL EVERY DAY  . hydrOXYzine (ATARAX/VISTARIL) 25 MG tablet Take 0.5-1 tablets (12.5-25 mg total) by mouth every 8 (eight) hours as needed for anxiety.  . magic mouthwash SOLN Take 5 mLs by mouth 3 (three) times daily.  . Multiple Vitamins-Minerals (THERA-M) TABS Take 1 tablet by mouth daily.   . ranitidine (ZANTAC) 150 MG tablet Take 1 tablet (150 mg total) by mouth 2 (two) times daily.  . [DISCONTINUED] omeprazole (PRILOSEC) 40 MG capsule Take 1 capsule (40 mg total) by mouth daily.  . AMBULATORY NON FORMULARY MEDICATION GI cocktail  90ml viscous lidocaine 2% 90ml dicyclomine 270ml maalox 5cc every 4- 6 hours  . omeprazole (PRILOSEC) 40 MG capsule Take 1 capsule (40 mg total) by mouth 2 (two) times daily.  . [DISCONTINUED] sucralfate (CARAFATE) 1 g tablet Take 1 tablet (1 g total) by mouth 4 (four) times daily.   Facility-Administered Encounter Medications as of 01/15/2018  Medication  . triamcinolone acetonide (KENALOG) 10 MG/ML injection 10 mg   Allergies  Allergen Reactions  . Esomeprazole Magnesium Other (See Comments)     Unknown reaction   . Cabbage   . Mustard Seed   . Onion   . Other     Green peppers  . Tomato    Patient Active Problem List   Diagnosis Date  Noted  . Anxiety 09/16/2017  . History of DVT (deep vein thrombosis) 03/21/2017  . History of pulmonary embolism 03/21/2017  . Food allergy 11/08/2016  . Palpitations 02/27/2016  . Hemorrhoids 09/29/2015   Social History   Socioeconomic History  . Marital status: Single    Spouse name: Not on file  . Number of children: 0  . Years of education: Not on file  . Highest education level: Not on file  Social Needs  . Financial resource strain: Not on file  . Food insecurity - worry: Not on file  . Food insecurity -  inability: Not on file  . Transportation needs - medical: Not on file  . Transportation needs - non-medical: Not on file  Occupational History  . Occupation: Billing/Insurance    Comment: Nutritional therapist  Tobacco Use  . Smoking status: Former Smoker    Last attempt to quit: 2014    Years since quitting: 5.1  . Smokeless tobacco: Never Used  . Tobacco comment: used to smoke socially  Substance and Sexual Activity  . Alcohol use: No    Alcohol/week: 0.0 oz    Frequency: Never  . Drug use: No  . Sexual activity: Not Currently    Birth control/protection: Implant  Other Topics Concern  . Not on file  Social History Narrative  . Not on file    [name]'s family history includes Diabetes in her maternal uncle; Hypertension in her father and mother; Prostate cancer in her brother and father.      Objective:    Vitals:   01/15/18 0834  BP: 106/78  Pulse: 100    Physical Exam ; well-developed young white female in no acute distress, pleasant blood pressure 106/78 pulse 100, height 5 foot 5, weight 162, BMI 26.3. HEENT; nontraumatic normocephalic EOMI PERRLA sclera anicteric, Cardiovascular ;regular rate and rhythm with S1-S2 no murmur rub or gallop, Pulmonary ;clear bilaterally, No chest wall tenderness, Abdomen ;soft, nontender nondistended bowel sounds are active there is no palpable mass or hepatosplenomegaly, Rectal; exam not done, Ext; no clubbing cyanosis or edema skin warm and dry, Neuropsych ;mood and affect appropriate       Assessment & Plan:   #45 32 year old white female with 3 week history of mid substernal chest pain, onset while taking a course of doxycycline and feeling as if one of the pills had become lodged. She has not had odynophagia but has had good response to GI cocktail, and picture is most consistent with an acute pill-induced esophagitis or possible pill induced ulcer #2 history of gastric ulcers aspirin-induced 2017 #3 anxiety #4 previous  history of DVT/PE postoperatively  Plan; Will increase omeprazole to 40 mg by mouth twice a day 1 month, then back to once daily 1 month then every other day 1 month then discontinue. Patient would prefer to not stay on a chronic PPI Refill GI cocktail 5 mL 4 times daily over the next 2 weeks then as needed We discussed potential EGD however at this point I don't think would change our management. We will treat for the next few weeks and she is asked to call back if she has not had resolution of her symptoms in another 2 weeks. At that time would schedule for EGD with Dr. Lavon Paganini. Patient signed a release and will obtain copies of her prior EGD in path.  Amy S Esterwood PA-C 01/15/2018   Cc: Benjiman Core D, PA*

## 2018-01-15 NOTE — Progress Notes (Signed)
Patient may have had pill esophagitis, will consider EGD if symptoms don't resolve in next 2 weeks with high dose PPI.  Reviewed and agree with documentation and assessment and plan. Iona BeardK. Veena Nandigam , MD

## 2018-01-16 ENCOUNTER — Telehealth: Payer: Self-pay | Admitting: Physician Assistant

## 2018-01-16 NOTE — Telephone Encounter (Signed)
Copied from CRM [phone]. Topic: Quick Communication - See Telephone Encounter >> Jan 16, 2018 12:58 PM Waymon AmatoBurton, Donna F wrote: CRM for notification. See Telephone encounter for:  Pt is wanting to talk with [place] about the gi referral and what was said at the visit -GI provider states if GI cocktail does not work it isnt esophagus and the problem is still the same   Best number [phone] 01/16/18.

## 2018-01-16 NOTE — Telephone Encounter (Signed)
The patient says she is using the GI cocktail every 4 hours and it is not helping. She says her symptoms are the same, unchanged and no worse. She is concerned "it is something other than what we think it is."

## 2018-01-20 ENCOUNTER — Other Ambulatory Visit: Payer: Self-pay

## 2018-01-20 ENCOUNTER — Encounter: Payer: Self-pay | Admitting: Physician Assistant

## 2018-01-20 ENCOUNTER — Telehealth: Payer: Self-pay

## 2018-01-20 ENCOUNTER — Ambulatory Visit (INDEPENDENT_AMBULATORY_CARE_PROVIDER_SITE_OTHER): Payer: BLUE CROSS/BLUE SHIELD | Admitting: Physician Assistant

## 2018-01-20 ENCOUNTER — Ambulatory Visit (INDEPENDENT_AMBULATORY_CARE_PROVIDER_SITE_OTHER): Payer: BLUE CROSS/BLUE SHIELD

## 2018-01-20 VITALS — BP 110/78 | HR 83 | Temp 99.1°F | Resp 18 | Ht 66.34 in | Wt 161.8 lb

## 2018-01-20 DIAGNOSIS — F419 Anxiety disorder, unspecified: Secondary | ICD-10-CM

## 2018-01-20 DIAGNOSIS — Z01419 Encounter for gynecological examination (general) (routine) without abnormal findings: Secondary | ICD-10-CM | POA: Diagnosis not present

## 2018-01-20 DIAGNOSIS — J309 Allergic rhinitis, unspecified: Secondary | ICD-10-CM | POA: Diagnosis not present

## 2018-01-20 DIAGNOSIS — K208 Other esophagitis: Secondary | ICD-10-CM

## 2018-01-20 DIAGNOSIS — Z1151 Encounter for screening for human papillomavirus (HPV): Secondary | ICD-10-CM | POA: Diagnosis not present

## 2018-01-20 DIAGNOSIS — R1310 Dysphagia, unspecified: Secondary | ICD-10-CM

## 2018-01-20 DIAGNOSIS — R072 Precordial pain: Secondary | ICD-10-CM | POA: Diagnosis not present

## 2018-01-20 DIAGNOSIS — R1319 Other dysphagia: Secondary | ICD-10-CM

## 2018-01-20 DIAGNOSIS — T50905A Adverse effect of unspecified drugs, medicaments and biological substances, initial encounter: Secondary | ICD-10-CM

## 2018-01-20 DIAGNOSIS — Z6827 Body mass index (BMI) 27.0-27.9, adult: Secondary | ICD-10-CM | POA: Diagnosis not present

## 2018-01-20 DIAGNOSIS — R079 Chest pain, unspecified: Secondary | ICD-10-CM | POA: Diagnosis not present

## 2018-01-20 DIAGNOSIS — M9901 Segmental and somatic dysfunction of cervical region: Secondary | ICD-10-CM | POA: Diagnosis not present

## 2018-01-20 DIAGNOSIS — M9903 Segmental and somatic dysfunction of lumbar region: Secondary | ICD-10-CM | POA: Diagnosis not present

## 2018-01-20 DIAGNOSIS — M9902 Segmental and somatic dysfunction of thoracic region: Secondary | ICD-10-CM | POA: Diagnosis not present

## 2018-01-20 DIAGNOSIS — M9904 Segmental and somatic dysfunction of sacral region: Secondary | ICD-10-CM | POA: Diagnosis not present

## 2018-01-20 MED ORDER — MAGIC MOUTHWASH: 5.0000 mL | Freq: Three times a day (TID) | ORAL | 1 refills | Status: DC

## 2018-01-20 MED ORDER — ESCITALOPRAM OXALATE 5 MG PO TABS: 5.0000 mg | ORAL_TABLET | Freq: Every day | ORAL | 0 refills | Status: DC

## 2018-01-20 MED ORDER — SUCRALFATE 1 GM/10ML PO SUSP: 1.0000 g | Freq: Four times a day (QID) | ORAL | 1 refills | Status: DC

## 2018-01-20 NOTE — Telephone Encounter (Signed)
Patient was aware you were out that day. She agrees to this plan and opts to have the EGD and try the Carafate.  Patient in for instructions. Consent form signed.

## 2018-01-20 NOTE — Telephone Encounter (Signed)
Please let pt know I was off on Friday so did not see message until now - I see she has gone back to her PCP today with same sxs - refilling GI cocktail-, adding anxiety med- getting CXR. I would have her stay on BID PPI as we discussed .  WE can schedule her for EGD with DR Lavon PaganiniNandigam if she wants to proceed with that  And /or add carafate  Liquid  1 gm 4 times daily between meals and bedtime

## 2018-01-20 NOTE — Telephone Encounter (Signed)
Please advise 

## 2018-01-20 NOTE — Progress Notes (Signed)
Stacy Wilkins  MRN: [id number] DOB: November 25, 1986  Subjective:  Stacy Wilkins is a 32 y.o. female seen in office today for a chief complaint of follow-up on retrosternal pain after a tablet of doxycycline getting stuck in her throat ~4-5 weeks ago. Pt has followed up here multiple times for this issue. Please see previous OV notes for additional details. Since last visit, she has seen GI on 01/15/18 and they agreed that this presentation was most consistent with acute pill induced esophagitis or possible pill induced ulcer. They recommended she increase omeprazole to 40mg  BID x 1 month, then once daily x 1 month, then every other day x 1 month. Also given Rx for GI cocktail, different from the one I initially prescribed. Discussed doing an EGD if sx persisted for an additional 2 weeks. She returns here because she states she is still having the pain despite the increase in PPI and GI cocktail and is very concerned it is something else. Pain is worse when she leans forward. Denies hematemesis, abdominal pain, nausea, vomiting, odonyphagia, and dyshagia.  Patient is worried because she had a provoked DVT and PE from bunionectomy surgery in 02/2017 and anytime she has chest discomfort is concerned that it is going to be a blood clot.  She denies shortness of breath, dyspnea on exertion, heart palpitations, lower leg swelling, calf tenderness, hemoptysis, and exertional chest pain.  Denies recent travel, hospitalizations, or mobilizations.  Denies smoking.   At her last OV here, in house GI cocktail completely resolved patient's sx. She was given Rx of magic mouthwash with benedryl, mylanta, and sucralfate which also helped but notes the sucralfate only suspension given by GI did not help. She also tried benedryl and decongestant orally the other night and that resolved her sx.  She does have associated nasal congestin, sinus pressure, and ear pain. Takes zyrtec daily for allergies.   Anxiety: Has  longstanding hx of anxiety.  Feels like her anxiety is not well controlled at this time. Keeps worrying about the smallest things. She is taking hydroxyzine nightly but does not feel as if it is working as well as it used to. She tried both SSRI and benzos in the past. Notes SSRIs made her feel emotionless and did not like the way xanax made her feel. She has previously been resistant to trying another SSRI but is agreeable to do so now. Does not want to do counseling. Denies dysphoric mood, hallucinations, suicidal or homicidal thoughts.   Review of Systems Per HPI Patient Active Problem List   Diagnosis Date Noted  . Anxiety 09/16/2017  . History of DVT (deep vein thrombosis) 03/21/2017  . History of pulmonary embolism 03/21/2017  . Food allergy 11/08/2016  . Palpitations 02/27/2016  . Hemorrhoids 09/29/2015    Current Outpatient Medications on File Prior to Visit  Medication Sig Dispense Refill  . AMBULATORY NON FORMULARY MEDICATION GI cocktail  90ml viscous lidocaine 2% 90ml dicyclomine maalox 5cc every 4- 6 hours 450 mL 1  . cetirizine (ZYRTEC) 10 MG tablet Take 1 tablet (10 mg total) by mouth daily. 30 tablet 11  . etonogestrel (NEXPLANON) 68 MG IMPL implant 1 each by Subdermal route once.    . fluticasone (FLONASE) 50 MCG/ACT nasal spray SPRAY TWICE INTO EACH NOSTRIL EVERY DAY 16 g 2  . hydrOXYzine (ATARAX/VISTARIL) 25 MG tablet Take 0.5-1 tablets (12.5-25 mg total) by mouth every 8 (eight) hours as needed for anxiety. 30 tablet 0  . Multiple Vitamins-Minerals (THERA-M)  TABS Take 1 tablet by mouth daily.     Marland Kitchen omeprazole (PRILOSEC) 40 MG capsule Take 1 capsule (40 mg total) by mouth 2 (two) times daily. 60 capsule 3  . ranitidine (ZANTAC) 150 MG tablet Take 1 tablet (150 mg total) by mouth 2 (two) times daily. 60 tablet 0  . magic mouthwash SOLN Take 5 mLs by mouth 3 (three) times daily. (Patient not taking: Reported on 01/20/2018) 300 mL 0   Current Facility-Administered  Medications on File Prior to Visit  Medication Dose Route Frequency Provider Last Rate Last Dose  . triamcinolone acetonide (KENALOG) 10 MG/ML injection 10 mg  10 mg Other Once Asencion Islam, DPM        Allergies  Allergen Reactions  . Esomeprazole Magnesium Other (See Comments)     Unknown reaction   . Cabbage   . Mustard Seed   . Onion   . Other     Green peppers  . Tomato      Objective:  BP 110/78 (BP Location: Left Arm, Patient Position: Sitting, Cuff Size: Normal)   Pulse (!) 104   Temp 99.1 F (37.3 C) (Oral)   Resp 18   Ht 5' 6.34" (1.685 m)   Wt 161 lb 12.8 oz (73.4 kg)   LMP 01/15/2018 (Approximate)   SpO2 98%   BMI 25.85 kg/m   Physical Exam  Constitutional: She is oriented to person, place, and time and well-developed, well-nourished, and in no distress.  HENT:  Head: Normocephalic and atraumatic.  Right Ear: Tympanic membrane is not erythematous and not bulging. No middle ear effusion.  Left Ear: Tympanic membrane is not erythematous and not bulging. A middle ear effusion is present.  Nose: Mucosal edema (mild in right, moderate in left) present. Right sinus exhibits no maxillary sinus tenderness and no frontal sinus tenderness. Left sinus exhibits no maxillary sinus tenderness and no frontal sinus tenderness.  Mouth/Throat: Uvula is midline, oropharynx is clear and moist and mucous membranes are normal.  Eyes: Conjunctivae are normal.  Neck: Normal range of motion.  Cardiovascular: Normal rate, regular rhythm and normal heart sounds.  Pulmonary/Chest: Effort normal. She has no wheezes. She has no rhonchi. She has no rales. She exhibits tenderness (with palpation of sternum).  Neurological: She is alert and oriented to person, place, and time. Gait normal.  Skin: Skin is warm and dry.  Psychiatric: Affect normal.  Vitals reviewed.     GAD 7 : Generalized Anxiety Score 01/20/2018 09/16/2017 08/14/2017 01/03/2017  Nervous, Anxious, on Edge 1 1 1 2     Control/stop worrying 2 2 1 3   Worry too much - different things 1 2 0 3  Trouble relaxing 0 0 1 3  Restless 0 0 0 0  Easily annoyed or irritable 0 0 0 0  Afraid - awful might happen 2 1 1  0  Total GAD 7 Score 6 6 4 11   Anxiety Difficulty Not difficult at all Not difficult at all Not difficult at all Not difficult at all     Dg Chest 2 View  Result Date: 01/20/2018 CLINICAL DATA:  retrosternal chest pain x 1 month. EXAM: CHEST  2 VIEW COMPARISON:  Chest x-ray dated 05/27/2017. FINDINGS: Heart size and mediastinal contours are normal. Lungs are clear. No pleural effusion or pneumothorax seen. Osseous structures about the chest are unremarkable, stable mild scoliosis of the thoracolumbar spine. Sternum appears unremarkable. IMPRESSION: 1. No active cardiopulmonary disease. No evidence of pneumonia or pulmonary edema. Heart size is normal.  2. Stable mild scoliosis of the thoracolumbar spine. No acute or suspicious osseous finding. Electronically Signed   By: Bary RichardStan  Maynard M.D.   On: 01/20/2018 12:58    Assessment and Plan :  1. Retrosternal chest pain CXR with no acute bony or cardiopulmonary abnormality. Pt reassured. She is overall well appearing, no distress. Vitals stable. I believe there are multiple factors playing a role in pt's sx. Pill esophagitis is still high on differential. Encouraged pt to continue with medication regimen implemented by GI and to follow up with them in 1.5 weeks if symptoms still persisting. It is interesting that pt's sx resolve with benedryl and decongestants. Wondering if untreated allergies could be playing a role? Recommend she take OTC zyrtec, flonase, and sudafed daily for the next 5 days with benedryl at night. If it resolves pain, continue with daily zyrtec and flonase. If no improvement after 5 days, I have given refill for magic mouthwash.  - DG Chest 2 View; Future 2. Pill esophagitis - magic mouthwash SOLN; Take 5 mLs by mouth 3 (three) times daily.   Dispense: 300 mL; Refill: 1  3. Anxiety Uncontrolled at this time. Recommended she d/c hydroxyzine and start low dose daily SSRI at this time. Educated on potential side effects of starting SSRI. Also given contact info for local therapists. Plan to follow up in office in 2 weeks or sooner if sx worsen.  - escitalopram (LEXAPRO) 5 MG tablet; Take 1 tablet (5 mg total) by mouth daily.  Dispense: 90 tablet; Refill: 0  4. Allergic rhinitis, unspecified seasonality, unspecified trigger   Benjiman CoreBrittany Wiseman PA-C  [facility]  [facility] 01/20/2018 12:26 PM

## 2018-01-20 NOTE — Telephone Encounter (Signed)
Seen today in office for this issue.

## 2018-01-20 NOTE — Patient Instructions (Addendum)
For uncontrolled allergies, I recommend daily over the counter Zyrtec, Flonase, and Sudafed.  You may use additional Benadryl at nighttime.  Do not use hydroxyzine.  I recommend trying this in the next 5 days to see if it helps.  If it resolves your chest pain, continue with Zyrtec and Flonase daily.  Discontinue Sudafed and Benadryl at that time.  If no improvement with this regimen after 5 days, I have given you a refill for the GI cocktail I originally prescribed.  For  anxiety, I recommend we start a daily SSRI and weekly/monthly therapy. The SSRI I am prescribing is called lexapro. Please keep in mind that when you start an SSRI it can worsen your  anxiety symptoms. It can also increase risk of suicidal thoughts so if this occurs, please seek care immediately. Common side effects of SSRIs include, but are not limited to, GI upset, nausea, vomiting, insomnia, and drowsiness. Typically these side effects will resolve after 2 weeks. Please keep in mind that it can take up to 4-6 weeks for SSRIs to be fully effective. Below is some information for local therapists. I recommend you reach out to one of these therapists before our next visit.Plan to return to clinic in 4 weeks for reevaluation of symptoms.   For therapy -- [facility] ([address]Beth Coralie Common[place]incaid, Ernest McCoy, Heather Joycelyn Schmid[place]chens, Karla [place]) - [phone] [facility]([place]) - [phone] [facility] - [phone] [facility] - [phone] [place] Irish[place] Mylan - [phone] [facility]  - [phone] (do not file insurance)        IF you received an x-ray today, you will receive an invoice from [facility]. Please contact [facility] at [phone] with questions or concerns regarding your invoice.   IF you received labwork today, you will receive an invoice from [facility]. Please contact [facility] at [phone] with  questions or concerns regarding your invoice.   Our billing staff will not be able to assist you with questions regarding bills from these companies.  You will be contacted with the lab results as soon as they are available. The fastest way to get your results is to activate your My Chart account. Instructions are located on the last page of this paperwork. If you have not heard from us regarding the results in 2 weeks, please contact this office.

## 2018-01-24 ENCOUNTER — Encounter: Payer: Self-pay | Admitting: Physician Assistant

## 2018-01-27 ENCOUNTER — Encounter: Payer: Self-pay | Admitting: Physician Assistant

## 2018-01-29 ENCOUNTER — Telehealth: Payer: Self-pay | Admitting: Physician Assistant

## 2018-01-29 NOTE — Telephone Encounter (Signed)
Patient says "ever since I took my Omeprazole last night I have felt worse." Asked to give more specifics. She says she feels like she has a lump in her throat. She has a pain that runs across her chest. Pain is sharp if she coughs. Denies nausea, vomiting, reflux, recent exercise or strenuous activity. No URI symptoms. No rash or facial swelling. She does not feel her food or PO liquids "sticking" when she eats.  She did not take Omeprazole this morning. She can reproduce the pain by coughing or pressing in the center of her chest. Please advise.

## 2018-01-30 ENCOUNTER — Encounter: Payer: Self-pay | Admitting: Physician Assistant

## 2018-01-30 NOTE — Telephone Encounter (Signed)
Called patient to ask if she was any better today. Left a message on her voicemail

## 2018-01-31 ENCOUNTER — Ambulatory Visit: Payer: Self-pay

## 2018-01-31 NOTE — Telephone Encounter (Signed)
fyi Whitney dgaddy

## 2018-01-31 NOTE — Telephone Encounter (Signed)
Pt. called to request to have a CT of her chest to look for a blood clot.  C/o having chest pain in between the breast bone and up to the throat.  Reported she has been dealing with this pain for about a month.  Reported she did have a GI evaluation, and was given medication with Lidocaine that did not improve symptoms; was told if it didn't improve her symptoms, then it likely was not related to an Esophagitis.  Reported since Wed., the discomfort in her chest has become more constant.  Stated "if I press on it, it hurts; if I bend over, or lift my arms above my head, or lift anything, it hurts."  Denied any shortness of breath, sweating, nausea/ vomiting, or radiation of pain.  Stated "there is no increase in pain with eating, and it actually feels better if I eat something."  Hx of PE; ques. if it feels similar to previous PE? Stated she did not have any pain, and didn't know about it, until she went for post op check.  Voiced frustration that this has been going on for a month.  Advised PCP is not in today, to discuss pt's symptoms and request for a CT Scan.  Appt. Sched. For 3/9 @ 1:40 PM.  Advised to go to the ER if symptoms worsen; ie: shortness of breath, worsening chest pain/ back pain. Verb. Understanding.       Message from Raquel Sarna sent at 01/31/2018 3:33 PM EST   Summary: Pulmonary Embolism Pain   Pt was diagnosed with pulmonary embolism. Chest has been hurting for over a month with no relief. Pt stated [place] ordered a CT scan w/ contrast at [facility] before. She is requesting if this can be done again for her.  Please call pt [phone]          Reason for Disposition . [1] Patient claims chest pain is same as previously diagnosed "heartburn" AND [2] describes burning in chest AND [3] accompanying sour taste in mouth    Does not report any heartburn or sour taste in mouth.  Reported the chest pain has been ongoing for one month, and more constant over past  2-3 days.  Answer Assessment - Initial Assessment Questions 1. LOCATION: "Where does it hurt?"       Between breasts and up to throat 2. RADIATION: "Does the pain go anywhere else?" (e.g., into neck, jaw, arms, back)     No 3. ONSET: "When did the chest pain begin?" (Minutes, hours or days)      About one month 4. PATTERN "Does the pain come and go, or has it been constant since it started?"  "Does it get worse with exertion?"      The pattern was intermittent; changed to constant since Wed. night 5. DURATION: "How long does it last" (e.g., seconds, minutes, hours)     Continuous 6. SEVERITY: "How bad is the pain?"  (e.g., Scale 1-10; mild, moderate, or severe)    - MILD (1-3): doesn't interfere with normal activities     - MODERATE (4-7): interferes with normal activities or awakens from sleep    - SEVERE (8-10): excruciating pain, unable to do any normal activities       5/10 with rest; 8/10 with activity 7. CARDIAC RISK FACTORS: "Do you have any history of heart problems or risk factors for heart disease?" (e.g., prior heart attack, angina; high blood pressure, diabetes, being overweight, high cholesterol, smoking, or  strong family history of heart disease)     Tachycardia for several years  8. PULMONARY RISK FACTORS: "Do you have any history of lung disease?"  (e.g., blood clots in lung, asthma, emphysema, birth control pills)     Hx of blood clot in lung just about one year 9. CAUSE: "What do you think is causing the chest pain?"     Blood clot in lung 10. OTHER SYMPTOMS: "Do you have any other symptoms?" (e.g., dizziness, nausea, vomiting, sweating, fever, difficulty breathing, cough)       Denied SOB; intermittent cough; no fever/ chills; no dizziness; no sweating; no nausea or vomiting.  11. PREGNANCY: "Is there any chance you are pregnant?" "When was your last menstrual period?"       No; LMP 2/23  Protocols used: CHEST PAIN-A-AH

## 2018-02-01 ENCOUNTER — Encounter (HOSPITAL_COMMUNITY): Payer: Self-pay | Admitting: Emergency Medicine

## 2018-02-01 ENCOUNTER — Emergency Department (HOSPITAL_COMMUNITY)
Admission: EM | Admit: 2018-02-01 | Discharge: 2018-02-01 | Disposition: A | Discharge status: Home / Self Care | Payer: BLUE CROSS/BLUE SHIELD | Attending: Emergency Medicine | Admitting: Emergency Medicine

## 2018-02-01 ENCOUNTER — Emergency Department (HOSPITAL_COMMUNITY): Payer: BLUE CROSS/BLUE SHIELD

## 2018-02-01 ENCOUNTER — Ambulatory Visit: Payer: Self-pay | Admitting: Physician Assistant

## 2018-02-01 DIAGNOSIS — Z87891 Personal history of nicotine dependence: Secondary | ICD-10-CM | POA: Diagnosis not present

## 2018-02-01 DIAGNOSIS — K2100 Gastro-esophageal reflux disease with esophagitis, without bleeding: Secondary | ICD-10-CM

## 2018-02-01 DIAGNOSIS — Z79899 Other long term (current) drug therapy: Secondary | ICD-10-CM | POA: Insufficient documentation

## 2018-02-01 DIAGNOSIS — Z86718 Personal history of other venous thrombosis and embolism: Secondary | ICD-10-CM | POA: Diagnosis not present

## 2018-02-01 DIAGNOSIS — R0789 Other chest pain: Secondary | ICD-10-CM | POA: Diagnosis not present

## 2018-02-01 DIAGNOSIS — R079 Chest pain, unspecified: Secondary | ICD-10-CM | POA: Diagnosis not present

## 2018-02-01 DIAGNOSIS — E785 Hyperlipidemia, unspecified: Secondary | ICD-10-CM | POA: Insufficient documentation

## 2018-02-01 LAB — BASIC METABOLIC PANEL
Anion gap: 11 (ref 5–15)
BUN: 11 mg/dL (ref 6–20)
CO2: 23 mmol/L (ref 22–32)
Calcium: 9.5 mg/dL (ref 8.9–10.3)
Chloride: 105 mmol/L (ref 101–111)
Creatinine, Ser: 0.84 mg/dL (ref 0.44–1.00)
GFR calc Af Amer: >60 mL/min (ref >60)
GFR calc non Af Amer: >60 mL/min (ref >60)
Glucose, Bld: 88 mg/dL (ref 65–99)
Potassium: 3.6 mmol/L (ref 3.5–5.1)
Sodium: 139 mmol/L (ref 135–145)

## 2018-02-01 LAB — CBC
HCT: 44.6 % (ref 36.0–46.0)
Hemoglobin: 15.2 g/dL — ABNORMAL HIGH (ref 12.0–15.0)
MCH: 29.4 pg (ref 26.0–34.0)
MCHC: 34.1 g/dL (ref 30.0–36.0)
MCV: 86.3 fL (ref 78.0–100.0)
Platelets: 190 10*3/uL (ref 150–400)
RBC: 5.17 MIL/uL — ABNORMAL HIGH (ref 3.87–5.11)
RDW: 12.2 % (ref 11.5–15.5)
WBC: 6.1 10*3/uL (ref 4.0–10.5)

## 2018-02-01 LAB — I-STAT TROPONIN, ED: Troponin i, poc: 0 ng/mL (ref 0.00–0.08)

## 2018-02-01 LAB — I-STAT BETA HCG BLOOD, ED (MC, WL, AP ONLY): I-stat hCG, quantitative: <5 m[IU]/mL (ref <5)

## 2018-02-01 LAB — D-DIMER, QUANTITATIVE: D-Dimer, Quant: 0.36 ug/mL-FEU (ref 0.00–0.50)

## 2018-02-01 NOTE — ED Triage Notes (Signed)
Patient reports that since Wed she had more constant chest pains. Reports that had dizziness today with chest pains. Hx PEs.

## 2018-02-01 NOTE — Discharge Instructions (Signed)
Keep your follow-up appointment with gastroenterology for your EGD on Tuesday.  You can take Mylanta or Pepto-Bismol as directed on the label until then. Sleeping with pillows under your head so that your torso is elevated to about 30 degrees may help to improve your pain. You can also try avoiding eating less than 3 hours before bedtime.   If you develop new or worsening symptoms including dark black, tarry stools, vomiting blood, a high fever with severe abdominal pain, new shortness of breath that is worse with activity, or other concerning symptoms, please return to the emergency department for re-evaluation.

## 2018-02-01 NOTE — ED Provider Notes (Signed)
[facility]-EMERGENCY DEPT Provider Note   CSN: [id number] Arrival date & time: 02/01/18  1024     History   Chief Complaint Chief Complaint  Patient presents with  . Chest Pain    HPI Stacy Wilkins is a 32 y.o. female with a h/o DVT, PE, gastric ulcer, GERD, HLD, and anxiety who presents to the emergency department with a chief complaint of chest pain.  The patient reports central CP that radiates upward and across the chest bilaterally that began one month ago as "tight", intermittent pain, but has become constant over the last week. She states that last night the pain significantly worsened and became "sharp" and cramp".  She states the current pain is "pressure-like" and "heavy". "It feels like someone is sitting on my chest."  Pain is worse with laying flat and leaning forward.  Alleviated by nothing.    She denies fever, chills, dyspnea, palpitations, cough, abdominal pain, leg pain or swelling, nausea, vomiting, headache, lightheadedness, or dizziness.  Denies melena, hematochezia, or hematemesis.  She reports that she has been evaluated several times over the last few weeks by GI on 02/20 for pill esophagitis after she a tablet of doxycycline got stuck in her throat about 5-6 weeks ago.  She was treated with omeprazole, Carafate,and a GI cocktail without improvement.  She reports that she has discussed doing an EGD if her symptoms do not improve.  Last EGD in 2017 with multiple gastric ulcers.  She is scheduled for a repeat EGD in 3 days.  She is concerned she has another PE. She was treated for a PE and DVT in 2018 after having ankle surgery.  She was on Eliquis for 3 months, but is currently is not anticoagulated.  No recent travel, surgery, or immobilization.  She has a progesterone implantable birth control. No family hx of PE or DVT.   The history is provided by the patient. No language interpreter was used.  Chest Pain   Pertinent negatives include no  abdominal pain, no back pain, no cough, no dizziness, no fever, no nausea, no palpitations, no shortness of breath and no vomiting.    Past Medical History:  Diagnosis Date  . Anxiety   . Depression   . DVT (deep venous thrombosis) (HCC)   . Generalized headaches   . GERD (gastroesophageal reflux disease)   . Hyperlipidemia   . Migraine   . Ulcer     Patient Active Problem List   Diagnosis Date Noted  . Anxiety 09/16/2017  . History of DVT (deep vein thrombosis) 03/21/2017  . History of pulmonary embolism 03/21/2017  . Food allergy 11/08/2016  . Palpitations 02/27/2016  . Hemorrhoids 09/29/2015    Past Surgical History:  Procedure Laterality Date  . bunionectomy     . EYE SURGERY    . TONSILLECTOMY AND ADENOIDECTOMY      OB History    No data available       Home Medications    Prior to Admission medications   Medication Sig Start Date End Date Taking? Authorizing Provider  etonogestrel (NEXPLANON) 68 MG IMPL implant 1 each by Subdermal route once.   Yes [provider]  fluticasone (FLONASE) 50 MCG/ACT nasal spray SPRAY TWICE INTO EACH NOSTRIL EVERY DAY 07/22/17  Yes Barnett Abu, [place] D, PA-C  hydrOXYzine (ATARAX/VISTARIL) 25 MG tablet Take 0.5-1 tablets (12.5-25 mg total) by mouth every 8 (eight) hours as needed for anxiety. 12/25/17  Yes Benjiman Core D, PA-C  Multiple Vitamins-Minerals (  THERA-M) TABS Take 1 tablet by mouth daily.    Yes [provider]  omeprazole (PRILOSEC) 40 MG capsule Take 1 capsule (40 mg total) by mouth 2 (two) times daily. 01/15/18   Esterwood, Amy S, PA-C    Family History Family History  Problem Relation Age of Onset  . Hypertension Mother   . Hypertension Father   . Prostate cancer Father   . Prostate cancer Brother   . Diabetes Maternal Uncle   . Colon cancer Neg Hx   . Liver cancer Neg Hx     Social History Social History   Tobacco Use  . Smoking status: Former Smoker    Last attempt to quit: 2014     Years since quitting: 5.1  . Smokeless tobacco: Never Used  . Tobacco comment: used to smoke socially  Substance Use Topics  . Alcohol use: No    Alcohol/week: 0.0 oz    Frequency: Never  . Drug use: No     Allergies   Esomeprazole magnesium; Cabbage; Mustard seed; Onion; Other; and Tomato   Review of Systems Review of Systems  Constitutional: Negative for activity change, chills and fever.  HENT: Negative for congestion.   Eyes: Negative for visual disturbance.  Respiratory: Positive for chest tightness. Negative for cough, shortness of breath and wheezing.   Cardiovascular: Positive for chest pain. Negative for palpitations and leg swelling.  Gastrointestinal: Negative for abdominal pain, diarrhea, nausea and vomiting.  Genitourinary: Negative for flank pain.  Musculoskeletal: Negative for back pain.  Skin: Negative for rash.  Allergic/Immunologic: Negative for immunocompromised state.  Neurological: Negative for dizziness and light-headedness.  Psychiatric/Behavioral: Negative for confusion.   Physical Exam Updated Vital Signs BP 118/88   Pulse 82   Temp 98 F (36.7 C) (Oral)   Resp 18   LMP 01/15/2018 (Approximate)   SpO2 100%   Physical Exam  Constitutional: No distress.  HENT:  Head: Normocephalic.  Eyes: Conjunctivae are normal. No scleral icterus.  Neck: Neck supple. No JVD present.  Cardiovascular: Regular rhythm and normal heart sounds. Tachycardia present. Exam reveals no gallop and no friction rub.  No murmur heard. Pulses:      Radial pulses are 2+ on the right side, and 2+ on the left side.       Dorsalis pedis pulses are 2+ on the right side, and 2+ on the left side.       Posterior tibial pulses are 2+ on the right side, and 2+ on the left side.  Pulmonary/Chest: Effort normal. No stridor. No respiratory distress. She has no wheezes. She has no rales. She exhibits no tenderness.  Abdominal: Soft. Bowel sounds are normal. She exhibits no  distension. There is no tenderness.  Musculoskeletal: Normal range of motion. She exhibits no edema, tenderness or deformity.  No edema of the bilateral lower extremities. NVI.  Neurological: She is alert.  Skin: Skin is warm. Capillary refill takes less than 2 seconds. No rash noted. She is not diaphoretic. No pallor.  Psychiatric: Her behavior is normal.  Appears anxious.  Nursing note and vitals reviewed.  ED Treatments / Results  Labs (all labs ordered are listed, but only abnormal results are displayed) Labs Reviewed  CBC - Abnormal; Notable for the following components:      Result Value   RBC 5.17 (*)    Hemoglobin 15.2 (*)    All other components within normal limits  BASIC METABOLIC PANEL  D-DIMER, QUANTITATIVE (NOT AT [facility])  I-STAT TROPONIN, ED  I-STAT BETA HCG BLOOD, ED ([facility], [facility], [facility] ONLY)    EKG  EKG Interpretation  Date/Time:  Saturday February 01 2018 12:35:14 EST Ventricular Rate:  81 PR Interval:    QRS Duration: 82 QT Interval:  362 QTC Calculation: 421 R Axis:   70 Text Interpretation:  Sinus arrhythmia Confirmed by Jacalyn LefevreHaviland, Julie [phone]([id number]) on 02/01/2018 12:38:59 PM       Radiology Dg Chest 2 View  Result Date: 02/01/2018 CLINICAL DATA:  Chest pain. EXAM: CHEST - 2 VIEW COMPARISON:  January 20, 2018 FINDINGS: The heart size and mediastinal contours are within normal limits. Both lungs are clear. The visualized skeletal structures are unremarkable. IMPRESSION: No active cardiopulmonary disease. Electronically Signed   By: Gerome Samavid  Williams III M.D   On: 02/01/2018 11:32    Procedures Procedures (including critical care time)  Medications Ordered in ED Medications - No data to display   Initial Impression / Assessment and Plan / ED Course  I have reviewed the triage vital signs and the nursing notes.  Pertinent labs & imaging results that were available during my care of the patient were reviewed by me and considered in my medical decision making (see  chart for details).     32 year old female with a h/o DVT, PE, gastric ulcer, GERD, HLD, and anxiety presenting with chest pain for 1 month, that began constant 1 week ago and worsened yesterday. She is concerned she had another PE.  After reviewing her medical record, it appears that her last DVT and PE were provoked by surgery.   Afebrile. Borderline tachycardia on arrival, improving without treatment.  Troponin is negative. BMP and CBC are unremarkable D-dimer is negative. Initial EKG with sinus rhythm, but on reevaluation, the patient's heart rate fluctuates from 80-120 on the monitor from the EKG leads and stays in the 90s on the pulse ox monitor. Repeated EKG with sinus arrhythmia with no tachycardia. CXR is negative.   Patient is to be discharged with recommendation to follow up with GI in regards to today's hospital visit. She is scheduled for an EGD in 3 days.  Chest pain is not likely of cardiac or pulmonary etiology d/t presentation, PERC negative, VSS, no tracheal deviation, no JVD or new murmur, RRR, and breath sounds equal bilaterally. I suspect her chest pain is GERD vs worsening of her gastric ulcers from previous.  Doubt perforated ulcer.  Pt has been advised to return to the ED if CP becomes exertional, associated with diaphoresis or nausea, radiates to left jaw/arm, worsens or becomes concerning in any way. Pt appears reliable for follow up and is agreeable to discharge.     Final Clinical Impressions(s) / ED Diagnoses   Final diagnoses:  Gastroesophageal reflux disease with esophagitis    ED Discharge Orders    None       Barkley BoardsMcDonald, Mia A, PA-C 02/01/18 1433    Jacalyn LefevreHaviland, Julie, MD 02/01/18 1435

## 2018-02-04 ENCOUNTER — Other Ambulatory Visit: Payer: Self-pay

## 2018-02-04 ENCOUNTER — Ambulatory Visit (AMBULATORY_SURGERY_CENTER): Payer: BLUE CROSS/BLUE SHIELD | Admitting: Gastroenterology

## 2018-02-04 ENCOUNTER — Encounter: Payer: Self-pay | Admitting: Gastroenterology

## 2018-02-04 VITALS — BP 109/73 | HR 82 | Temp 98.7°F | Resp 16 | Ht 65.0 in | Wt 162.0 lb

## 2018-02-04 DIAGNOSIS — R1310 Dysphagia, unspecified: Secondary | ICD-10-CM

## 2018-02-04 DIAGNOSIS — R1013 Epigastric pain: Secondary | ICD-10-CM | POA: Diagnosis not present

## 2018-02-04 MED ORDER — SODIUM CHLORIDE 0.9 % IV SOLN: 500.0000 mL | Freq: Once | INTRAVENOUS | Status: DC

## 2018-02-04 MED ORDER — RABEPRAZOLE SODIUM 20 MG PO TBEC: 20.0000 mg | DELAYED_RELEASE_TABLET | Freq: Every day | ORAL | 0 refills | Status: DC

## 2018-02-04 NOTE — Patient Instructions (Signed)
YOU HAD AN ENDOSCOPIC PROCEDURE TODAY AT THE [facility]:   Refer to the procedure report that was given to you for any specific questions about what was found during the examination.  If the procedure report does not answer your questions, please call your gastroenterologist to clarify.  If you requested that your care partner not be given the details of your procedure findings, then the procedure report has been included in a sealed envelope for you to review at your convenience later.  YOU SHOULD EXPECT: Some feelings of bloating in the abdomen. Passage of more gas than usual.  Walking can help get rid of the air that was put into your GI tract during the procedure and reduce the bloating. If you had a lower endoscopy (such as a colonoscopy or flexible sigmoidoscopy) you may notice spotting of blood in your stool or on the toilet paper. If you underwent a bowel prep for your procedure, you may not have a normal bowel movement for a few days.  Please Note:  You might notice some irritation and congestion in your nose or some drainage.  This is from the oxygen used during your procedure.  There is no need for concern and it should clear up in a day or so.  SYMPTOMS TO REPORT IMMEDIATELY:   Following upper endoscopy (EGD)  Vomiting of blood or coffee ground material  New chest pain or pain under the shoulder blades  Painful or persistently difficult swallowing  New shortness of breath  Fever of 100F or higher  Black, tarry-looking stools  For urgent or emergent issues, a gastroenterologist can be reached at any hour by calling [phone].  Be sure to take your new medication as directed.   DIET:  We do recommend a small meal at first, but then you may proceed to your regular diet.  Drink plenty of fluids but you should avoid alcoholic beverages for 24 hours.  ACTIVITY:  You should plan to take it easy for the rest of today and you should NOT DRIVE or use heavy machinery until  tomorrow (because of the sedation medicines used during the test).    FOLLOW UP: Our staff will call the number listed on your records the next business day following your procedure to check on you and address any questions or concerns that you may have regarding the information given to you following your procedure. If we do not reach you, we will leave a message.  However, if you are feeling well and you are not experiencing any problems, there is no need to return our call.  We will assume that you have returned to your regular daily activities without incident.  If any biopsies were taken you will be contacted by phone or by letter within the next 1-3 weeks.  Please call us at [phone] if you have not heard about the biopsies in 3 weeks.    SIGNATURES/CONFIDENTIALITY: You and/or your care partner have signed paperwork which will be entered into your electronic medical record.  These signatures attest to the fact that that the information above on your After Visit Summary has been reviewed and is understood.  Full responsibility of the confidentiality of this discharge information lies with you and/or your care-partner.

## 2018-02-04 NOTE — Progress Notes (Signed)
To PACU, VSS. Report to [email or web address] 

## 2018-02-04 NOTE — Op Note (Signed)
[facility] Patient Name: Stacy Wilkins Procedure Date: 02/04/2018 4:09 PM MRN: [id number] Endoscopist: Napoleon Form , MD Age: 32 Referring MD:  Date of Birth: 1986/04/08 Gender: Female Account #: [id number] Procedure:                Upper GI endoscopy Indications:              Epigastric abdominal pain, Dyspepsia Medicines:                Monitored Anesthesia Care Procedure:                Pre-Anesthesia Assessment:                           - Prior to the procedure, a History and Physical                            was performed, and patient medications and                            allergies were reviewed. [name] tolerance of                            previous anesthesia was also reviewed. The risks                            and benefits of the procedure and the sedation                            options and risks were discussed with the patient.                            All questions were answered, and informed consent                            was obtained. Prior Anticoagulants: The patient has                            taken no previous anticoagulant or antiplatelet                            agents. ASA Grade Assessment: II - A patient with                            mild systemic disease. After reviewing the risks                            and benefits, the patient was deemed in                            satisfactory condition to undergo the procedure.                           After obtaining informed consent, the endoscope was  passed under direct vision. Throughout the                            procedure, the patient's blood pressure, pulse, and                            oxygen saturations were monitored continuously. The                            Endoscope was introduced through the mouth, and                            advanced to the second part of duodenum. The upper                            GI endoscopy  was accomplished without difficulty.                            The patient tolerated the procedure well. Scope In: Scope Out: Findings:                 The esophagus was normal.                           Esophagogastric landmarks were identified: the                            Z-line was found at 35 cm and the gastroesophageal                            junction was found at 35 cm from the incisors.                           No endoscopic abnormality was evident in the                            esophagus to explain the patient's complaint of                            dysphagia.                           The entire examined stomach was normal. Biopsies                            were taken with a cold forceps for Helicobacter                            pylori testing using CLOtest.                           The examined duodenum was normal. Complications:            No immediate complications. Estimated Blood Loss:     Estimated blood loss was minimal. Impression:               -  Normal esophagus.                           - Esophagogastric landmarks identified.                           - No endoscopic esophageal abnormality to explain                            patient's dysphagia.                           - Normal stomach. Biopsied.                           - Normal examined duodenum. Recommendation:           - Patient has a contact number available for                            emergencies. The signs and symptoms of potential                            delayed complications were discussed with the                            patient. Return to normal activities tomorrow.                            Written discharge instructions were provided to the                            patient.                           - Resume previous diet.                           - Continue present medications.                           - Await pathology results.                           - Use  Aciphex (rabeprazole) 20 mg PO daily.                           - Follow an antireflux regimen. Napoleon Form, MD 02/04/2018 4:26:32 PM This report has been signed electronically.

## 2018-02-04 NOTE — Progress Notes (Signed)
Pt's states no medical or surgical changes since previsit or office visit. 

## 2018-02-04 NOTE — Progress Notes (Signed)
Called to room to assist during endoscopic procedure.  Patient ID and intended procedure confirmed with present staff. Received instructions for my participation in the procedure from the performing physician.  

## 2018-02-05 ENCOUNTER — Encounter: Payer: Self-pay | Admitting: Gastroenterology

## 2018-02-05 ENCOUNTER — Telehealth: Payer: Self-pay

## 2018-02-05 LAB — HELICOBACTER PYLORI SCREEN-BIOPSY: UREASE: NEGATIVE

## 2018-02-05 NOTE — Telephone Encounter (Signed)
  Follow up Call-  Call back number 02/04/2018  Post procedure Call Back phone  # [phone]  Permission to leave phone message Yes  Some recent data might be hidden     Patient questions:  Do you have a fever, pain , or abdominal swelling? No. Pain Score  0 *  Have you tolerated food without any problems? Yes.    Have you been able to return to your normal activities? Yes.    Do you have any questions about your discharge instructions: Diet   No. Medications  No. Follow up visit  No.  Do you have questions or concerns about your Care? No.  Actions: * If pain score is 4 or above: No action needed, pain <4.

## 2018-02-05 NOTE — Telephone Encounter (Signed)
EGD was normal on 02/04/18

## 2018-02-06 ENCOUNTER — Telehealth: Payer: Self-pay | Admitting: Gastroenterology

## 2018-02-06 NOTE — Telephone Encounter (Signed)
Patient states she received results from endo on 3.12.19 and wants to know what next steps are since she is still having chest pain.

## 2018-02-06 NOTE — Telephone Encounter (Signed)
Patient says her symptoms are unchanged. She has been on Aciphex since Tuesday. She was on Omeprazole prior to the EGD which was normal.  What would be next in diagnosing her chest pain?

## 2018-02-07 NOTE — Telephone Encounter (Signed)
Since EGD was normal, and she was referred for possible esophagitis or pill induced esophagitis  Which EGD did not show - she should continue PPI for now-a nd needs to be seen by her PCP to sort out non GI causes of chest pain

## 2018-02-07 NOTE — Telephone Encounter (Signed)
Spoke to the patient. She will see her PCP next week and will discuss this with her.

## 2018-02-12 ENCOUNTER — Ambulatory Visit: Payer: BLUE CROSS/BLUE SHIELD | Admitting: Family Medicine

## 2018-02-19 ENCOUNTER — Other Ambulatory Visit: Payer: Self-pay | Admitting: Physician Assistant

## 2018-02-19 DIAGNOSIS — J309 Allergic rhinitis, unspecified: Secondary | ICD-10-CM

## 2018-02-23 ENCOUNTER — Other Ambulatory Visit: Payer: Self-pay | Admitting: Physician Assistant

## 2018-02-23 DIAGNOSIS — Z79899 Other long term (current) drug therapy: Secondary | ICD-10-CM

## 2018-02-23 DIAGNOSIS — Z8719 Personal history of other diseases of the digestive system: Secondary | ICD-10-CM

## 2018-02-23 DIAGNOSIS — Z8711 Personal history of peptic ulcer disease: Secondary | ICD-10-CM

## 2018-02-24 ENCOUNTER — Ambulatory Visit: Payer: BLUE CROSS/BLUE SHIELD | Admitting: Family Medicine

## 2018-02-26 ENCOUNTER — Other Ambulatory Visit: Payer: Self-pay | Admitting: Gastroenterology

## 2018-02-26 DIAGNOSIS — R1013 Epigastric pain: Secondary | ICD-10-CM

## 2018-03-05 ENCOUNTER — Encounter: Payer: Self-pay | Admitting: Physician Assistant

## 2018-03-24 ENCOUNTER — Ambulatory Visit: Payer: BLUE CROSS/BLUE SHIELD | Admitting: Family Medicine

## 2018-03-24 DIAGNOSIS — L309 Dermatitis, unspecified: Secondary | ICD-10-CM | POA: Diagnosis not present

## 2018-03-25 ENCOUNTER — Ambulatory Visit: Payer: BLUE CROSS/BLUE SHIELD | Admitting: Family Medicine

## 2018-03-25 ENCOUNTER — Encounter

## 2018-03-25 DIAGNOSIS — L239 Allergic contact dermatitis, unspecified cause: Secondary | ICD-10-CM | POA: Diagnosis not present

## 2018-03-27 DIAGNOSIS — L239 Allergic contact dermatitis, unspecified cause: Secondary | ICD-10-CM | POA: Diagnosis not present

## 2018-03-31 ENCOUNTER — Other Ambulatory Visit: Payer: Self-pay | Admitting: Physician Assistant

## 2018-03-31 DIAGNOSIS — J309 Allergic rhinitis, unspecified: Secondary | ICD-10-CM

## 2018-03-31 DIAGNOSIS — L259 Unspecified contact dermatitis, unspecified cause: Secondary | ICD-10-CM | POA: Diagnosis not present

## 2018-04-16 ENCOUNTER — Other Ambulatory Visit: Payer: Self-pay | Admitting: Physician Assistant

## 2018-04-16 DIAGNOSIS — J309 Allergic rhinitis, unspecified: Secondary | ICD-10-CM

## 2018-04-17 NOTE — Telephone Encounter (Signed)
Please review for changes to Rx requested by pharmacy.

## 2018-04-18 NOTE — Telephone Encounter (Signed)
Please advise/refill: Fluticasone Prop

## 2018-04-22 ENCOUNTER — Ambulatory Visit: Payer: BLUE CROSS/BLUE SHIELD | Admitting: Sports Medicine

## 2018-05-13 ENCOUNTER — Ambulatory Visit: Payer: Self-pay | Admitting: *Deleted

## 2018-05-13 ENCOUNTER — Ambulatory Visit: Payer: Self-pay | Admitting: Physician Assistant

## 2018-05-13 NOTE — Telephone Encounter (Signed)
Patient is calling to report that she is having episodes of increased heart rate. Her HR was 147. She does have an appointment with the cardiologist 6/27- but with the episodes occurring this close together- they felt she should be seen.  Reason for Disposition . [1] Heart beating very rapidly (e.g., > 140 / minute) AND [2] not present now  (Exception: during exercise)  Answer Assessment - Initial Assessment Questions 1. DESCRIPTION: "Please describe your heart rate or heart beat that you are having" (e.g., fast/slow, regular/irregular, skipped or extra beats, "palpitations")     113 now- was 147   Past 2 days patient has notices heart racing 2. ONSET: "When did it start?" (Minutes, hours or days)      Today- 1:43 3. DURATION: "How long does it last" (e.g., seconds, minutes, hours)     10 minutes 4. PATTERN "Does it come and go, or has it been constant since it started?"  "Does it get worse with exertion?"   "Are you feeling it now?"     Comes/goes- happened twice today-could be anxiety 5. TAP: "Using your hand, can you tap out what you are feeling on a chair or table in front of you, so that I can hear?" (Note: not all patients can do this)       n/a 6. HEART RATE: "Can you tell me your heart rate?" "How many beats in 15 seconds?"  (Note: not all patients can do this)       Now- P 101 7. RECURRENT SYMPTOM: "Have you ever had this before?" If so, ask: "When was the last time?" and "What happened that time?"      2 days- first time may have lasted longer 8. CAUSE: "What do you think is causing the palpitations?"     Anxiety  9. CARDIAC HISTORY: "Do you have any history of heart disease?" (e.g., heart attack, angina, bypass surgery, angioplasty, arrhythmia)      Patient was given a beta blocker before for tachycardia  10. OTHER SYMPTOMS: "Do you have any other symptoms?" (e.g., dizziness, chest pain, sweating, difficulty breathing)       no 11. PREGNANCY: "Is there any chance you are  pregnant?" "When was your last menstrual period?"       No- LMP- nexplanon  Protocols used: HEART RATE AND HEARTBEAT QUESTIONS-A-AH

## 2018-05-16 ENCOUNTER — Other Ambulatory Visit: Payer: Self-pay | Admitting: Physician Assistant

## 2018-05-16 DIAGNOSIS — J309 Allergic rhinitis, unspecified: Secondary | ICD-10-CM

## 2018-05-20 ENCOUNTER — Other Ambulatory Visit: Payer: Self-pay | Admitting: Gastroenterology

## 2018-05-20 DIAGNOSIS — R1013 Epigastric pain: Secondary | ICD-10-CM

## 2018-05-21 NOTE — Telephone Encounter (Signed)
done in error 

## 2018-05-22 DIAGNOSIS — B373 Candidiasis of vulva and vagina: Secondary | ICD-10-CM | POA: Diagnosis not present

## 2018-05-22 DIAGNOSIS — N760 Acute vaginitis: Secondary | ICD-10-CM | POA: Diagnosis not present

## 2018-05-22 DIAGNOSIS — Z118 Encounter for screening for other infectious and parasitic diseases: Secondary | ICD-10-CM | POA: Diagnosis not present

## 2018-05-22 DIAGNOSIS — Z1159 Encounter for screening for other viral diseases: Secondary | ICD-10-CM | POA: Diagnosis not present

## 2018-06-09 DIAGNOSIS — Z86711 Personal history of pulmonary embolism: Secondary | ICD-10-CM | POA: Diagnosis not present

## 2018-06-09 DIAGNOSIS — R0789 Other chest pain: Secondary | ICD-10-CM | POA: Diagnosis not present

## 2018-06-09 DIAGNOSIS — R002 Palpitations: Secondary | ICD-10-CM | POA: Diagnosis not present

## 2018-06-23 DIAGNOSIS — L719 Rosacea, unspecified: Secondary | ICD-10-CM | POA: Diagnosis not present

## 2018-06-23 DIAGNOSIS — L858 Other specified epidermal thickening: Secondary | ICD-10-CM | POA: Diagnosis not present

## 2018-06-23 DIAGNOSIS — L700 Acne vulgaris: Secondary | ICD-10-CM | POA: Diagnosis not present

## 2018-07-27 ENCOUNTER — Other Ambulatory Visit: Payer: Self-pay | Admitting: Physician Assistant

## 2018-07-27 DIAGNOSIS — Z79899 Other long term (current) drug therapy: Secondary | ICD-10-CM

## 2018-07-27 DIAGNOSIS — Z8719 Personal history of other diseases of the digestive system: Secondary | ICD-10-CM

## 2018-07-27 DIAGNOSIS — Z8711 Personal history of peptic ulcer disease: Secondary | ICD-10-CM

## 2018-08-02 IMAGING — CT CT ANGIO CHEST
2 of 6 series · 19 of 36 positions shown · IV contrast (ISOVUE 370)
Comparison: Chest CT 03/05/2017

CLINICAL DATA: Central chest pain.  Short of breath.  Prior DVT.

EXAM:
CT ANGIOGRAPHY CHEST WITH CONTRAST
TECHNIQUE: Multidetector CT imaging of the chest was performed using the
standard protocol during bolus administration of intravenous
contrast. Multiplanar CT image reconstructions and MIPs were
obtained to evaluate the vascular anatomy.
CONTRAST:  100 mL Isovue

[Series 7: thins for pacs · axial · 0.63mm/px · z∈[-394,-156]mm · 18 of 265 slices shown]
[im 14/265  lung]
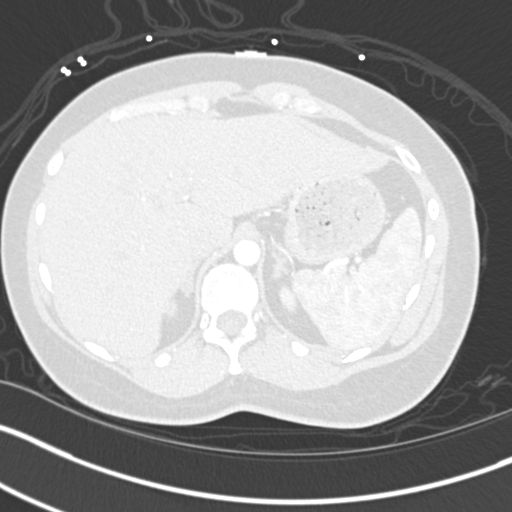
[im 27/265  mediastinal]
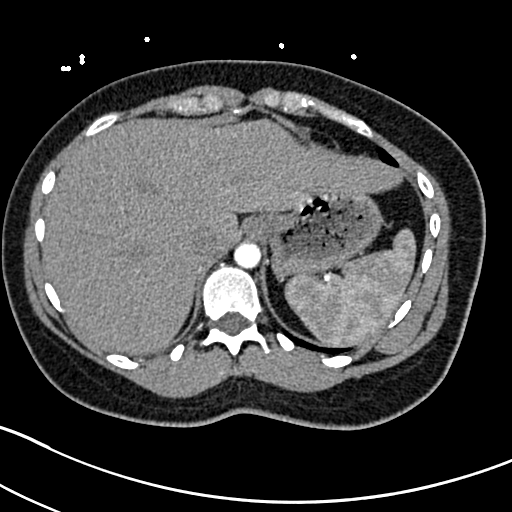
[im 40/265  lung]
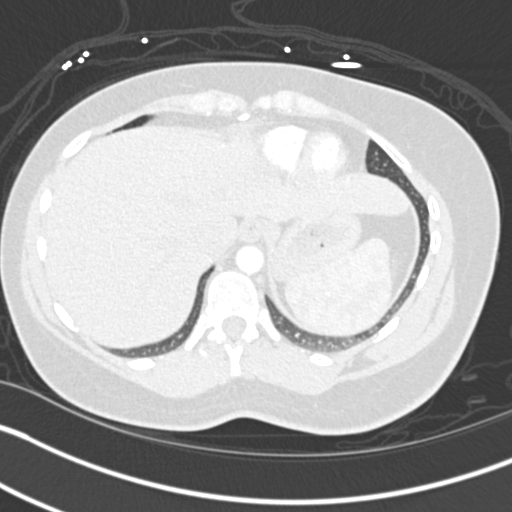
[im 53/265  mediastinal]
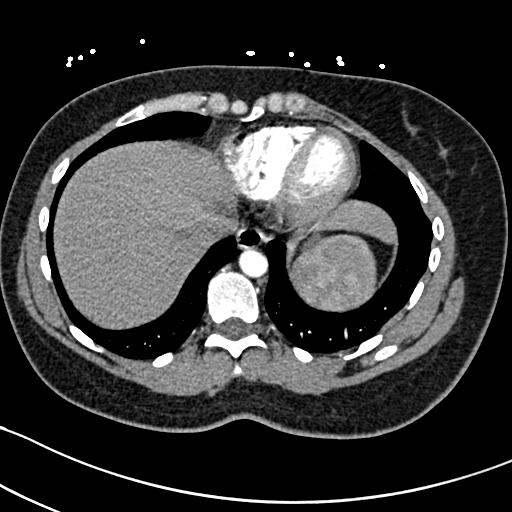
[im 67/265  lung]
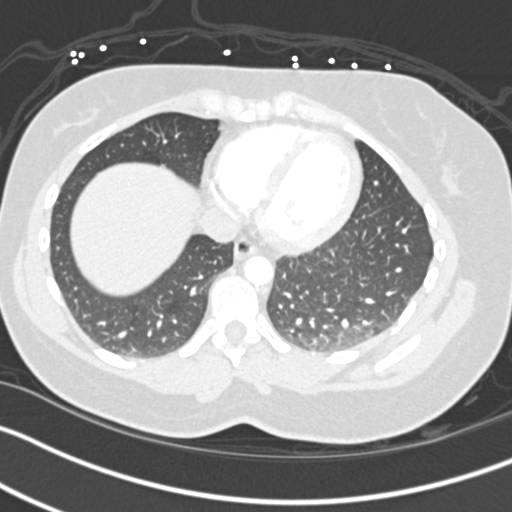
[im 80/265  mediastinal]
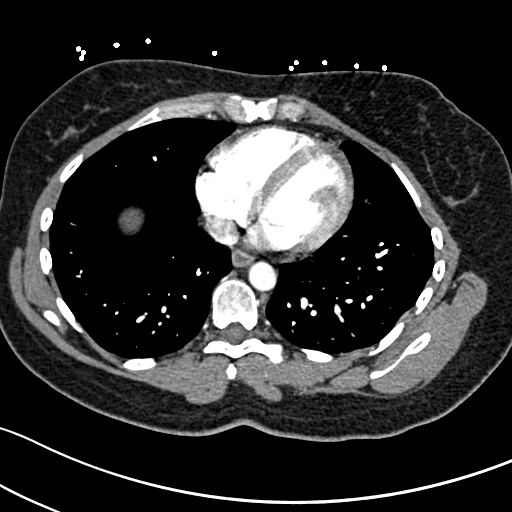
[im 93/265  lung]
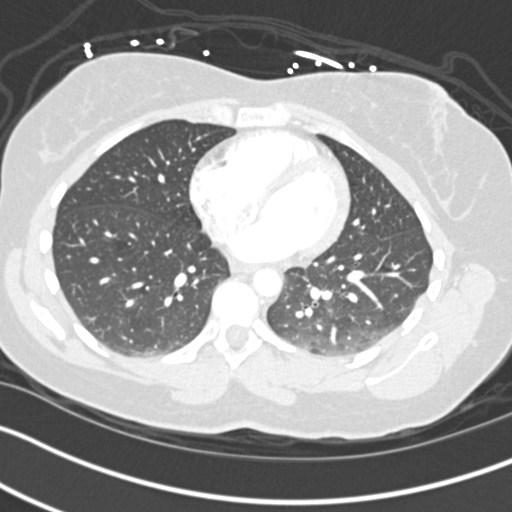
[im 106/265  mediastinal]
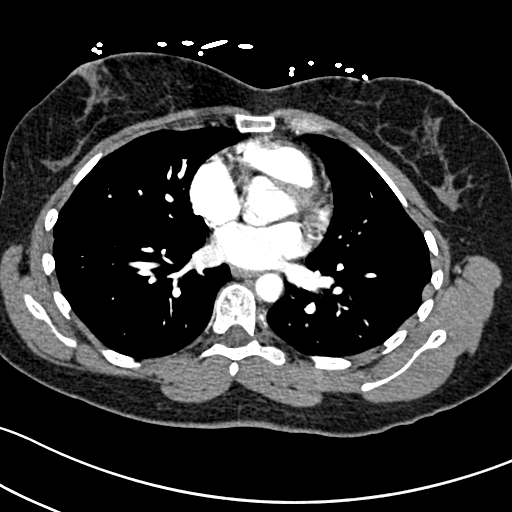
[im 119/265  lung]
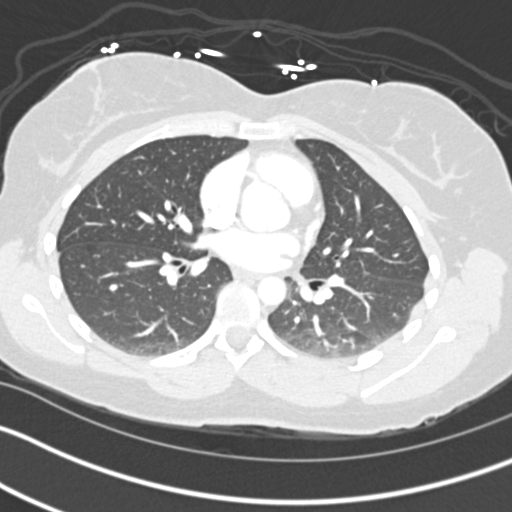
[im 146/265  mediastinal]
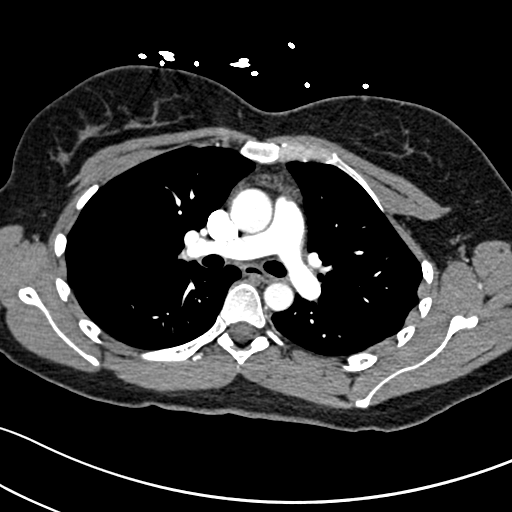
[im 159/265  lung]
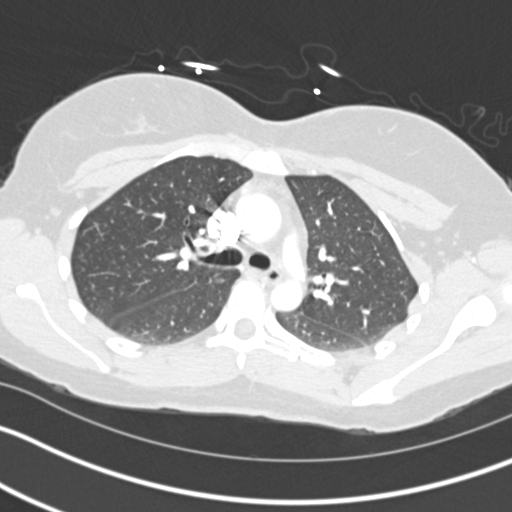
[im 172/265  mediastinal]
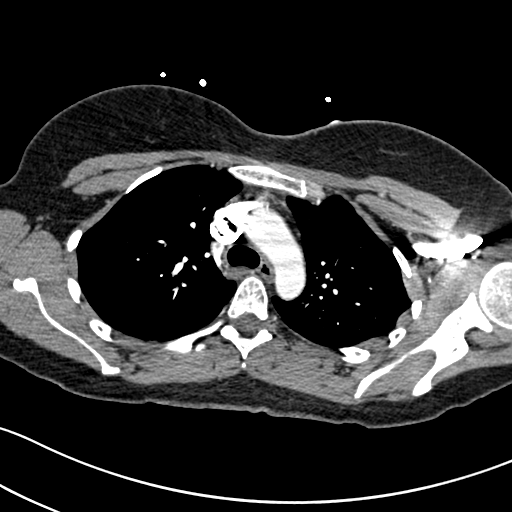
[im 185/265  lung]
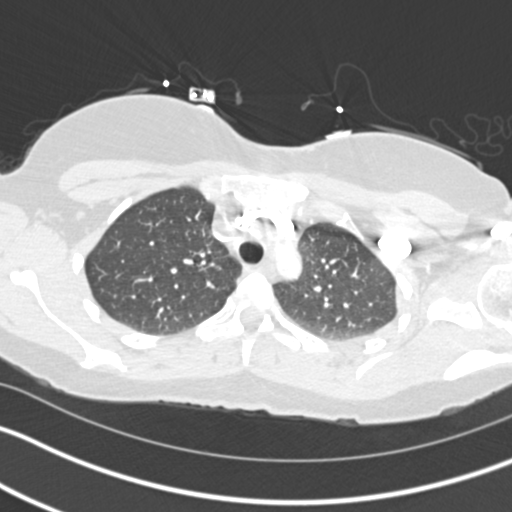
[im 199/265  mediastinal]
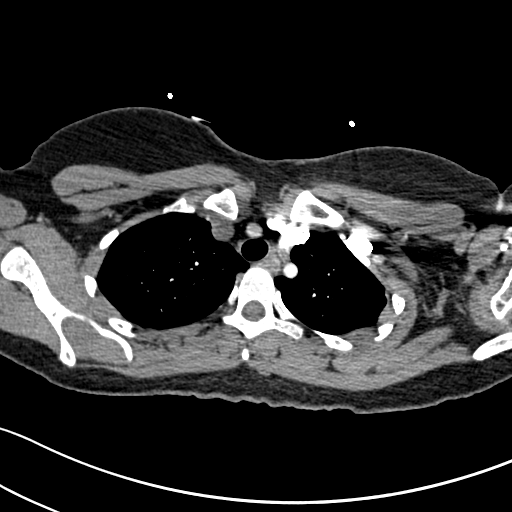
[im 212/265  lung]
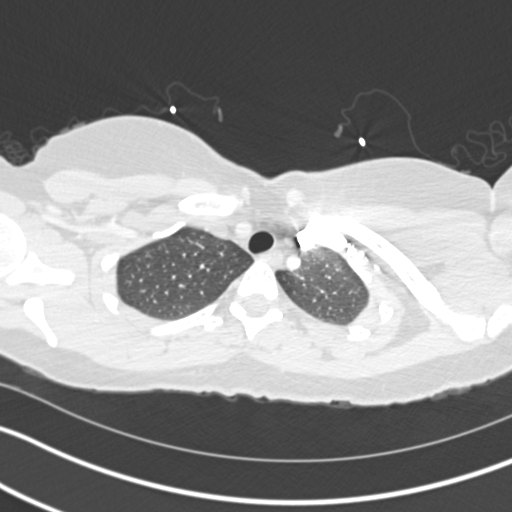
[im 225/265  mediastinal]
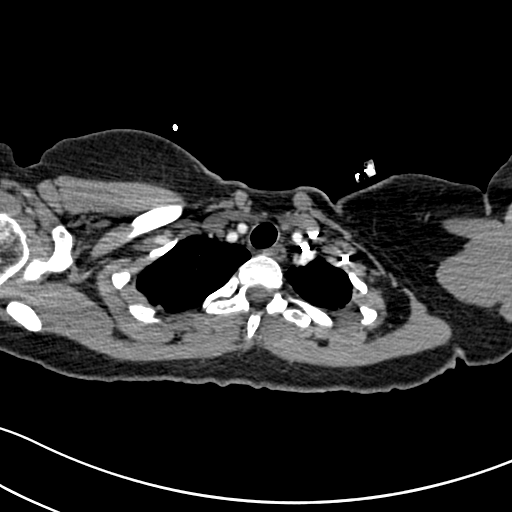
[im 238/265  lung]
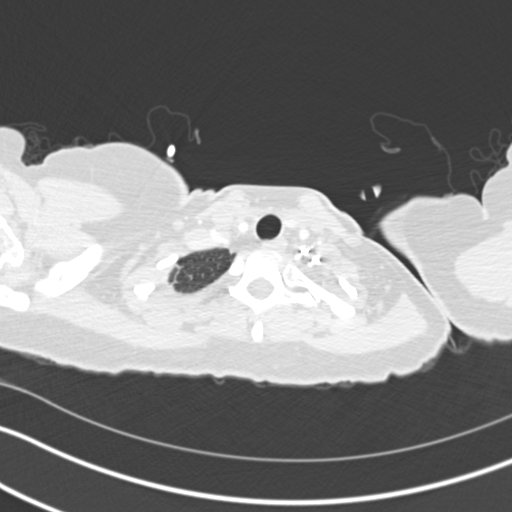
[im 251/265  mediastinal]
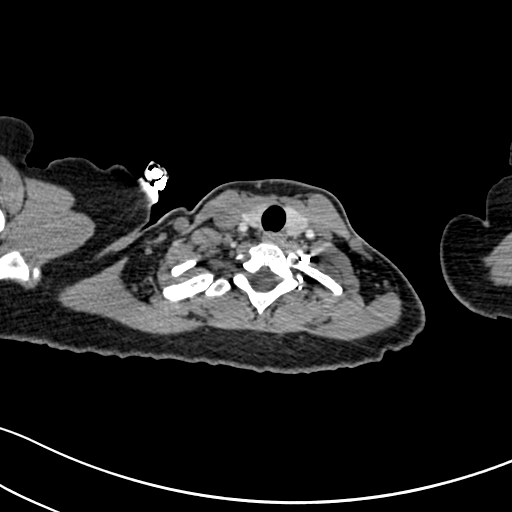

[Series 8: coronal mpr · coronal · 0.55mm/px · 1 of 103 slices shown]
[im 52/103  mediastinal]
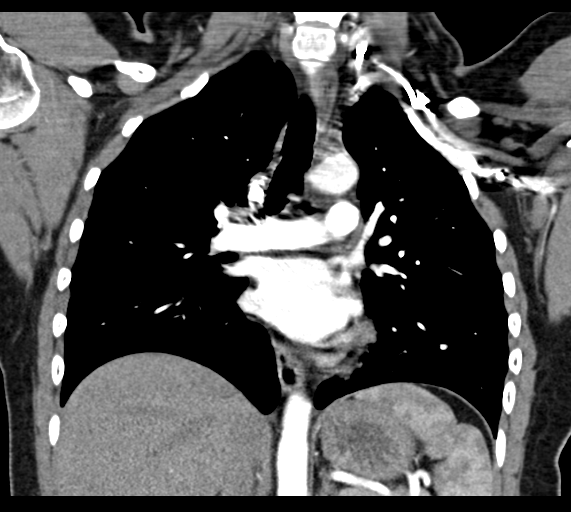

[19 of 36 positions shown; findings below may reference images not displayed]

FINDINGS: Cardiovascular: No filling defects within the pulmonary arteries to
suggest acute pulmonary embolism.

Mediastinum/Nodes: No axillary supraclavicular adenopathy. No
mediastinal hilar adenopathy. No pericardial fluid. Esophagus
normal.

Lungs/Pleura: No pulmonary infarction. No infiltrate or
pneumothorax. No pleural fluid airways are normal.

Upper Abdomen: Limited view of the liver, kidneys, pancreas are
unremarkable. Normal adrenal glands.

Musculoskeletal: No aggressive osseous lesion.

Review of the MIP images confirms the above findings.
IMPRESSION: 1. No evidence acute pulmonary embolism.
2. No residual pulmonary emboli from prior CT comparison.

## 2018-08-06 ENCOUNTER — Telehealth: Payer: Self-pay | Admitting: Gastroenterology

## 2018-08-06 MED ORDER — PANTOPRAZOLE SODIUM 40 MG PO TBEC: 40.0000 mg | DELAYED_RELEASE_TABLET | Freq: Every day | ORAL | 3 refills | Status: DC

## 2018-08-06 MED ORDER — ESOMEPRAZOLE MAGNESIUM 40 MG PO CPDR: 40.0000 mg | DELAYED_RELEASE_CAPSULE | Freq: Every day | ORAL | 3 refills | Status: DC

## 2018-08-06 NOTE — Telephone Encounter (Signed)
Please advise   She cant take omeprazole and Aciphex's copay is $87

## 2018-08-06 NOTE — Telephone Encounter (Signed)
Sent in both Nexium and Protonix for patient as requested by patient, she wants to see which med is going to be cheaper for her

## 2018-08-06 NOTE — Telephone Encounter (Signed)
Can you please try sending prescription for Protonix  or Nexium and see if it is better covered by her insurance

## 2018-08-20 ENCOUNTER — Ambulatory Visit: Payer: Self-pay

## 2018-08-20 NOTE — Telephone Encounter (Signed)
Pt called with C/O headache since Saturday 21st. She states that she was sensitive to light on Saturday and pain was severe. Today she rates pain at mild. She reports some sinus congestion and a sore throat on Saturday.  She has been under stress over the past days. Pt has been diagnosed with migraines in the past but it "has been a long time" when questioned about last migraine. Pt denies fever. She states she has a little stiffness in her neck from stress. Appointment made per protocol. Care advice given. Pt verbalized understanding of all instruction.   Reason for Disposition . Headache is a chronic symptom (recurrent or ongoing AND present > 4 weeks)  Answer Assessment - Initial Assessment Questions 1. LOCATION: "Where does it hurt?"      All over 2. ONSET: "When did the headache start?" (Minutes, hours or days)      Saturday 3. PATTERN: "Does the pain come and go, or has it been constant since it started?"     Comes and goes 4. SEVERITY: "How bad is the pain?" and "What does it keep you from doing?"  (e.g., Scale 1-10; mild, moderate, or severe)   - MILD (1-3): doesn't interfere with normal activities    - MODERATE (4-7): interferes with normal activities or awakens from sleep    - SEVERE (8-10): excruciating pain, unable to do any normal activities        mild 5. RECURRENT SYMPTOM: "Have you ever had headaches before?" If so, ask: "When was the last time?" and "What happened that time?"      Its been a while 6. CAUSE: "What do you think is causing the headache?"     Hx of migraine sinus problems stress recently 7. MIGRAINE: "Have you been diagnosed with migraine headaches?" If so, ask: "Is this headache similar?"      yes 8. HEAD INJURY: "Has there been any recent injury to the head?"      no 9. OTHER SYMPTOMS: "Do you have any other symptoms?" (fever, stiff neck, eye pain, sore throat, cold symptoms)     A little sti ffness in her neck 10. PREGNANCY: "Is there any chance you are  pregnant?" "When was your last menstrual period?"       08/09/18  Protocols used: HEADACHE-A-AH

## 2018-08-25 ENCOUNTER — Ambulatory Visit: Payer: Self-pay | Admitting: Family Medicine

## 2018-10-06 DIAGNOSIS — R002 Palpitations: Secondary | ICD-10-CM | POA: Diagnosis not present

## 2018-10-06 DIAGNOSIS — Z86711 Personal history of pulmonary embolism: Secondary | ICD-10-CM | POA: Diagnosis not present

## 2018-10-20 ENCOUNTER — Encounter: Payer: Self-pay | Admitting: Family Medicine

## 2018-10-20 ENCOUNTER — Other Ambulatory Visit: Payer: Self-pay

## 2018-10-20 ENCOUNTER — Telehealth: Payer: Self-pay

## 2018-10-20 ENCOUNTER — Ambulatory Visit (INDEPENDENT_AMBULATORY_CARE_PROVIDER_SITE_OTHER): Payer: 59 | Admitting: Family Medicine

## 2018-10-20 ENCOUNTER — Ambulatory Visit: Payer: Self-pay

## 2018-10-20 VITALS — BP 114/78 | HR 92 | Temp 98.3°F | Ht 65.0 in | Wt 168.0 lb

## 2018-10-20 DIAGNOSIS — R1032 Left lower quadrant pain: Secondary | ICD-10-CM | POA: Diagnosis not present

## 2018-10-20 DIAGNOSIS — N390 Urinary tract infection, site not specified: Secondary | ICD-10-CM

## 2018-10-20 DIAGNOSIS — R3915 Urgency of urination: Secondary | ICD-10-CM

## 2018-10-20 LAB — POCT URINE PREGNANCY: Preg Test, Ur: NEGATIVE

## 2018-10-20 LAB — POCT URINALYSIS DIP (MANUAL ENTRY)
Bilirubin, UA: NEGATIVE
Blood, UA: NEGATIVE
Glucose, UA: NEGATIVE mg/dL
Ketones, POC UA: NEGATIVE mg/dL
Nitrite, UA: NEGATIVE
Protein Ur, POC: NEGATIVE mg/dL
Spec Grav, UA: >=1.03 — AB (ref 1.010–1.025)
Urobilinogen, UA: 0.2 E.U./dL
pH, UA: 6 (ref 5.0–8.0)

## 2018-10-20 LAB — POC MICROSCOPIC URINALYSIS (UMFC): Mucus: ABSENT

## 2018-10-20 MED ORDER — PHENAZOPYRIDINE HCL 100 MG PO TABS: 100.0000 mg | ORAL_TABLET | Freq: Three times a day (TID) | ORAL | 0 refills | Status: DC | PRN

## 2018-10-20 MED ORDER — NITROFURANTOIN MONOHYD MACRO 100 MG PO CAPS: 100.0000 mg | ORAL_CAPSULE | Freq: Two times a day (BID) | ORAL | 0 refills | Status: DC

## 2018-10-20 NOTE — Patient Instructions (Addendum)
Start Macrobid twice per day, make sure to drink plenty of fluids.  Pyridium if needed.  If pain is not improving in the next 3 to 4 days or any worsening symptoms such as fever or vomiting return for recheck here or in ER.    Urinary Tract Infection, Adult A urinary tract infection (UTI) is an infection of any part of the urinary tract. The urinary tract includes the:  Kidneys.  Ureters.  Bladder.  Urethra.  These organs make, store, and get rid of pee (urine) in the body. Follow these instructions at home:  Take over-the-counter and prescription medicines only as told by your doctor.  If you were prescribed an antibiotic medicine, take it as told by your doctor. Do not stop taking the antibiotic even if you start to feel better.  Avoid the following drinks: ? Alcohol. ? Caffeine. ? Tea. ? Carbonated drinks.  Drink enough fluid to keep your pee clear or pale yellow.  Keep all follow-up visits as told by your doctor. This is important.  Make sure to: ? Empty your bladder often and completely. Do not to hold pee for long periods of time. ? Empty your bladder before and after sex. ? Wipe from front to back after a bowel movement if you are female. Use each tissue one time when you wipe. Contact a doctor if:  You have back pain.  You have a fever.  You feel sick to your stomach (nauseous).  You throw up (vomit).  Your symptoms do not get better after 3 days.  Your symptoms go away and then come back. Get help right away if:  You have very bad back pain.  You have very bad lower belly (abdominal) pain.  You are throwing up and cannot keep down any medicines or water. This information is not intended to replace advice given to you by your health care provider. Make sure you discuss any questions you have with your health care provider. Document Released: 04/30/2008 Document Revised: 04/19/2016 Document Reviewed: 10/03/2015 Elsevier Interactive Patient Education   2018 Elsevier Inc.   Abdominal Pain, Adult Abdominal pain can be caused by many things. Often, abdominal pain is not serious and it gets better with no treatment or by being treated at home. However, sometimes abdominal pain is serious. Your health care provider will do a medical history and a physical exam to try to determine the cause of your abdominal pain. Follow these instructions at home:  Take over-the-counter and prescription medicines only as told by your health care provider. Do not take a laxative unless told by your health care provider.  Drink enough fluid to keep your urine clear or pale yellow.  Watch your condition for any changes.  Keep all follow-up visits as told by your health care provider. This is important. Contact a health care provider if:  Your abdominal pain changes or gets worse.  You are not hungry or you lose weight without trying.  You are constipated or have diarrhea for more than 2-3 days.  You have pain when you urinate or have a bowel movement.  Your abdominal pain wakes you up at night.  Your pain gets worse with meals, after eating, or with certain foods.  You are throwing up and cannot keep anything down.  You have a fever. Get help right away if:  Your pain does not go away as soon as your health care provider told you to expect.  You cannot stop throwing up.  Your pain  is only in areas of the abdomen, such as the right side or the left lower portion of the abdomen.  You have bloody or black stools, or stools that look like tar.  You have severe pain, cramping, or bloating in your abdomen.  You have signs of dehydration, such as: ? Dark urine, very little urine, or no urine. ? Cracked lips. ? Dry mouth. ? Sunken eyes. ? Sleepiness. ? Weakness. This information is not intended to replace advice given to you by your health care provider. Make sure you discuss any questions you have with your health care provider. Document  Released: 08/22/2005 Document Revised: 06/01/2016 Document Reviewed: 04/25/2016 Elsevier Interactive Patient Education  [employer]2018 Elsevier Inc.   If you have lab work done today you will be contacted with your lab results within the next 2 weeks.  If you have not heard from us then please contact us. The fastest way to get your results is to register for My Chart.   IF you received an x-ray today, you will receive an invoice from [facility]. Please contact [facility] at [phone] with questions or concerns regarding your invoice.   IF you received labwork today, you will receive an invoice from [facility]. Please contact [facility] at [phone] with questions or concerns regarding your invoice.   Our billing staff will not be able to assist you with questions regarding bills from these companies.  You will be contacted with the lab results as soon as they are available. The fastest way to get your results is to activate your My Chart account. Instructions are located on the last page of this paperwork. If you have not heard from us regarding the results in 2 weeks, please contact this office.

## 2018-10-20 NOTE — Progress Notes (Addendum)
Subjective:  By signing my name below, I, Stann Ore, attest that this documentation has been prepared under the direction and in the presence of Meredith Staggers, MD. Electronically Signed: Stann Ore, Scribe. 10/20/2018 , 1:45 PM .  Patient was seen in Room 10 .   Patient ID: Stacy Wilkins, female    DOB: 05-Jul-1986, 32 y.o.   MRN: [id number] Chief Complaint  Patient presents with  . Abdominal Pain    x4 days   HPI Stacy Wilkins is a 31 y.o. female  Patient complains of LLQ abdominal pain that started 4 days ago. She reports having associated nausea and urinary urgency with this abdominal pain. Initially, she thought it was due to her period as it's similar pain. She states her periods are irregular due to implant. She denies vaginal bleeding or discharge. She informs walking makes her abdominal pain worse. She denies past abdominal surgical history. She denies vomiting or diarrhea. She has had bowel movement yesterday that was normal. She denies any blood in stools or dark tarry stools. She has taken ibuprofen without relief. She denies any known sick contact.   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   Patient Active Problem List   Diagnosis Date Noted  . Anxiety 09/16/2017  . History of DVT (deep vein thrombosis) 03/21/2017  . History of pulmonary embolism 03/21/2017  . Food allergy 11/08/2016  . Palpitations 02/27/2016  . Hemorrhoids 09/29/2015   Past Medical History:  Diagnosis Date  . Anxiety   . Depression   . DVT (deep venous thrombosis) (HCC)   . Generalized headaches   . GERD (gastroesophageal reflux disease)   . Hyperlipidemia   . Migraine   . Ulcer    Past Surgical History:  Procedure Laterality Date  . bunionectomy     . EYE SURGERY    . TONSILLECTOMY AND ADENOIDECTOMY     Allergies  Allergen Reactions  . Esomeprazole Magnesium Other (See Comments)     Unknown reaction   . Cabbage   . Mustard Seed   . Onion   . Other     Green peppers   . Tomato    Prior to Admission medications   Medication Sig Start Date End Date Taking? Authorizing Provider  esomeprazole (NEXIUM) 40 MG capsule Take 1 capsule (40 mg total) by mouth daily before breakfast. 08/06/18   Nandigam, Eleonore Chiquito, MD  etonogestrel (NEXPLANON) 68 MG IMPL implant 1 each by Subdermal route once.    [provider]  fluticasone (FLONASE) 50 MCG/ACT nasal spray SPRAY 2 SPRAYS INTO EACH NOSTRIL EVERY DAY 04/18/18   Benjiman Core D, PA-C  fluticasone [facility]) 50 MCG/ACT nasal spray SPRAY 2 SPRAYS INTO EACH NOSTRIL EVERY DAY 05/16/18   Benjiman Core D, PA-C  hydrOXYzine (ATARAX/VISTARIL) 25 MG tablet Take 0.5-1 tablets (12.5-25 mg total) by mouth every 8 (eight) hours as needed for anxiety. 12/25/17   Benjiman Core D, PA-C  Multiple Vitamins-Minerals (THERA-M) TABS Take 1 tablet by mouth daily.     [provider]  omeprazole (PRILOSEC) 40 MG capsule Take 1 capsule (40 mg total) by mouth 2 (two) times daily. Patient not taking: Reported on 02/04/2018 01/15/18   Esterwood, Amy S, PA-C  pantoprazole (PROTONIX) 40 MG tablet Take 1 tablet (40 mg total) by mouth daily. 08/06/18   Napoleon Form, MD  RABEprazole (ACIPHEX) 20 MG tablet TAKE 1 TABLET BY MOUTH EVERY DAY 05/20/18   Napoleon Form, MD   Social History   Socioeconomic  History  . Marital status: Single    Spouse name: Not on file  . Number of children: 0  . Years of education: Not on file  . Highest education level: Not on file  Occupational History  . Occupation: Billing/Insurance    Comment: Nutritional therapist  Social Needs  . Financial resource strain: Not on file  . Food insecurity:    Worry: Not on file    Inability: Not on file  . Transportation needs:    Medical: Not on file    Non-medical: Not on file  Tobacco Use  . Smoking status: Former Smoker    Last attempt to quit: 2014    Years since quitting: 5.9  . Smokeless tobacco: Never Used  . Tobacco comment:  used to smoke socially  Substance and Sexual Activity  . Alcohol use: No    Alcohol/week: 0.0 standard drinks    Frequency: Never  . Drug use: No  . Sexual activity: Not Currently    Birth control/protection: Implant  Lifestyle  . Physical activity:    Days per week: Not on file    Minutes per session: Not on file  . Stress: Not on file  Relationships  . Social connections:    Talks on phone: Not on file    Gets together: Not on file    Attends religious service: Not on file    Active member of club or organization: Not on file    Attends meetings of clubs or organizations: Not on file    Relationship status: Not on file  . Intimate partner violence:    Fear of current or ex partner: Not on file    Emotionally abused: Not on file    Physically abused: Not on file    Forced sexual activity: Not on file  Other Topics Concern  . Not on file  Social History Narrative  . Not on file   Review of Systems  Constitutional: Negative for chills, fatigue, fever and unexpected weight change.  Respiratory: Negative for cough.   Gastrointestinal: Positive for abdominal pain. Negative for anal bleeding, blood in stool, constipation, diarrhea, nausea and vomiting.  Genitourinary: Positive for urgency. Negative for dysuria, frequency, vaginal bleeding and vaginal discharge.  Skin: Negative for rash and wound.  Neurological: Negative for dizziness, weakness and headaches.       Objective:   Physical Exam  Constitutional: She is oriented to person, place, and time. She appears well-developed and well-nourished. No distress.  HENT:  Head: Normocephalic and atraumatic.  Eyes: Pupils are equal, round, and reactive to light. EOM are normal.  Neck: Neck supple.  Cardiovascular: Normal rate, regular rhythm and normal heart sounds.  No murmur heard. Pulmonary/Chest: Effort normal and breath sounds normal. No respiratory distress.  Abdominal: Bowel sounds are normal. There is tenderness in the  left lower quadrant. There is no CVA tenderness.  Abdomen flat, LLQ tenderness; suprapubic non tender  Musculoskeletal: Normal range of motion.  Neurological: She is alert and oriented to person, place, and time.  Skin: Skin is warm and dry.  Psychiatric: She has a normal mood and affect. Her behavior is normal.  Nursing note and vitals reviewed.   Vitals:   10/20/18 1329  BP: 114/78  Pulse: 92  Temp: 98.3 F (36.8 C)  TempSrc: Oral  SpO2: 97%  Weight: 168 lb (76.2 kg)  Height: 5\' 5"  (1.651 m)   Results for orders placed or performed in visit on 10/20/18  POCT urinalysis dipstick  Result Value Ref Range   Color, UA yellow yellow   Clarity, UA cloudy (A) clear   Glucose, UA negative negative mg/dL   Bilirubin, UA negative negative   Ketones, POC UA negative negative mg/dL   Spec Grav, UA >=1.610 (A) 1.010 - 1.025   Blood, UA negative negative   pH, UA 6.0 5.0 - 8.0   Protein Ur, POC negative negative mg/dL   Urobilinogen, UA 0.2 0.2 or 1.0 E.U./dL   Nitrite, UA Negative Negative   Leukocytes, UA Trace (A) Negative  POCT Microscopic Urinalysis ([facility])  Result Value Ref Range   WBC,UR,HPF,POC Moderate (A) None WBC/hpf   RBC,UR,HPF,POC None None RBC/hpf   Bacteria None None, Too numerous to count   Mucus Absent Absent   Epithelial Cells, UR Per Microscopy Moderate (A) None, Too numerous to count cells/hpf  POCT urine pregnancy  Result Value Ref Range   Preg Test, Ur Negative Negative       Assessment & Plan:    Stacy Wilkins is a 32 y.o. female LLQ abdominal pain - Plan: POCT urinalysis dipstick, POCT Microscopic Urinalysis ([facility]), POCT urine pregnancy, nitrofurantoin, macrocrystal-monohydrate, (MACROBID) 100 MG capsule, phenazopyridine (PYRIDIUM) 100 MG tablet  Urinary urgency - Plan: POCT urinalysis dipstick, POCT Microscopic Urinalysis ([facility]), POCT urine pregnancy, nitrofurantoin, macrocrystal-monohydrate, (MACROBID) 100 MG capsule, phenazopyridine (PYRIDIUM)  100 MG tablet  Urinary tract infection without hematuria, site unspecified - Plan: nitrofurantoin, macrocrystal-monohydrate, (MACROBID) 100 MG capsule, Urine Culture  Suspected urinary tract infection without concerning findings on exam.  Start Macrobid twice per day for 1 week, Pyridium if needed for symptoms, push fluids and RTC/ER precautions given if worse.  Check urine culture.   Meds ordered this encounter  Medications  . nitrofurantoin, macrocrystal-monohydrate, (MACROBID) 100 MG capsule    Sig: Take 1 capsule (100 mg total) by mouth 2 (two) times daily.    Dispense:  14 capsule    Refill:  0  . phenazopyridine (PYRIDIUM) 100 MG tablet    Sig: Take 1 tablet (100 mg total) by mouth 3 (three) times daily as needed for pain.    Dispense:  10 tablet    Refill:  0   Patient Instructions    Start Macrobid twice per day, make sure to drink plenty of fluids.  Pyridium if needed.  If pain is not improving in the next 3 to 4 days or any worsening symptoms such as fever or vomiting return for recheck here or in ER.    Urinary Tract Infection, Adult A urinary tract infection (UTI) is an infection of any part of the urinary tract. The urinary tract includes the:  Kidneys.  Ureters.  Bladder.  Urethra.  These organs make, store, and get rid of pee (urine) in the body. Follow these instructions at home:  Take over-the-counter and prescription medicines only as told by your doctor.  If you were prescribed an antibiotic medicine, take it as told by your doctor. Do not stop taking the antibiotic even if you start to feel better.  Avoid the following drinks: ? Alcohol. ? Caffeine. ? Tea. ? Carbonated drinks.  Drink enough fluid to keep your pee clear or pale yellow.  Keep all follow-up visits as told by your doctor. This is important.  Make sure to: ? Empty your bladder often and completely. Do not to hold pee for long periods of time. ? Empty your bladder before and after  sex. ? Wipe from front to back after a bowel movement if you are  female. Use each tissue one time when you wipe. Contact a doctor if:  You have back pain.  You have a fever.  You feel sick to your stomach (nauseous).  You throw up (vomit).  Your symptoms do not get better after 3 days.  Your symptoms go away and then come back. Get help right away if:  You have very bad back pain.  You have very bad lower belly (abdominal) pain.  You are throwing up and cannot keep down any medicines or water. This information is not intended to replace advice given to you by your health care provider. Make sure you discuss any questions you have with your health care provider. Document Released: 04/30/2008 Document Revised: 04/19/2016 Document Reviewed: 10/03/2015 Elsevier Interactive Patient Education  2018 Elsevier Inc.   Abdominal Pain, Adult Abdominal pain can be caused by many things. Often, abdominal pain is not serious and it gets better with no treatment or by being treated at home. However, sometimes abdominal pain is serious. Your health care provider will do a medical history and a physical exam to try to determine the cause of your abdominal pain. Follow these instructions at home:  Take over-the-counter and prescription medicines only as told by your health care provider. Do not take a laxative unless told by your health care provider.  Drink enough fluid to keep your urine clear or pale yellow.  Watch your condition for any changes.  Keep all follow-up visits as told by your health care provider. This is important. Contact a health care provider if:  Your abdominal pain changes or gets worse.  You are not hungry or you lose weight without trying.  You are constipated or have diarrhea for more than 2-3 days.  You have pain when you urinate or have a bowel movement.  Your abdominal pain wakes you up at night.  Your pain gets worse with meals, after eating, or with  certain foods.  You are throwing up and cannot keep anything down.  You have a fever. Get help right away if:  Your pain does not go away as soon as your health care provider told you to expect.  You cannot stop throwing up.  Your pain is only in areas of the abdomen, such as the right side or the left lower portion of the abdomen.  You have bloody or black stools, or stools that look like tar.  You have severe pain, cramping, or bloating in your abdomen.  You have signs of dehydration, such as: ? Dark urine, very little urine, or no urine. ? Cracked lips. ? Dry mouth. ? Sunken eyes. ? Sleepiness. ? Weakness. This information is not intended to replace advice given to you by your health care provider. Make sure you discuss any questions you have with your health care provider. Document Released: 08/22/2005 Document Revised: 06/01/2016 Document Reviewed: 04/25/2016 Elsevier Interactive Patient Education  [employer].   If you have lab work done today you will be contacted with your lab results within the next 2 weeks.  If you have not heard from [place] then please contact us. The fastest way to get your results is to register for My Chart.   IF you received an x-ray today, you will receive an invoice from [facility]. Please contact [facility] at [phone] with questions or concerns regarding your invoice.   IF you received labwork today, you will receive an invoice from [place]. Please contact [facility] at [phone] with questions or concerns regarding  your invoice.   Our billing staff will not be able to assist you with questions regarding bills from these companies.  You will be contacted with the lab results as soon as they are available. The fastest way to get your results is to activate your My Chart account. Instructions are located on the last page of this paperwork. If you have not heard from us regarding the results in 2 weeks, please contact  this office.       I personally performed the services described in this documentation, which was scribed in my presence. The recorded information has been reviewed and considered for accuracy and completeness, addended by me as needed, and agree with information above.  Signed,   Meredith StaggersJeffrey Greene, MD Primary [facility] at [facility].  10/21/18 9:11 PM

## 2018-10-20 NOTE — Telephone Encounter (Signed)
Work note sent via [employer] and also faxed as pt requested.

## 2018-10-20 NOTE — Telephone Encounter (Signed)
Pt called to state that she has had mild(1-3) lower left abdominal pain since Thursday the 21st. She denies fever. She states her abdomin is not tender. She is having normal BM's.  She has notice some frequency with urinating. No burning just urge to go and then goes very little.  She denies fever. Pt denies pregnancy states she is using an implanted birth control. She states she has had this pain in the past and usually starts her period but this time has lasted longer and no period. Appointment scheduled per protocol. Care advice read to patient. Pt verbalized understanding of all instructions.  Reason for Disposition . [1] MILD pain (e.g., does not interfere with normal activities) AND [2] pain comes and goes (cramps) AND [3] present > 48 hours  Answer Assessment - Initial Assessment Questions 1. LOCATION: "Where does it hurt?"      Left lower abdomin 2. RADIATION: "Does the pain shoot anywhere else?" (e.g., chest, back)     no 3. ONSET: "When did the pain begin?" (e.g., minutes, hours or days ago)      4 days ago 4. SUDDEN: "Gradual or sudden onset?"     sudden 5. PATTERN "Does the pain come and go, or is it constant?"    - If constant: "Is it getting better, staying the same, or worsening?"      (Note: Constant means the pain never goes away completely; most serious pain is constant and it progresses)     - If intermittent: "How long does it last?" "Do you have pain now?"     (Note: Intermittent means the pain goes away completely between bouts)     When she walks it hurts 6. SEVERITY: "How bad is the pain?"  (e.g., Scale 1-10; mild, moderate, or severe)   - MILD (1-3): doesn't interfere with normal activities, abdomen soft and not tender to touch    - MODERATE (4-7): interferes with normal activities or awakens from sleep, tender to touch    - SEVERE (8-10): excruciating pain, doubled over, unable to do any normal activities      1-3 7. RECURRENT SYMPTOM: "Have you ever had this type  of abdominal pain before?" If so, ask: "When was the last time?" and "What happened that time?"      Yes for a day or two prior to getting her period 8. CAUSE: "What do you think is causing the abdominal pain?"     Possibly her period getting ready to start but it has never lasted this long 9. RELIEVING/AGGRAVATING FACTORS: "What makes it better or worse?" (e.g., movement, antacids, bowel movement)     Movement,  10. OTHER SYMPTOMS: "Has there been any vomiting, diarrhea, constipation, or urine problems?"       No some urgency 11. PREGNANCY: "Is there any chance you are pregnant?" "When was your last menstrual period not pregnant nexplanon  Protocols used: [facility]OMINAL PAIN - [facility]

## 2018-10-20 NOTE — Telephone Encounter (Signed)
Copied from CRM [phone]. Topic: General - Other >> Oct 20, 2018  3:18 PM Leafy Roobinson, Norma J wrote: Reason for CRM: patient saw dr Neva Seatgreene and needs work note fax to [employer] [phone]. Pt was seen today and she will return to work today 10-20-18

## 2018-10-21 ENCOUNTER — Encounter: Payer: Self-pay | Admitting: Family Medicine

## 2018-10-22 LAB — URINE CULTURE

## 2018-11-05 ENCOUNTER — Encounter: Payer: Self-pay | Admitting: Family Medicine

## 2018-11-06 ENCOUNTER — Ambulatory Visit: Payer: Self-pay

## 2018-11-06 ENCOUNTER — Other Ambulatory Visit: Payer: Self-pay | Admitting: Family Medicine

## 2018-11-06 ENCOUNTER — Telehealth: Payer: Self-pay | Admitting: Family Medicine

## 2018-11-06 DIAGNOSIS — R3915 Urgency of urination: Secondary | ICD-10-CM

## 2018-11-06 DIAGNOSIS — N390 Urinary tract infection, site not specified: Secondary | ICD-10-CM

## 2018-11-06 DIAGNOSIS — R1032 Left lower quadrant pain: Secondary | ICD-10-CM

## 2018-11-06 NOTE — Telephone Encounter (Signed)
C/o pain in bladder region intermittently.  Denied current urgency or frequency. Stated the pain improved on Macrobid, but never resolved.  Denied fever/chills. Urinating in "normal" amts.  Cannot come into office today; leaving to go out of town after work.   Requesting an antibiotic be called in to pharmacy, and if needed, she can come to office when she returns; will be back late on the 18th.  Advised will send triage note to Dr. Neva SeatGreene.  Requesting abx. Be [facility] to [facility] [address].

## 2018-11-06 NOTE — Telephone Encounter (Signed)
  Reason for Disposition . Side (flank) or lower back pain present    Denied side or back pain; stated the pain is in the bladder region, intermittently.  Denied frequency or urgency of urination.  Recent abx. did not completely resolve the sx's.  Checked for UTI at home, and tested positive.  Requesting an abx. Since she is leaving town today, after work.  Answer Assessment - Initial Assessment Questions 1. SYMPTOM: "What's the main symptom you're concerned about?" (e.g., frequency, incontinence)     Pain in bladder intermittently 2. ONSET: "When did the  sx's  start?"     It never completely resolved from being on the antibiotic 3. PAIN: "Is there any pain?" If so, ask: "How bad is it?" (Scale: 1-10; mild, moderate, severe)     Moderate 4. CAUSE: "What do you think is causing the symptoms?"     Bladder infection 5. OTHER SYMPTOMS: "Do you have any other symptoms?" (e.g., fever, flank pain, blood in urine, pain with urination)     Bladder pain; denied any other symptoms. 6. PREGNANCY: "Is there any chance you are pregnant?" "When was your last menstrual period?"     Did not ask  Protocols used: URINARY [facility]

## 2018-11-06 NOTE — Telephone Encounter (Signed)
Copied from CRM [phone]. Topic: General - Inquiry >> Nov 06, 2018 11:48 AM Windy KalataMichael, Taylor L, NT wrote: Reason for CRM: patient is calling and states she is going out of town tonight and she would like to follow up on her triage message.

## 2018-11-07 DIAGNOSIS — R350 Frequency of micturition: Secondary | ICD-10-CM | POA: Diagnosis not present

## 2018-11-07 DIAGNOSIS — K5900 Constipation, unspecified: Secondary | ICD-10-CM | POA: Diagnosis not present

## 2018-11-07 DIAGNOSIS — R1032 Left lower quadrant pain: Secondary | ICD-10-CM | POA: Diagnosis not present

## 2018-11-07 NOTE — Telephone Encounter (Signed)
Requested medication (s) are due for refill today:  No    Requested medication (s) are on the active medication list:  yes  Future visit scheduled:  no  Last Refill: 10/20/18; #14; no refills See Triage note on 11/06/18 with details of pt's request and sx's.   Requested Prescriptions  Pending Prescriptions Disp Refills   nitrofurantoin, macrocrystal-monohydrate, (MACROBID) 100 MG capsule [Pharmacy Med Name: NITROFURANTOIN MONO-MCR 100 MG] 14 capsule 0    Sig: TAKE 1 CAPSULE BY MOUTH TWICE A DAY     Off-Protocol Failed - 11/06/2018  3:13 PM      Failed - Medication not assigned to a protocol, review manually.      Passed - Valid encounter within last 12 months    Recent Outpatient Visits          2 weeks ago LLQ abdominal pain   [facility], As[place]ion Part[place] R, [place]   9 months ago Retrosternal chest pain   [facility], [place], PA-C   10 months ago Pill esophagitis   [facility], [place], [place]   10 months ago Pill esophagitis   [facility], [place], PA-C   11 months ago Rash and nonspecific skin eruption   [facility], [place], [place]

## 2018-11-07 NOTE — Telephone Encounter (Signed)
FYI

## 2018-11-07 NOTE — Telephone Encounter (Signed)
Copied from CRM [id number]. Topic: General - Inquiry >> Nov 06, 2018 11:48 AM Windy KalataMichael, Taylor L, NT wrote: Reason for CRM: patient is calling and states she is going out of town tonight and she would like to follow up on her triage message. >> Nov 07, 2018 12:52 PM Mcneil, Jannifer RodneyJa-Kwan wrote: Pt called for an update on her request for an antibiotic. Pt would like a call back.

## 2018-11-08 NOTE — Telephone Encounter (Signed)
See last urine culture and lab note. Mixed flora - not typical of true UTI.    Recommended follow-up visit if symptoms were not improved.  If she did a home test that suggested possible UTI, I would recommend evaluation at care provider or urgent care locally where she has traveled to make sure she does not now have a true UTI as previous testing did not indicate one, and then could decide on possible culture to make sure she is on appropriate treatment. 

## 2018-11-08 NOTE — Telephone Encounter (Signed)
See other phone note.  Briefly her last urine culture did not indicate specific infection, and actually advised her to stop antibiotics.  Even though home test indicated possible UTI, I would recommend evaluation for true urinalysis, possible other infection testing and possible urine culture to direct therapy.  Thanks.

## 2018-11-08 NOTE — Telephone Encounter (Signed)
Pt message sent to Dr. Neva SeatGreene re: added antibiotics

## 2018-11-10 DIAGNOSIS — N949 Unspecified condition associated with female genital organs and menstrual cycle: Secondary | ICD-10-CM | POA: Diagnosis not present

## 2018-11-10 NOTE — Telephone Encounter (Signed)
Left message for patient to call back  crm in

## 2018-11-10 NOTE — Telephone Encounter (Signed)
See last urine culture and lab note. Mixed flora - not typical of true UTI.    Recommended follow-up visit if symptoms were not improved.  If she did a home test that suggested possible UTI, I would recommend evaluation at care provider or urgent care locally where she has traveled to make sure she does not now have a true UTI as previous testing did not indicate one, and then could decide on possible culture to make sure she is on appropriate treatment.

## 2018-12-11 ENCOUNTER — Other Ambulatory Visit (HOSPITAL_COMMUNITY): Payer: Self-pay | Admitting: Obstetrics and Gynecology

## 2018-12-11 ENCOUNTER — Ambulatory Visit (HOSPITAL_COMMUNITY)
Admission: RE | Admit: 2018-12-11 | Discharge: 2018-12-11 | Disposition: A | Discharge status: Home / Self Care | Payer: 59 | Source: Ambulatory Visit | Attending: Obstetrics and Gynecology | Admitting: Obstetrics and Gynecology

## 2018-12-11 DIAGNOSIS — R1032 Left lower quadrant pain: Secondary | ICD-10-CM | POA: Diagnosis present

## 2018-12-11 DIAGNOSIS — R110 Nausea: Secondary | ICD-10-CM

## 2018-12-11 DIAGNOSIS — N83292 Other ovarian cyst, left side: Secondary | ICD-10-CM | POA: Diagnosis not present

## 2018-12-12 ENCOUNTER — Other Ambulatory Visit: Payer: Self-pay | Admitting: Obstetrics and Gynecology

## 2018-12-16 ENCOUNTER — Telehealth: Payer: Self-pay | Admitting: Gastroenterology

## 2018-12-16 MED ORDER — ESOMEPRAZOLE MAGNESIUM 40 MG PO CPDR: 40.0000 mg | DELAYED_RELEASE_CAPSULE | Freq: Every day | ORAL | 3 refills | Status: DC

## 2018-12-16 NOTE — Telephone Encounter (Signed)
Pt called in and stated that she do not see a different with protonix and would like to switch back to esomeprazole (NEXIUM) 40 MG capsule [[id number]]  If at all possible.

## 2018-12-16 NOTE — Telephone Encounter (Signed)
Sent in Nexium for patient as requested

## 2018-12-17 DIAGNOSIS — N83292 Other ovarian cyst, left side: Secondary | ICD-10-CM | POA: Diagnosis not present

## 2018-12-18 NOTE — Telephone Encounter (Signed)
PT advised that we sent it a request for the incorrect med. She would like Omeprazole. JG

## 2018-12-19 MED ORDER — OMEPRAZOLE 40 MG PO CPDR: 40.0000 mg | DELAYED_RELEASE_CAPSULE | Freq: Every day | ORAL | 3 refills | Status: DC

## 2018-12-19 NOTE — Telephone Encounter (Signed)
Called patient and she stated insurance will not pay for Nexium wants omeprazole sent in to pharmacy   Sent omeprazole as requested by patient

## 2018-12-19 NOTE — Addendum Note (Signed)
Addended by: Marlowe Kays on: 12/19/2018 08:26 AM   Modules accepted: Orders

## 2018-12-21 ENCOUNTER — Emergency Department (HOSPITAL_COMMUNITY)
Admission: EM | Admit: 2018-12-21 | Discharge: 2018-12-21 | Disposition: A | Discharge status: Home / Self Care | Payer: 59 | Attending: Emergency Medicine | Admitting: Emergency Medicine

## 2018-12-21 ENCOUNTER — Encounter (HOSPITAL_COMMUNITY): Payer: Self-pay | Admitting: *Deleted

## 2018-12-21 ENCOUNTER — Emergency Department (HOSPITAL_COMMUNITY): Payer: 59

## 2018-12-21 ENCOUNTER — Emergency Department (HOSPITAL_BASED_OUTPATIENT_CLINIC_OR_DEPARTMENT_OTHER): Payer: 59

## 2018-12-21 ENCOUNTER — Other Ambulatory Visit: Payer: Self-pay

## 2018-12-21 DIAGNOSIS — R072 Precordial pain: Secondary | ICD-10-CM | POA: Diagnosis not present

## 2018-12-21 DIAGNOSIS — M79605 Pain in left leg: Secondary | ICD-10-CM | POA: Insufficient documentation

## 2018-12-21 DIAGNOSIS — R079 Chest pain, unspecified: Secondary | ICD-10-CM | POA: Diagnosis not present

## 2018-12-21 DIAGNOSIS — R609 Edema, unspecified: Secondary | ICD-10-CM

## 2018-12-21 DIAGNOSIS — R002 Palpitations: Secondary | ICD-10-CM | POA: Diagnosis not present

## 2018-12-21 DIAGNOSIS — M79662 Pain in left lower leg: Secondary | ICD-10-CM | POA: Diagnosis not present

## 2018-12-21 DIAGNOSIS — Z87891 Personal history of nicotine dependence: Secondary | ICD-10-CM | POA: Insufficient documentation

## 2018-12-21 DIAGNOSIS — Z79899 Other long term (current) drug therapy: Secondary | ICD-10-CM | POA: Insufficient documentation

## 2018-12-21 LAB — CBC
HCT: 40.5 % (ref 36.0–46.0)
Hemoglobin: 13.2 g/dL (ref 12.0–15.0)
MCH: 28.9 pg (ref 26.0–34.0)
MCHC: 32.6 g/dL (ref 30.0–36.0)
MCV: 88.6 fL (ref 80.0–100.0)
Platelets: 182 10*3/uL (ref 150–400)
RBC: 4.57 MIL/uL (ref 3.87–5.11)
RDW: 11.7 % (ref 11.5–15.5)
WBC: 7.5 10*3/uL (ref 4.0–10.5)
nRBC: 0 % (ref 0.0–0.2)

## 2018-12-21 LAB — POCT I-STAT TROPONIN I
Troponin i, poc: 0 ng/mL (ref 0.00–0.08)
Troponin i, poc: 0 ng/mL (ref 0.00–0.08)

## 2018-12-21 LAB — BASIC METABOLIC PANEL
Anion gap: 8 (ref 5–15)
BUN: 16 mg/dL (ref 6–20)
CO2: 21 mmol/L — ABNORMAL LOW (ref 22–32)
Calcium: 8.8 mg/dL — ABNORMAL LOW (ref 8.9–10.3)
Chloride: 109 mmol/L (ref 98–111)
Creatinine, Ser: 0.69 mg/dL (ref 0.44–1.00)
GFR calc Af Amer: >60 mL/min (ref >60)
GFR calc non Af Amer: >60 mL/min (ref >60)
Glucose, Bld: 109 mg/dL — ABNORMAL HIGH (ref 70–99)
Potassium: 3.5 mmol/L (ref 3.5–5.1)
Sodium: 138 mmol/L (ref 135–145)

## 2018-12-21 LAB — I-STAT BETA HCG BLOOD, ED (NOT ORDERABLE): I-stat hCG, quantitative: <5 m[IU]/mL (ref <5)

## 2018-12-21 LAB — D-DIMER, QUANTITATIVE: D-Dimer, Quant: 0.43 ug/mL-FEU (ref 0.00–0.50)

## 2018-12-21 MED ORDER — DICLOFENAC SODIUM 1 % TD GEL: 4.0000 g | Freq: Four times a day (QID) | TRANSDERMAL | 0 refills | Status: AC

## 2018-12-21 MED ORDER — SODIUM CHLORIDE 0.9% FLUSH: 3.0000 mL | Freq: Once | INTRAVENOUS | Status: DC

## 2018-12-21 NOTE — ED Triage Notes (Signed)
Pt c/o epigastric pain that is constant.  Also c/o left arm and LLE soreness.  Hx of PE & DVT.

## 2018-12-21 NOTE — ED Provider Notes (Signed)
Assumed care from Dr. Rhunette Croft at 8:39 AM. Briefly, the patient is a 33 y.o. female with PMHx of  has a past medical history of Anxiety, Depression, DVT (deep venous thrombosis) (HCC), Generalized headaches, GERD (gastroesophageal reflux disease), Hyperlipidemia, Migraine, and Ulcer. here with leg pain. H/o DVT/PE. U/S is pending.   Labs Reviewed  BASIC METABOLIC PANEL - Abnormal; Notable for the following components:      Result Value   CO2 21 (*)    Glucose, Bld 109 (*)    Calcium 8.8 (*)    All other components within normal limits  CBC  D-DIMER, QUANTITATIVE (NOT AT [facility])  I-STAT TROPONIN, ED  I-STAT BETA HCG BLOOD, ED ([facility], [facility], [facility] ONLY)  I-STAT BETA HCG BLOOD, ED (NOT ORDERABLE)  POCT I-STAT TROPONIN I  I-STAT TROPONIN, ED  POCT I-STAT TROPONIN I    Course of Care: -DVT study neg. Labs reviewed and are unremarkable. Pt with calf TTP on exam, likely MSK in etiology. H/o PUD so will avoid systemic NSAIDs, tx with tylenol, stretching, and topical voltaren.  Clinical Impression: 1. Palpitations   2. Precordial chest pain   3. Pain of left lower extremity     Disposition: Discharge  Condition: Good  I have discussed the results, Dx and Tx plan with the pt(& family if present). He/she/they expressed understanding and agree(s) with the plan. Discharge instructions discussed at great length. Strict return precautions discussed and pt &/or family have verbalized understanding of the instructions. No further questions at time of discharge.    New Prescriptions   No medications on file    Follow Up: [facility] [address] [place] [address] In 1 week   [facility] [id number] [place] [address] [phone]  If symptoms worsen       Shaune Pollack, MD 12/21/18 1014

## 2018-12-21 NOTE — Progress Notes (Signed)
Bilateral lower extremity venous duplex completed. Refer to "CV Proc" under chart review to view preliminary results.  Leta Jungling [facility] 12/21/2018  9:49

## 2018-12-21 NOTE — ED Notes (Signed)
Bed: WA25 Expected date:  Expected time:  Means of arrival:  Comments: 

## 2018-12-21 NOTE — ED Notes (Signed)
ED Provider at bedside. 

## 2018-12-22 DIAGNOSIS — Z01419 Encounter for gynecological examination (general) (routine) without abnormal findings: Secondary | ICD-10-CM | POA: Diagnosis not present

## 2018-12-22 DIAGNOSIS — Z6828 Body mass index (BMI) 28.0-28.9, adult: Secondary | ICD-10-CM | POA: Diagnosis not present

## 2018-12-23 DIAGNOSIS — M26629 Arthralgia of temporomandibular joint, unspecified side: Secondary | ICD-10-CM | POA: Diagnosis not present

## 2018-12-26 NOTE — ED Provider Notes (Signed)
[facility] Provider Note   CSN: [id number] Arrival date & time: 12/21/18  0307     History   Chief Complaint Chief Complaint  Patient presents with  . Chest Pain    HPI Thomes LollingJessica A Sica is a 33 y.o. female.  HPI 33 year old female comes in with chief complaint of chest pain. Patient has history of DVT, hypertension and hyperlipidemia. Patient reports that she has been having some episodes of palpitation with chest discomfort.  The chest pain is midsternal, and there is no associated shortness of breath.  On review of system also reports having some calf discomfort in her left lower extremity. She denies any heavy smoking, substance abuse, lung disease, CAD.   Past Medical History:  Diagnosis Date  . Anxiety   . Depression   . DVT (deep venous thrombosis) (HCC)   . Generalized headaches   . GERD (gastroesophageal reflux disease)   . Hyperlipidemia   . Migraine   . Ulcer     Patient Active Problem List   Diagnosis Date Noted  . Anxiety 09/16/2017  . History of DVT (deep vein thrombosis) 03/21/2017  . History of pulmonary embolism 03/21/2017  . Food allergy 11/08/2016  . Palpitations 02/27/2016  . Hemorrhoids 09/29/2015    Past Surgical History:  Procedure Laterality Date  . bunionectomy     . EYE SURGERY    . TONSILLECTOMY AND ADENOIDECTOMY       OB History   No obstetric history on file.      Home Medications    Prior to Admission medications   Medication Sig Start Date End Date Taking? Authorizing Provider  fluticasone (FLONASE) 50 MCG/ACT nasal spray SPRAY 2 SPRAYS INTO EACH NOSTRIL EVERY DAY Patient taking differently: Place 2 sprays into both nostrils daily as needed for allergies or rhinitis.  04/18/18  Yes Barnett AbuWiseman, [place] D, PA-C  Multiple Vitamins-Minerals (THERA-M) TABS Take 1 tablet by mouth daily.    Yes [provider]  omeprazole (PRILOSEC) 40 MG capsule Take 1 capsule (40 mg total) by mouth  daily. 12/19/18  Yes Nandigam, Eleonore ChiquitoKavitha V, MD  pantoprazole (PROTONIX) 40 MG tablet Take 40 mg by mouth daily.   Yes [provider]  diclofenac sodium (VOLTAREN) 1 % GEL Apply 4 g topically 4 (four) times daily for 7 days. 12/21/18 12/28/18  Shaune PollackIsaacs, Cameron, MD  nitrofurantoin, macrocrystal-monohydrate, (MACROBID) 100 MG capsule Take 1 capsule (100 mg total) by mouth 2 (two) times daily. Patient not taking: Reported on 12/21/2018 10/20/18   Shade FloodGreene, Jeffrey R, MD  phenazopyridine (PYRIDIUM) 100 MG tablet Take 1 tablet (100 mg total) by mouth 3 (three) times daily as needed for pain. Patient not taking: Reported on 12/21/2018 10/20/18   Shade FloodGreene, Jeffrey R, MD    Family History Family History  Problem Relation Age of Onset  . Hypertension Mother   . Hypertension Father   . Prostate cancer Father   . Prostate cancer Brother   . Diabetes Maternal Uncle   . Colon cancer Neg Hx   . Liver cancer Neg Hx     Social History Social History   Tobacco Use  . Smoking status: Former Smoker    Last attempt to quit: 2014    Years since quitting: 6.0  . Smokeless tobacco: Never Used  . Tobacco comment: used to smoke socially  Substance Use Topics  . Alcohol use: No    Alcohol/week: 0.0 standard drinks    Frequency: Never  . Drug use: No  Allergies   Esomeprazole magnesium; Cabbage; Mustard seed; Onion; Other; and Tomato   Review of Systems Review of Systems  Constitutional: Positive for activity change.  Respiratory: Positive for chest tightness.   Cardiovascular: Positive for chest pain.  Musculoskeletal: Positive for myalgias.  Hematological: Does not bruise/bleed easily.  All other systems reviewed and are negative.    Physical Exam Updated Vital Signs BP 118/84 (BP Location: Right Arm)   Pulse 84   Temp 98.4 F (36.9 C) (Oral)   Resp 16   Ht 5\' 5"  (1.651 m)   Wt 77.1 kg   LMP 12/16/2018 (Exact Date)   SpO2 99%   BMI 28.29 kg/m   Physical Exam Vitals signs and  nursing note reviewed.  Constitutional:      Appearance: She is well-developed.  HENT:     Head: Normocephalic and atraumatic.  Eyes:     Pupils: Pupils are equal, round, and reactive to light.  Neck:     Musculoskeletal: Neck supple.  Cardiovascular:     Rate and Rhythm: Normal rate and regular rhythm.     Heart sounds: Normal heart sounds. No murmur.  Pulmonary:     Effort: Pulmonary effort is normal. No respiratory distress.  Abdominal:     General: There is no distension.     Palpations: Abdomen is soft.     Tenderness: There is no abdominal tenderness. There is no guarding or rebound.  Musculoskeletal:     Left lower leg: She exhibits tenderness.  Skin:    General: Skin is warm and dry.  Neurological:     Mental Status: She is alert and oriented to person, place, and time.      ED Treatments / Results  Labs (all labs ordered are listed, but only abnormal results are displayed) Labs Reviewed  BASIC METABOLIC PANEL - Abnormal; Notable for the following components:      Result Value   CO2 21 (*)    Glucose, Bld 109 (*)    Calcium 8.8 (*)    All other components within normal limits  CBC  D-DIMER, QUANTITATIVE (NOT AT [facility])  I-STAT TROPONIN, ED  I-STAT BETA HCG BLOOD, ED ([facility], [facility], [facility] ONLY)  I-STAT BETA HCG BLOOD, ED (NOT ORDERABLE)  POCT I-STAT TROPONIN I  I-STAT TROPONIN, ED  POCT I-STAT TROPONIN I    EKG EKG Interpretation  Date/Time:  Sunday December 21 2018 03:15:41 EST Ventricular Rate:  91 PR Interval:    QRS Duration: 71 QT Interval:  344 QTC Calculation: 424 R Axis:   60 Text Interpretation:  Sinus rhythm Probable left atrial enlargement No acute changes No significant change since last tracing Confirmed by Derwood Kaplan [phone]) on 12/21/2018 5:40:20 AM Also confirmed by Derwood Kaplan [phone]), editor Sheppard Evens ([id number])  on 12/21/2018 11:32:23 AM   Radiology No results found.  Procedures Procedures (including critical care  time)  Medications Ordered in ED Medications - No data to display   Initial Impression / Assessment and Plan / ED Course  I have reviewed the triage vital signs and the nursing notes.  Pertinent labs & imaging results that were available during my care of the patient were reviewed by me and considered in my medical decision making (see chart for details).     Patient comes in a chief complaint of palpitations and chest discomfort.  She is also noted to have left lower extremity pain.  She has history of DVT and is not on any blood thinners.  Patient's pretest  probability on well score for PE is low.  D-dimer ordered. Her Wells score for DVT however is high, therefore we will get ultrasound DVT.  Troponins have been ordered as well, although concerns for ACS is low. EKG is reassuring.  Dr. Erma Heritage to follow-up on the pending results.  Final Clinical Impressions(s) / ED Diagnoses   Final diagnoses:  Palpitations  Precordial chest pain  Pain of left lower extremity    ED Discharge Orders         Ordered    diclofenac sodium (VOLTAREN) 1 % GEL  4 times daily     12/21/18 1015           Derwood Kaplan, MD 12/26/18 [phone]

## 2018-12-28 DIAGNOSIS — M542 Cervicalgia: Secondary | ICD-10-CM | POA: Diagnosis not present

## 2018-12-29 DIAGNOSIS — M62838 Other muscle spasm: Secondary | ICD-10-CM | POA: Diagnosis not present

## 2018-12-30 ENCOUNTER — Other Ambulatory Visit: Payer: Self-pay | Admitting: Family Medicine

## 2018-12-30 ENCOUNTER — Ambulatory Visit
Admission: RE | Admit: 2018-12-30 | Discharge: 2018-12-30 | Disposition: A | Discharge status: Home / Self Care | Payer: 59 | Source: Ambulatory Visit | Attending: Family Medicine | Admitting: Family Medicine

## 2018-12-30 DIAGNOSIS — M542 Cervicalgia: Secondary | ICD-10-CM

## 2019-01-08 DIAGNOSIS — M9901 Segmental and somatic dysfunction of cervical region: Secondary | ICD-10-CM | POA: Diagnosis not present

## 2019-01-08 DIAGNOSIS — M9902 Segmental and somatic dysfunction of thoracic region: Secondary | ICD-10-CM | POA: Diagnosis not present

## 2019-01-08 DIAGNOSIS — M5020 Other cervical disc displacement, unspecified cervical region: Secondary | ICD-10-CM | POA: Diagnosis not present

## 2019-01-19 ENCOUNTER — Ambulatory Visit: Payer: Self-pay | Admitting: Cardiology

## 2019-01-26 DIAGNOSIS — N83202 Unspecified ovarian cyst, left side: Secondary | ICD-10-CM | POA: Diagnosis not present

## 2019-01-27 DIAGNOSIS — J019 Acute sinusitis, unspecified: Secondary | ICD-10-CM | POA: Diagnosis not present

## 2019-01-29 DIAGNOSIS — G4452 New daily persistent headache (NDPH): Secondary | ICD-10-CM | POA: Diagnosis not present

## 2019-01-29 DIAGNOSIS — Z86711 Personal history of pulmonary embolism: Secondary | ICD-10-CM | POA: Diagnosis not present

## 2019-02-07 DIAGNOSIS — R51 Headache: Secondary | ICD-10-CM | POA: Diagnosis not present

## 2019-02-07 DIAGNOSIS — Z86711 Personal history of pulmonary embolism: Secondary | ICD-10-CM | POA: Diagnosis not present

## 2019-02-07 DIAGNOSIS — G4452 New daily persistent headache (NDPH): Secondary | ICD-10-CM | POA: Diagnosis not present

## 2019-04-07 DIAGNOSIS — R350 Frequency of micturition: Secondary | ICD-10-CM | POA: Diagnosis not present

## 2019-04-07 DIAGNOSIS — R1032 Left lower quadrant pain: Secondary | ICD-10-CM | POA: Diagnosis not present

## 2019-04-07 DIAGNOSIS — R1031 Right lower quadrant pain: Secondary | ICD-10-CM | POA: Diagnosis not present

## 2019-04-14 DIAGNOSIS — R102 Pelvic and perineal pain: Secondary | ICD-10-CM | POA: Diagnosis not present

## 2019-04-23 ENCOUNTER — Other Ambulatory Visit: Payer: Self-pay | Admitting: Physician Assistant

## 2019-04-23 DIAGNOSIS — R102 Pelvic and perineal pain: Secondary | ICD-10-CM

## 2019-04-27 ENCOUNTER — Ambulatory Visit
Admission: RE | Admit: 2019-04-27 | Discharge: 2019-04-27 | Disposition: A | Discharge status: Home / Self Care | Payer: 59 | Source: Ambulatory Visit | Attending: Physician Assistant | Admitting: Physician Assistant

## 2019-04-27 ENCOUNTER — Other Ambulatory Visit: Payer: Self-pay

## 2019-04-27 DIAGNOSIS — R1032 Left lower quadrant pain: Secondary | ICD-10-CM | POA: Diagnosis not present

## 2019-04-27 DIAGNOSIS — R102 Pelvic and perineal pain: Secondary | ICD-10-CM

## 2019-04-27 MED ORDER — IOPAMIDOL (ISOVUE-300) INJECTION 61%
100.0000 mL | Freq: Once | INTRAVENOUS | Status: AC | PRN
  Administered 2019-04-27: 100 mL via INTRAVENOUS

## 2019-04-30 DIAGNOSIS — M549 Dorsalgia, unspecified: Secondary | ICD-10-CM | POA: Diagnosis not present

## 2019-04-30 DIAGNOSIS — N760 Acute vaginitis: Secondary | ICD-10-CM | POA: Diagnosis not present

## 2019-04-30 DIAGNOSIS — R1032 Left lower quadrant pain: Secondary | ICD-10-CM | POA: Diagnosis not present

## 2019-05-04 ENCOUNTER — Other Ambulatory Visit: Payer: Self-pay

## 2019-05-04 ENCOUNTER — Encounter: Payer: Self-pay | Admitting: Gastroenterology

## 2019-05-04 ENCOUNTER — Ambulatory Visit (INDEPENDENT_AMBULATORY_CARE_PROVIDER_SITE_OTHER): Payer: 59 | Admitting: Gastroenterology

## 2019-05-04 VITALS — Ht 65.0 in | Wt 168.0 lb

## 2019-05-04 DIAGNOSIS — R1032 Left lower quadrant pain: Secondary | ICD-10-CM

## 2019-05-04 DIAGNOSIS — M7918 Myalgia, other site: Secondary | ICD-10-CM

## 2019-05-04 DIAGNOSIS — Z91018 Allergy to other foods: Secondary | ICD-10-CM | POA: Diagnosis not present

## 2019-05-04 DIAGNOSIS — M62838 Other muscle spasm: Secondary | ICD-10-CM | POA: Diagnosis not present

## 2019-05-04 DIAGNOSIS — K5904 Chronic idiopathic constipation: Secondary | ICD-10-CM | POA: Diagnosis not present

## 2019-05-04 NOTE — Patient Instructions (Addendum)
Start taking the muscle relaxant as prescribed by your PMD  Okay to use Tylenol 500 mg  /ibuprofen 200 mg up to 3 times daily along with meals as needed for next week  Increase dietary fiber and water intake to 8 to 10 cups daily  Start Benefiber 1 teaspoon 3 times daily with meals  Follow-up telemedicine visit in 2 to 4 weeks  I appreciate the  opportunity to care for you  Thank You   Harl Bowie , MD

## 2019-05-04 NOTE — Progress Notes (Signed)
Stacy Wilkins    [id number]    Jul 06, 1986  Primary Care Physician:Kaplan, Baldemar Friday., PA-C  Referring Physician: Aletha Halim., PA-C [address] [place] [address]  This service was provided via audio and video telemedicine (Doximity) due to [place] 19 pandemic.  Patient location: Home Provider location: Office Used 2 patient identifiers to confirm the correct person. Explained the limitations in evaluation and management via telemedicine. Patient is aware of potential medical charges for this visit.  Patient consented to this virtual visit.  The persons participating in this telemedicine service were myself and the patient   Chief complaint: Lower quadrant abdominal pain  HPI: 33 year old female with complaints of chronic left lower quadrant abdominal pain She has intermittent constipation.  Denies any association or improvement when she has regular bowel movement.  No association with activity.  She feels the pain is worse after she eats.  Vaginal ultrasound showed complex left ovarian cyst, is followed by GYN. No family history of endometriosis  No dysphagia, odynophagia, nausea, vomiting, melena or blood per rectum.  No weight loss  EGD 02/04/2018: Unremarkable, CLOtest negative  CT abdomen and pelvis with contrast April 27, 2019 1. No CT abnormality of the abdomen or pelvis to explain left flank pain or left lower quadrant pain.  2.  No evidence of urinary tract calculus or hydronephrosis.  3.  No significant diverticular disease.  Pelvic ultrasound December 11, 2018 4 cm complex cyst in the left ovary. Given left lower quadrant pain and possible thickened septation in the cyst, follow-up ultrasound in 6-12 weeks recommended to ensure resolution.  Outpatient Encounter Medications as of 05/04/2019  Medication Sig  . fluticasone (FLONASE) 50 MCG/ACT nasal spray SPRAY 2 SPRAYS INTO EACH NOSTRIL EVERY DAY (Patient taking differently:  Place 2 sprays into both nostrils daily as needed for allergies or rhinitis. )  . Multiple Vitamins-Minerals (THERA-M) TABS Take 1 tablet by mouth daily.   Marland Kitchen omeprazole (PRILOSEC) 40 MG capsule Take 1 capsule (40 mg total) by mouth daily.  . [DISCONTINUED] nitrofurantoin, macrocrystal-monohydrate, (MACROBID) 100 MG capsule Take 1 capsule (100 mg total) by mouth 2 (two) times daily. (Patient not taking: Reported on 05/04/2019)  . [DISCONTINUED] pantoprazole (PROTONIX) 40 MG tablet Take 40 mg by mouth daily.  . [DISCONTINUED] phenazopyridine (PYRIDIUM) 100 MG tablet Take 1 tablet (100 mg total) by mouth 3 (three) times daily as needed for pain. (Patient not taking: Reported on 05/04/2019)   Facility-Administered Encounter Medications as of 05/04/2019  Medication  . 0.9 %  sodium chloride infusion    Allergies as of 05/04/2019 - Review Complete 05/04/2019  Allergen Reaction Noted  . Esomeprazole magnesium Nausea And Vomiting 09/29/2015  . Cabbage Nausea And Vomiting 05/27/2017  . Mustard seed Nausea And Vomiting 05/27/2017  . Onion Nausea And Vomiting 05/27/2017  . Other Nausea And Vomiting 05/27/2017  . Tomato Nausea And Vomiting 05/27/2017    Past Medical History:  Diagnosis Date  . Anxiety   . Depression   . DVT (deep venous thrombosis) ([place])   . Generalized headaches   . GERD (gastroesophageal reflux disease)   . Hyperlipidemia   . Migraine   . Ulcer     Past Surgical History:  Procedure Laterality Date  . bunionectomy     . EYE SURGERY    . TONSILLECTOMY AND ADENOIDECTOMY      Family History  Problem Relation Age of Onset  . Hypertension Mother   .  Hypertension Father   . Prostate cancer Father   . Prostate cancer Brother   . Diabetes Maternal Uncle   . Colon cancer Neg Hx   . Liver cancer Neg Hx     Social History   Socioeconomic History  . Marital status: Single    Spouse name: Not on file  . Number of children: 0  . Years of education: Not on file  . Highest  education level: Not on file  Occupational History  . Occupation: Billing/Insurance    Comment: [employer]  Social Needs  . Financial resource strain: Not on file  . Food insecurity:    Worry: Not on file    Inability: Not on file  . Transportation needs:    Medical: Not on file    Non-medical: Not on file  Tobacco Use  . Smoking status: Former Smoker    Last attempt to quit: 2014    Years since quitting: 6.4  . Smokeless tobacco: Never Used  . Tobacco comment: used to smoke socially  Substance and Sexual Activity  . Alcohol use: No    Alcohol/week: 0.0 standard drinks    Frequency: Never  . Drug use: No  . Sexual activity: Not Currently    Birth control/protection: Implant  Lifestyle  . Physical activity:    Days per week: Not on file    Minutes per session: Not on file  . Stress: Not on file  Relationships  . Social connections:    Talks on phone: Not on file    Gets together: Not on file    Attends religious service: Not on file    Active member of club or organization: Not on file    Attends meetings of clubs or organizations: Not on file    Relationship status: Not on file  . Intimate partner violence:    Fear of current or ex partner: Not on file    Emotionally abused: Not on file    Physically abused: Not on file    Forced sexual activity: Not on file  Other Topics Concern  . Not on file  Social History Narrative  . Not on file      Review of systems: Review of Systems as per HPI All other systems reviewed and are negative.   Physical Exam: Vitals were not taken and physical exam was not performed during this virtual visit.  Data Reviewed:  Reviewed labs, radiology imaging, old records and pertinent past GI work up   Assessment and Plan/Recommendations:  33 year old female with history of GERD, hyperlipidemia, generalized anxiety and depression complains of left lower quadrant abdominal pain  Reviewed CT abdomen pelvis,  unremarkable no acute intra-abdominal pathology  Likely musculoskeletal, was prescribed muscle relaxant by her PMD, she has not started taking it yet.  Advised her to start taking muscle relaxant as prescribed for 7 to 10 days  Increase dietary fiber and water intake to improve constipation Benefiber 1 teaspoon 3 times daily with meals  Discussed with patient in detail that colonoscopy is likely going to be very low yield based on her symptoms and findings on imaging.  No colon thickening to suggest colitis or mass lesion was noted on CT  If continues to have persistent pain in left lower quadrant despite taking muscle relaxant and anti-inflammatory medication, can consider colonoscopy for further evaluation at that point  Follow-up in 2 to 4 weeks    K. Scherry RanVeena Nandigam , MD   CC: Stacy Wilkins, Stacy W., PA-C

## 2019-05-05 DIAGNOSIS — Z91018 Allergy to other foods: Secondary | ICD-10-CM | POA: Diagnosis not present

## 2019-05-06 ENCOUNTER — Encounter: Payer: Self-pay | Admitting: Gastroenterology

## 2019-05-22 ENCOUNTER — Telehealth: Payer: Self-pay | Admitting: Gastroenterology

## 2019-05-22 NOTE — Telephone Encounter (Signed)
Patient has followed the instructions given to her at the last visit. She continues to have a hard stool. Admits she does not like to drink water. Discussed other PO fluids she could try. Discussed what is the difference in CT and colonoscopy. The cost of the colonoscopy is a concern for her. Keep her appointment on 06/04/19 to discuss further. Continue with the recommendations.

## 2019-05-25 ENCOUNTER — Ambulatory Visit: Payer: Self-pay | Admitting: Cardiology

## 2019-05-25 NOTE — Progress Notes (Deleted)
Primary Physician:  Richmond CampbellKaplan, Kristen W., PA-C   Patient ID: Stacy Wilkins, female    DOB: [birth year], 33 y.o.   MRN: [id number]  Subjective:    No chief complaint on file.   HPI: Stacy Wilkins  is a 33 y.o. female  With history of left leg DVT and bilateral PE in April 2018 after foot surgery. She was last evaluated by us in July 2019 and monitor was placed; however, had skin irritation issues and was lost to follow up. Echocardiogram was also recommended; however, not performed.  Patient presents for follow-up for chest pain and worsening palpitations. Was evaluated in the emergency room in March 2019 for chest pain that was felt to be atypical, ACS workup was negative, d-dimer was negative, advised to follow up with GI. Reports she underwent endoscopy that was normal. Continues to have occasional chest tightness with certain movements. Denies any shortness of breath. She does occasionally walk on her lunch break without any exertional difficulty.  Reports for the last one to 2 months has been having heart racing that occurs mostly at night while lying in bed in can last for 1-2 hours. Has noted her heart rate to be as high as 140s to 150s. Not associated with any shortness of breath or syncope.   Past Medical History:  Diagnosis Date  . Anxiety   . Depression   . DVT (deep venous thrombosis) (HCC)   . Generalized headaches   . GERD (gastroesophageal reflux disease)   . Hyperlipidemia   . Migraine   . Ulcer     Past Surgical History:  Procedure Laterality Date  . bunionectomy     . EYE SURGERY    . TONSILLECTOMY AND ADENOIDECTOMY      Social History   Socioeconomic History  . Marital status: Single    Spouse name: Not on file  . Number of children: 0  . Years of education: Not on file  . Highest education level: Not on file  Occupational History  . Occupation: Billing/Insurance    Comment: [employer]  Social Needs  . Financial resource  strain: Not on file  . Food insecurity    Worry: Not on file    Inability: Not on file  . Transportation needs    Medical: Not on file    Non-medical: Not on file  Tobacco Use  . Smoking status: Former Smoker    Quit date: 2014    Years since quitting: 6.4  . Smokeless tobacco: Never Used  . Tobacco comment: used to smoke socially  Substance and Sexual Activity  . Alcohol use: No    Alcohol/week: 0.0 standard drinks    Frequency: Never  . Drug use: No  . Sexual activity: Not Currently    Birth control/protection: Implant  Lifestyle  . Physical activity    Days per week: Not on file    Minutes per session: Not on file  . Stress: Not on file  Relationships  . Social Musicianconnections    Talks on phone: Not on file    Gets together: Not on file    Attends religious service: Not on file    Active member of club or organization: Not on file    Attends meetings of clubs or organizations: Not on file    Relationship status: Not on file  . Intimate partner violence    Fear of current or ex partner: Not on file    Emotionally abused: Not on file  Physically abused: Not on file    Forced sexual activity: Not on file  Other Topics Concern  . Not on file  Social History Narrative  . Not on file    ROS    Objective:  There were no vitals taken for this visit. There is no height or weight on file to calculate BMI.    Physical Exam  Constitutional: She is oriented to person, place, and time. Vital signs are normal. She appears well-developed and well-nourished.  HENT:  Head: Normocephalic and atraumatic.  Neck: Normal range of motion.  Cardiovascular: Normal rate, regular rhythm, normal heart sounds and intact distal pulses.  Pulmonary/Chest: Effort normal and breath sounds normal. No accessory muscle usage. No respiratory distress.  Abdominal: Soft. Bowel sounds are normal.  Musculoskeletal: Normal range of motion.  Neurological: She is alert and oriented to person, place, and  time.  Skin: Skin is warm and dry.  Vitals reviewed.  Radiology: No results found.  Laboratory examination:   *** CMP Latest Ref Rng & Units 12/21/2018 02/01/2018 09/27/2017  Glucose 70 - 99 mg/dL 109(H) 88 85  BUN 6 - 20 mg/dL 16 11 13   Creatinine 0.44 - 1.00 mg/dL 0.69 0.84 0.80  Sodium 135 - 145 mmol/L 138 139 138  Potassium 3.5 - 5.1 mmol/L 3.5 3.6 4.7  Chloride 98 - 111 mmol/L 109 105 104  CO2 22 - 32 mmol/L 21(L) 23 22  Calcium 8.9 - 10.3 mg/dL 8.8(L) 9.5 9.4  Total Protein 6.0 - 8.5 g/dL - - 6.7  Total Bilirubin 0.0 - 1.2 mg/dL - - 0.6  Alkaline Phos 39 - 117 IU/L - - 58  AST 0 - 40 IU/L - - 14  ALT 0 - 32 IU/L - - 10   CBC Latest Ref Rng & Units 12/21/2018 02/01/2018 09/27/2017  WBC 4.0 - 10.5 K/uL 7.5 6.1 4.6  Hemoglobin 12.0 - 15.0 g/dL 13.2 15.2(H) 13.7  Hematocrit 36.0 - 46.0 % 40.5 44.6 40.6  Platelets 150 - 400 K/uL 182 190 175   Lipid Panel     Component Value Date/Time   CHOL 155 09/27/2017 0919   TRIG 47 09/27/2017 0919   HDL 40 09/27/2017 0919   CHOLHDL 3.9 09/27/2017 0919   CHOLHDL 4 09/29/2015 0936   VLDL 15.2 09/29/2015 0936   LDLCALC 106 (H) 09/27/2017 0919   HEMOGLOBIN A1C Lab Results  Component Value Date   HGBA1C 4.9 02/06/2016   TSH No results for input(s): TSH in the last 8760 hours.  PRN Meds:. There are no discontinued medications. No outpatient medications have been marked as taking for the 05/25/19 encounter (Appointment) with Miquel Dunn, NP.   Current Facility-Administered Medications for the 05/25/19 encounter (Appointment) with Miquel Dunn, NP  Medication  . 0.9 %  sodium chloride infusion    Cardiac Studies:   ***  Assessment:   No diagnosis found.  ***  Recommendations:   ***  Miquel Dunn, MSN, APRN, FNP-C [facility]. [place] Office: [phone] Fax: [phone]

## 2019-06-01 ENCOUNTER — Other Ambulatory Visit: Payer: Self-pay

## 2019-06-01 ENCOUNTER — Ambulatory Visit: Payer: BC Managed Care – PPO | Admitting: Cardiology

## 2019-06-01 ENCOUNTER — Encounter: Payer: Self-pay | Admitting: Cardiology

## 2019-06-01 VITALS — Ht 65.0 in | Wt 158.0 lb

## 2019-06-01 DIAGNOSIS — R002 Palpitations: Secondary | ICD-10-CM | POA: Diagnosis not present

## 2019-06-01 DIAGNOSIS — Z86711 Personal history of pulmonary embolism: Secondary | ICD-10-CM | POA: Diagnosis not present

## 2019-06-01 DIAGNOSIS — R0789 Other chest pain: Secondary | ICD-10-CM | POA: Diagnosis not present

## 2019-06-01 NOTE — Progress Notes (Signed)
Primary Physician:  Aletha Halim., PA-C   Patient ID: Stacy Wilkins, female    DOB: [birth year], 33 y.o.   MRN: [id number]  Subjective:    Chief Complaint  Patient presents with  . Palpitations  . Follow-up    HPI: Stacy Wilkins  is a 33 y.o. female  With history of left leg DVT and bilateral PE in April 2018 after foot surgery. She was last evaluated by [place] in July 2019 for heart racing and monitor was placed; however, had skin irritation issues and was lost to follow up.   She has history of atypical chest pain that she describes as tightness that has been present for several years and worsens with certain movements. Echocardiogram was performed in Nov 2019 that was normal.   Patient recently called our office for a appointment due to increased frequency of palpitations. Palpitations can generally last for several hours. Symptoms gradually resolve. Reports episode yesterday felt as though she may pass out. Describes them as heart racing. Symptoms are not daily but several times during the week. She does not have a apple watch to monitor. No associated shortness of breath or syncope.   Past Medical History:  Diagnosis Date  . Anxiety   . Depression   . DVT (deep venous thrombosis) ([place])   . Generalized headaches   . GERD (gastroesophageal reflux disease)   . Hyperlipidemia   . Migraine   . Ulcer     Past Surgical History:  Procedure Laterality Date  . bunionectomy     . EYE SURGERY    . TONSILLECTOMY AND ADENOIDECTOMY      Social History   Socioeconomic History  . Marital status: Single    Spouse name: Not on file  . Number of children: 0  . Years of education: Not on file  . Highest education level: Not on file  Occupational History  . Occupation: Billing/Insurance    Comment: Cytogeneticist  Social Needs  . Financial resource strain: Not on file  . Food insecurity    Worry: Not on file    Inability: Not on file  . Transportation  needs    Medical: Not on file    Non-medical: Not on file  Tobacco Use  . Smoking status: Former Smoker    Packs/day: 0.25    Years: 3.00    Pack years: 0.75    Quit date: 2014    Years since quitting: 6.5  . Smokeless tobacco: Never Used  . Tobacco comment: used to smoke socially  Substance and Sexual Activity  . Alcohol use: No    Alcohol/week: 0.0 standard drinks    Frequency: Never  . Drug use: No  . Sexual activity: Not Currently    Birth control/protection: Implant  Lifestyle  . Physical activity    Days per week: Not on file    Minutes per session: Not on file  . Stress: Not on file  Relationships  . Social Herbalist on phone: Not on file    Gets together: Not on file    Attends religious service: Not on file    Active member of club or organization: Not on file    Attends meetings of clubs or organizations: Not on file    Relationship status: Not on file  . Intimate partner violence    Fear of current or ex partner: Not on file    Emotionally abused: Not on file    Physically abused:  Not on file    Forced sexual activity: Not on file  Other Topics Concern  . Not on file  Social History Narrative  . Not on file    Review of Systems  Constitution: Negative for decreased appetite, malaise/fatigue, weight gain and weight loss.  Eyes: Negative for visual disturbance.  Cardiovascular: Positive for chest pain and palpitations. Negative for claudication, dyspnea on exertion, leg swelling, orthopnea and syncope.  Respiratory: Negative for hemoptysis and wheezing.   Endocrine: Negative for cold intolerance and heat intolerance.  Hematologic/Lymphatic: Does not bruise/bleed easily.  Skin: Negative for nail changes.  Musculoskeletal: Negative for muscle weakness and myalgias.  Gastrointestinal: Negative for abdominal pain, change in bowel habit, nausea and vomiting.  Neurological: Negative for difficulty with concentration, dizziness, focal weakness and  headaches.  Psychiatric/Behavioral: Negative for altered mental status and suicidal ideas.  All other systems reviewed and are negative.     Objective:  Height 5\' 5"  (1.651 m), weight 158 lb (71.7 kg). Body mass index is 26.29 kg/m.    Physical exam not performed or limited due to virtual visit.  Patient appeared to be in no distress, Neck was supple, respiration was not labored.  Please see exam details from prior visit is as below.   Physical Exam  Constitutional: She is oriented to person, place, and time. Vital signs are normal. She appears well-developed and well-nourished.  HENT:  Head: Normocephalic and atraumatic.  Neck: Normal range of motion.  Cardiovascular: Normal rate, regular rhythm, normal heart sounds and intact distal pulses.  Pulmonary/Chest: Effort normal and breath sounds normal. No accessory muscle usage. No respiratory distress.  Abdominal: Soft. Bowel sounds are normal.  Musculoskeletal: Normal range of motion.  Neurological: She is alert and oriented to person, place, and time.  Skin: Skin is warm and dry.  Vitals reviewed.  Radiology: No results found.  Laboratory examination:    CMP Latest Ref Rng & Units 12/21/2018 02/01/2018 09/27/2017  Glucose 70 - 99 mg/dL 161(W109(H) 88 85  BUN 6 - 20 mg/dL 16 11 13   Creatinine 0.44 - 1.00 mg/dL 9.600.69 4.540.84 0.980.80  Sodium 135 - 145 mmol/L 138 139 138  Potassium 3.5 - 5.1 mmol/L 3.5 3.6 4.7  Chloride 98 - 111 mmol/L 109 105 104  CO2 22 - 32 mmol/L 21(L) 23 22  Calcium 8.9 - 10.3 mg/dL 1.1(B8.8(L) 9.5 9.4  Total Protein 6.0 - 8.5 g/dL - - 6.7  Total Bilirubin 0.0 - 1.2 mg/dL - - 0.6  Alkaline Phos 39 - 117 IU/L - - 58  AST 0 - 40 IU/L - - 14  ALT 0 - 32 IU/L - - 10   CBC Latest Ref Rng & Units 12/21/2018 02/01/2018 09/27/2017  WBC 4.0 - 10.5 K/uL 7.5 6.1 4.6  Hemoglobin 12.0 - 15.0 g/dL 14.713.2 15.2(H) 13.7  Hematocrit 36.0 - 46.0 % 40.5 44.6 40.6  Platelets 150 - 400 K/uL 182 190 175   Lipid Panel     Component Value  Date/Time   CHOL 155 09/27/2017 0919   TRIG 47 09/27/2017 0919   HDL 40 09/27/2017 0919   CHOLHDL 3.9 09/27/2017 0919   CHOLHDL 4 09/29/2015 0936   VLDL 15.2 09/29/2015 0936   LDLCALC 106 (H) 09/27/2017 0919   HEMOGLOBIN A1C Lab Results  Component Value Date   HGBA1C 4.9 02/06/2016   TSH No results for input(s): TSH in the last 8760 hours.  PRN Meds:. There are no discontinued medications. Current Meds  Medication Sig  . fluticasone (  FLONASE) 50 MCG/ACT nasal spray SPRAY 2 SPRAYS INTO EACH NOSTRIL EVERY DAY (Patient taking differently: Place 2 sprays into both nostrils daily as needed for allergies or rhinitis. )  . Multiple Vitamins-Minerals (THERA-M) TABS Take 1 tablet by mouth daily.   Marland Kitchen. omeprazole (PRILOSEC) 40 MG capsule Take 1 capsule (40 mg total) by mouth daily.   Current Facility-Administered Medications for the 06/01/19 encounter (Office Visit) with Toniann FailKelley, Ashton Haynes, NP  Medication  . 0.9 %  sodium chloride infusion    Cardiac Studies:   Echocardiogram 10/06/2018: Left ventricle cavity is normal in size. Normal left ventricular shape. Normal global wall motion. Normal diastolic filling pattern. Calculated EF 58%. No hemodynamically significant valvular abnormality. Inadequate tricuspid regurgitation jet to estimate pulmonary artery pressure. Normal right atrial pressure.  Assessment:     ICD-10-CM   1. Palpitations  R00.2 TSH    TSH    Cardiac event monitor  2. Atypical chest pain  R07.89   3. History of pulmonary embolism  Z86.711    due to left leg DVT 02/2017    EKG 06/09/2018: Sinus rhythm at 92 bpm, normal axis, no evidence of ischemia. Normal EKG.  Recommendations:   Patient has had on and off episodes of palpitations for the last few years, previously was placed on holter monitor; however, had skin reaction and she was lost to follow up. She has recently had increased frequency heart racing. Her symptoms are not suggestive PSVT, but may be  related to PAC/PVC or inappropriate sinus tachycardia. I have recommended that she wear event monitor for further evaluation and will place with sensitive skin strips. Will place her on 7 day event monitor. I have encouraged her to obtain apple watch or smart watch if able to also help with monitoring of her heart rate. Echocardiogram is without structural abnormalities. She has not had recent labs, will obtain TSH for further evaluation.  She continues to have atypical chest pain likely related to musculoskeletal etiology. Do not suspect CAD. She has history of pulmonary embolism after developing left leg DVT in April 2018 after a foot surgery. No right heart strain noted. I will see her back after the monitor for follow up on palpitations.    Toniann FailAshton Haynes Kelley, MSN, APRN, FNP-C [facility] [place] Office: [phone] Fax: [phone]

## 2019-06-02 ENCOUNTER — Encounter: Payer: Self-pay | Admitting: Cardiology

## 2019-06-04 ENCOUNTER — Ambulatory Visit (INDEPENDENT_AMBULATORY_CARE_PROVIDER_SITE_OTHER): Payer: BC Managed Care – PPO | Admitting: Gastroenterology

## 2019-06-04 ENCOUNTER — Encounter: Payer: Self-pay | Admitting: Gastroenterology

## 2019-06-04 ENCOUNTER — Other Ambulatory Visit: Payer: Self-pay

## 2019-06-04 VITALS — Ht 65.0 in | Wt 158.0 lb

## 2019-06-04 DIAGNOSIS — R140 Abdominal distension (gaseous): Secondary | ICD-10-CM | POA: Diagnosis not present

## 2019-06-04 DIAGNOSIS — R1032 Left lower quadrant pain: Secondary | ICD-10-CM

## 2019-06-04 DIAGNOSIS — K581 Irritable bowel syndrome with constipation: Secondary | ICD-10-CM

## 2019-06-04 MED ORDER — DICYCLOMINE HCL 10 MG PO CAPS: ORAL_CAPSULE | ORAL | 3 refills | Status: DC

## 2019-06-04 NOTE — Patient Instructions (Signed)
Dicyclomine 10 mg every 6 hours as needed.  Please send refill for 3 months  Benefiber 1 teaspoon twice daily with meals  Increase water intake to 8 to 10 cups daily  Maintain IBS food symptom diary for next  4 to 6 weeks  Follow-up office visit in person in 4 to 6 weeks

## 2019-06-04 NOTE — Progress Notes (Signed)
Stacy Wilkins    [id number]    12/23/1985  Primary Care Physician:Kaplan, Baldemar Friday., PA-C  Referring Physician: Aletha Halim., PA-C [address],  [place] [address]  This service was provided via audio and video telemedicine (Doximity) due to [place] 19 pandemic.  Patient location: Home Provider location: Office Used 2 patient identifiers to confirm the correct person. Explained the limitations in evaluation and management via telemedicine. Patient is aware of potential medical charges for this visit.  Patient consented to this virtual visit.  The persons participating in this telemedicine service were myself and the patient  Chief complaint: Constipation, abdominal pain  HPI:  She continues to have left-sided abdominal discomfort in the lower abdomen.  Denies any association with diet or menstrual cycle.  Pain is sometimes worse when she does not have a bowel movement.  Denies any nausea, vomiting, blood per rectum, decreased appetite or weight loss.  Previous HPI June 41, 4726 33 year old female with complaints of chronic left lower quadrant abdominal pain She has intermittent constipation.  Denies any association or improvement when she has regular bowel movement.  No association with activity.  She feels the pain is worse after she eats.  Vaginal ultrasound showed complex left ovarian cyst, is followed by GYN. No family history of endometriosis  No dysphagia, odynophagia, nausea, vomiting, melena or blood per rectum.  No weight loss  EGD 02/04/2018: Unremarkable, CLOtest negative  CT abdomen and pelvis with contrast April 27, 2019 1. No CT abnormality of the abdomen or pelvis to explain left flank pain or left lower quadrant pain.  2. No evidence of urinary tract calculus or hydronephrosis.  3. No significant diverticular disease.  Pelvic ultrasound December 11, 2018 4 cm complex cyst in the left ovary. Given left lower  quadrant pain and possible thickened septation in the cyst, follow-up ultrasound in 6-12 weeks recommended to ensure resolution.    Outpatient Encounter Medications as of 06/04/2019  Medication Sig  . fluticasone (FLONASE) 50 MCG/ACT nasal spray SPRAY 2 SPRAYS INTO EACH NOSTRIL EVERY DAY (Patient taking differently: Place 2 sprays into both nostrils daily as needed for allergies or rhinitis. )  . Multiple Vitamins-Minerals (THERA-M) TABS Take 1 tablet by mouth daily.   Marland Kitchen omeprazole (PRILOSEC) 40 MG capsule Take 1 capsule (40 mg total) by mouth daily.   Facility-Administered Encounter Medications as of 06/04/2019  Medication  . 0.9 %  sodium chloride infusion    Allergies as of 06/04/2019 - Review Complete 06/04/2019  Allergen Reaction Noted  . Esomeprazole magnesium Nausea And Vomiting 09/29/2015  . Onion Nausea And Vomiting 05/27/2017  . Other Nausea And Vomiting 05/27/2017    Past Medical History:  Diagnosis Date  . Anxiety   . Depression   . DVT (deep venous thrombosis) ([place])   . Generalized headaches   . GERD (gastroesophageal reflux disease)   . Hyperlipidemia   . Migraine   . Ulcer     Past Surgical History:  Procedure Laterality Date  . bunionectomy     . EYE SURGERY    . TONSILLECTOMY AND ADENOIDECTOMY      Family History  Problem Relation Age of Onset  . Hypertension Mother   . Hypertension Father   . Prostate cancer Father   . Prostate cancer Brother   . Diabetes Maternal Uncle   . Colon cancer Neg Hx   . Liver cancer  Neg Hx     Social History   Socioeconomic History  . Marital status: Single    Spouse name: Not on file  . Number of children: 0  . Years of education: Not on file  . Highest education level: Not on file  Occupational History  . Occupation: Billing/Insurance    Comment: [employer]  Social Needs  . Financial resource strain: Not on file  . Food insecurity    Worry: Not on file    Inability: Not on file  .  Transportation needs    Medical: Not on file    Non-medical: Not on file  Tobacco Use  . Smoking status: Former Smoker    Packs/day: 0.25    Years: 3.00    Pack years: 0.75    Quit date: 2014    Years since quitting: 6.5  . Smokeless tobacco: Never Used  . Tobacco comment: used to smoke socially  Substance and Sexual Activity  . Alcohol use: No    Alcohol/week: 0.0 standard drinks    Frequency: Never  . Drug use: No  . Sexual activity: Not Currently    Birth control/protection: Implant  Lifestyle  . Physical activity    Days per week: Not on file    Minutes per session: Not on file  . Stress: Not on file  Relationships  . Social Musicianconnections    Talks on phone: Not on file    Gets together: Not on file    Attends religious service: Not on file    Active member of club or organization: Not on file    Attends meetings of clubs or organizations: Not on file    Relationship status: Not on file  . Intimate partner violence    Fear of current or ex partner: Not on file    Emotionally abused: Not on file    Physically abused: Not on file    Forced sexual activity: Not on file  Other Topics Concern  . Not on file  Social History Narrative  . Not on file      Review of systems: Review of Systems as per HPI All other systems reviewed and are negative.   Physical Exam: Vitals were not taken and physical exam was not performed during this virtual visit.  Data Reviewed:  Reviewed labs, radiology imaging, old records and pertinent past GI work up   Assessment and Plan/Recommendations:  33 year old female with complaints of left lower quadrant abdominal pain, bloating and constipation She has complex left ovarian cysts, work-up so far negative for endometriosis. Likely irritable bowel syndrome predominant constipation  Dicyclomine 10 mg every 6 hours as needed.    Benefiber 1 teaspoon twice daily with meals  Increase water intake to 8 to 10 cups daily  Maintain  IBS food symptom diary for next  4 to 6 weeks  Follow-up office visit in person in 4 to 6 weeks  If continues to have persistent left lower quadrant abdominal pain, will consider colonoscopy for further evaluation    K. Scherry RanVeena Nandigam , MD   CC: Richmond CampbellKaplan, Kristen W., PA-C

## 2019-06-05 ENCOUNTER — Telehealth: Payer: Self-pay | Admitting: Gastroenterology

## 2019-06-05 NOTE — Telephone Encounter (Signed)
Patient called she would like to know how long does it take for the med dicyclomine (BENTYL) 10 MG to kick in.

## 2019-06-05 NOTE — Telephone Encounter (Signed)
Informed patient its not a medication that builds in your system and is taken as needed. Patient states she has not seen any improvement in her symptoms since yesterday.  Informed patient to take it as prescribed and not as needed. Also to give the medication a little longer to help and to call back next week if she still does not see improvement in her symptoms. Pt agreed and verbalized understanding.

## 2019-07-08 ENCOUNTER — Ambulatory Visit: Payer: BC Managed Care – PPO | Admitting: Gastroenterology

## 2019-07-13 ENCOUNTER — Ambulatory Visit: Payer: BC Managed Care – PPO | Admitting: Cardiology

## 2019-07-25 ENCOUNTER — Telehealth: Payer: Self-pay | Admitting: Gastroenterology

## 2019-07-25 NOTE — Telephone Encounter (Signed)
Pt notes minor rectal bleeding, itching, discomfort and her main concern is a new swollen, bump on anus for a few days. She relates a history of hemorrhoids.  Advised sitz baths bid for several days, Prep H HC pr bid for several days, Recti-care pr bid for several days. Call office for appt or further advice from Dr. Silverio Decamp. If symptoms worsen or new symptoms ED or urgent care evaluation.

## 2019-08-10 ENCOUNTER — Ambulatory Visit: Payer: BC Managed Care – PPO | Admitting: Cardiology

## 2019-08-17 ENCOUNTER — Encounter

## 2019-08-17 ENCOUNTER — Ambulatory Visit: Payer: BC Managed Care – PPO | Admitting: Gastroenterology

## 2019-09-07 ENCOUNTER — Telehealth: Payer: Self-pay | Admitting: Gastroenterology

## 2019-09-07 MED ORDER — RABEPRAZOLE SODIUM 20 MG PO TBEC: 20.0000 mg | DELAYED_RELEASE_TABLET | Freq: Every day | ORAL | 3 refills | Status: DC

## 2019-09-07 NOTE — Telephone Encounter (Signed)
Pt reported that she has better insurance now and would like to be prescribed Aciphex again.

## 2019-09-07 NOTE — Telephone Encounter (Signed)
Sent Aciphex to patients pharmacy because its now covered under her new insurance Called pt to inform

## 2019-09-08 ENCOUNTER — Other Ambulatory Visit: Payer: Self-pay

## 2019-09-08 LAB — SARS CORONAVIRUS 2 (TAT 6-24 HRS): SARS Coronavirus 2: NEGATIVE

## 2019-09-14 DIAGNOSIS — J3489 Other specified disorders of nose and nasal sinuses: Secondary | ICD-10-CM | POA: Diagnosis not present

## 2019-09-14 DIAGNOSIS — H6690 Otitis media, unspecified, unspecified ear: Secondary | ICD-10-CM | POA: Diagnosis not present

## 2019-09-14 DIAGNOSIS — R519 Headache, unspecified: Secondary | ICD-10-CM | POA: Diagnosis not present

## 2019-09-16 ENCOUNTER — Ambulatory Visit: Payer: BC Managed Care – PPO

## 2019-09-16 ENCOUNTER — Telehealth: Payer: Self-pay

## 2019-09-16 ENCOUNTER — Encounter: Payer: Self-pay | Admitting: Cardiology

## 2019-09-16 ENCOUNTER — Other Ambulatory Visit: Payer: Self-pay

## 2019-09-16 ENCOUNTER — Ambulatory Visit: Payer: BC Managed Care – PPO | Admitting: Cardiology

## 2019-09-16 VITALS — BP 118/80 | HR 74 | Ht 65.0 in | Wt 158.0 lb

## 2019-09-16 DIAGNOSIS — R002 Palpitations: Secondary | ICD-10-CM | POA: Diagnosis not present

## 2019-09-16 MED ORDER — PROPRANOLOL HCL 10 MG PO TABS: 10.0000 mg | ORAL_TABLET | Freq: Four times a day (QID) | ORAL | 1 refills | Status: DC | PRN

## 2019-09-16 NOTE — Telephone Encounter (Signed)
Pt called to inform us that she is have trachy and would like o be seen today due that she has an appt on Friday. pt will make an appt for today with [facility].

## 2019-09-16 NOTE — Progress Notes (Signed)
Primary Physician:  Richmond Campbell., PA-C   Patient ID: Stacy Wilkins, female    DOB: 19-Aug-1986, 33 y.o.   MRN: [id number]  Subjective:    Chief Complaint  Patient presents with  . Palpitations    follow up    HPI: KYNSLEE Wilkins  is a 33 y.o. female  With history of left leg DVT and bilateral PE in April 2018 after foot surgery, she was on OCP which was discontinued. No recurrence.  She was last evaluated by [place] in July 2019 for heart racing and monitor was placed; however, had skin irritation issues and was lost to follow up.  Over the last 1 month, she has been having frequent episodes of palpitations, described as sudden onset of skipping, feels like her chest beats hard sometimes feels chest tightness, episodes occurring mostly at rest and lasting few seconds but can keep recurring.  It has been fairly frequent over the last 2 days.  She could not sleep well last night due to this.  She made an urgent appointment with [place].  No shortness of breath, no PND orthopnea, no leg edema.  No hemoptysis, dizziness or syncope.  She has had frequent episodes of GERD/heartburn and has been on AcipHex on a daily basis.  Past Medical History:  Diagnosis Date  . Anxiety   . Depression   . DVT (deep venous thrombosis) (HCC)   . Generalized headaches   . GERD (gastroesophageal reflux disease)   . Hyperlipidemia   . Migraine   . Ulcer     Past Surgical History:  Procedure Laterality Date  . bunionectomy     . EYE SURGERY    . TONSILLECTOMY AND ADENOIDECTOMY      Social History   Socioeconomic History  . Marital status: Single    Spouse name: Not on file  . Number of children: 0  . Years of education: Not on file  . Highest education level: Not on file  Occupational History  . Occupation: Billing/Insurance    Comment: Nutritional therapist  Social Needs  . Financial resource strain: Not on file  . Food insecurity    Worry: Not on file    Inability: Not on file   . Transportation needs    Medical: Not on file    Non-medical: Not on file  Tobacco Use  . Smoking status: Former Smoker    Packs/day: 0.25    Years: 3.00    Pack years: 0.75    Quit date: 2014    Years since quitting: 6.8  . Smokeless tobacco: Never Used  . Tobacco comment: used to smoke socially  Substance and Sexual Activity  . Alcohol use: No    Alcohol/week: 0.0 standard drinks    Frequency: Never  . Drug use: No  . Sexual activity: Not Currently    Birth control/protection: Implant  Lifestyle  . Physical activity    Days per week: Not on file    Minutes per session: Not on file  . Stress: Not on file  Relationships  . Social Musician on phone: Not on file    Gets together: Not on file    Attends religious service: Not on file    Active member of club or organization: Not on file    Attends meetings of clubs or organizations: Not on file    Relationship status: Not on file  . Intimate partner violence    Fear of current or ex partner:  Not on file    Emotionally abused: Not on file    Physically abused: Not on file    Forced sexual activity: Not on file  Other Topics Concern  . Not on file  Social History Narrative  . Not on file   Review of Systems  Constitution: Negative for decreased appetite, malaise/fatigue, weight gain and weight loss.  Eyes: Negative for visual disturbance.  Cardiovascular: Positive for chest pain and palpitations. Negative for claudication, dyspnea on exertion, leg swelling, orthopnea and syncope.  Respiratory: Negative for hemoptysis and wheezing.   Endocrine: Negative for cold intolerance and heat intolerance.  Hematologic/Lymphatic: Does not bruise/bleed easily.  Skin: Negative for nail changes.  Musculoskeletal: Negative for muscle weakness and myalgias.  Gastrointestinal: Positive for heartburn. Negative for abdominal pain, change in bowel habit, nausea and vomiting.  Neurological: Negative for difficulty with  concentration, dizziness, focal weakness and headaches.  Psychiatric/Behavioral: Negative for altered mental status and suicidal ideas.  All other systems reviewed and are negative.  Objective:  Blood pressure 118/80, pulse 74, height 5\' 5"  (1.651 m), weight 158 lb (71.7 kg), SpO2 98 %. Body mass index is 26.29 kg/m.    Physical Exam  Constitutional: She is oriented to person, place, and time. Vital signs are normal. She appears well-developed and well-nourished.  HENT:  Head: Normocephalic and atraumatic.  Neck: Normal range of motion.  Cardiovascular: Normal rate, regular rhythm, normal heart sounds and intact distal pulses.  Pulmonary/Chest: Effort normal and breath sounds normal. No accessory muscle usage. No respiratory distress.  Abdominal: Soft. Bowel sounds are normal.  Musculoskeletal: Normal range of motion.  Neurological: She is alert and oriented to person, place, and time.  Skin: Skin is warm and dry.  Vitals reviewed.  Radiology: No results found.  Laboratory examination:    CMP Latest Ref Rng & Units 12/21/2018 02/01/2018 09/27/2017  Glucose 70 - 99 mg/dL 109(H) 88 85  BUN 6 - 20 mg/dL 16 11 13   Creatinine 0.44 - 1.00 mg/dL 0.69 0.84 0.80  Sodium 135 - 145 mmol/L 138 139 138  Potassium 3.5 - 5.1 mmol/L 3.5 3.6 4.7  Chloride 98 - 111 mmol/L 109 105 104  CO2 22 - 32 mmol/L 21(L) 23 22  Calcium 8.9 - 10.3 mg/dL 8.8(L) 9.5 9.4  Total Protein 6.0 - 8.5 g/dL - - 6.7  Total Bilirubin 0.0 - 1.2 mg/dL - - 0.6  Alkaline Phos 39 - 117 IU/L - - 58  AST 0 - 40 IU/L - - 14  ALT 0 - 32 IU/L - - 10   CBC Latest Ref Rng & Units 12/21/2018 02/01/2018 09/27/2017  WBC 4.0 - 10.5 K/uL 7.5 6.1 4.6  Hemoglobin 12.0 - 15.0 g/dL 13.2 15.2(H) 13.7  Hematocrit 36.0 - 46.0 % 40.5 44.6 40.6  Platelets 150 - 400 K/uL 182 190 175   Lipid Panel     Component Value Date/Time   CHOL 155 09/27/2017 0919   TRIG 47 09/27/2017 0919   HDL 40 09/27/2017 0919   CHOLHDL 3.9 09/27/2017 0919    CHOLHDL 4 09/29/2015 0936   VLDL 15.2 09/29/2015 0936   LDLCALC 106 (H) 09/27/2017 0919   HEMOGLOBIN A1C Lab Results  Component Value Date   HGBA1C 4.9 02/06/2016   TSH No results for input(s): TSH in the last 8760 hours.  PRN Meds:. There are no discontinued medications. Current Meds  Medication Sig  . amoxicillin-clavulanate (AUGMENTIN) 875-125 MG tablet Take 1 tablet by mouth 2 (two) times daily.  Marland Kitchen  fluticasone (FLONASE) 50 MCG/ACT nasal spray SPRAY 2 SPRAYS INTO EACH NOSTRIL EVERY DAY (Patient taking differently: Place 2 sprays into both nostrils daily as needed for allergies or rhinitis. )  . Multiple Vitamins-Minerals (THERA-M) TABS Take 1 tablet by mouth daily.   . RABEprazole (ACIPHEX) 20 MG tablet Take 1 tablet (20 mg total) by mouth daily.  . valACYclovir (VALTREX) 500 MG tablet Take 1 tablet by mouth daily.   Current Facility-Administered Medications for the 09/16/19 encounter (Office Visit) with Yates DecampGanji, Jay, MD  Medication  . 0.9 %  sodium chloride infusion    Cardiac Studies:   Echocardiogram 10/06/2018: Left ventricle cavity is normal in size. Normal left ventricular shape. Normal global wall motion. Normal diastolic filling pattern. Calculated EF 58%. No hemodynamically significant valvular abnormality. Inadequate tricuspid regurgitation jet to estimate pulmonary artery pressure. Normal right atrial pressure.  Assessment:     ICD-10-CM   1. Palpitations  R00.2 EKG 12-Lead    TSH    propranolol (INDERAL) 10 MG tablet   EKG 09/16/2019: Normal sinus rhythm at rate of 5 bpm, normal axis.  No evidence of ischemia, normal EKG. No significant change from  EKG 06/09/2018   Recommendations:   Patient was last evaluated by us for palpitations 3-4 months ago for palpitations, was recommended event monitor but she was unable to address due to severe rash.  She continues to have frequent episodes of palpitations especially worse over the past one month, and last 2 days  she has not been able to sleep well.  Symptoms are most indicated to of PACs and PVCs and I do not suspect any significant arrhythmias.  However due to patient's severe symptoms, we will perform an event monitor for 1 week.  I'll also obtain a TSH.  Palpitations could also be related to severe GERD.  She has been taking AcipHex on a daily basis.  Advised her to discuss with Dr. Lavon PaganiniNandigam regarding continued and persistent and worsening symptoms.  This may also be contributing to her palpitations.  For now I prescribed her propranolol 10 mg q.i.d. to be taken on a p.r.n. basis.  I'd like to see her back in one month.  CC: Dr. Lavon PaganiniNandigam low incidence  Yates DecampJay Ganji, MD, [facility] CenterFACC 09/16/2019, 10:46 AM [facility] Pager: [phone] Office: [phone] If no answer Cell [phone]

## 2019-09-18 ENCOUNTER — Ambulatory Visit: Payer: BC Managed Care – PPO | Admitting: Cardiology

## 2019-09-18 LAB — TSH: TSH: 1.8 u[IU]/mL (ref 0.450–4.500)

## 2019-09-21 DIAGNOSIS — M5481 Occipital neuralgia: Secondary | ICD-10-CM | POA: Diagnosis not present

## 2019-09-21 DIAGNOSIS — M9902 Segmental and somatic dysfunction of thoracic region: Secondary | ICD-10-CM | POA: Diagnosis not present

## 2019-09-21 DIAGNOSIS — M62838 Other muscle spasm: Secondary | ICD-10-CM | POA: Diagnosis not present

## 2019-09-21 DIAGNOSIS — M9901 Segmental and somatic dysfunction of cervical region: Secondary | ICD-10-CM | POA: Diagnosis not present

## 2019-09-21 DIAGNOSIS — M542 Cervicalgia: Secondary | ICD-10-CM | POA: Diagnosis not present

## 2019-09-23 DIAGNOSIS — G43009 Migraine without aura, not intractable, without status migrainosus: Secondary | ICD-10-CM | POA: Diagnosis not present

## 2019-09-23 DIAGNOSIS — M5481 Occipital neuralgia: Secondary | ICD-10-CM | POA: Diagnosis not present

## 2019-09-24 DIAGNOSIS — M9902 Segmental and somatic dysfunction of thoracic region: Secondary | ICD-10-CM | POA: Diagnosis not present

## 2019-09-24 DIAGNOSIS — M9901 Segmental and somatic dysfunction of cervical region: Secondary | ICD-10-CM | POA: Diagnosis not present

## 2019-09-24 DIAGNOSIS — M542 Cervicalgia: Secondary | ICD-10-CM | POA: Diagnosis not present

## 2019-09-24 DIAGNOSIS — M5481 Occipital neuralgia: Secondary | ICD-10-CM | POA: Diagnosis not present

## 2019-09-25 ENCOUNTER — Ambulatory Visit: Payer: BC Managed Care – PPO | Admitting: Physician Assistant

## 2019-09-29 DIAGNOSIS — M542 Cervicalgia: Secondary | ICD-10-CM | POA: Diagnosis not present

## 2019-09-29 DIAGNOSIS — M9902 Segmental and somatic dysfunction of thoracic region: Secondary | ICD-10-CM | POA: Diagnosis not present

## 2019-09-29 DIAGNOSIS — M9901 Segmental and somatic dysfunction of cervical region: Secondary | ICD-10-CM | POA: Diagnosis not present

## 2019-09-29 DIAGNOSIS — M5481 Occipital neuralgia: Secondary | ICD-10-CM | POA: Diagnosis not present

## 2019-10-05 DIAGNOSIS — R519 Headache, unspecified: Secondary | ICD-10-CM | POA: Diagnosis not present

## 2019-10-08 ENCOUNTER — Other Ambulatory Visit: Payer: Self-pay

## 2019-10-08 ENCOUNTER — Telehealth: Payer: BC Managed Care – PPO | Admitting: Cardiology

## 2019-10-08 VITALS — Ht 65.0 in | Wt 158.0 lb

## 2019-10-08 DIAGNOSIS — R002 Palpitations: Secondary | ICD-10-CM | POA: Diagnosis not present

## 2019-10-08 NOTE — Progress Notes (Signed)
Primary Physician:  Aletha Halim., PA-C   Patient ID: Stacy Wilkins, female    DOB: [birth year], 33 y.o.   MRN: [id number]  Subjective:    Chief Complaint  Patient presents with  . Palpitations  . Follow-up    4 week  . Results    lab and montior    HPI: Stacy Wilkins  is a 33 y.o. female  With history of left leg DVT and bilateral PE in April 2018 after foot surgery, she was on OCP which was discontinued. No recurrence.  Recently seen for acute visit for palpitations. She was placed on 7 day event monitor and now presents for follow up. Started on Propanolol at her last visit.   Since her last visit, she has been doing much better.  She felt that the neck pain that she was having at the time of her last appointment caused her palpitations and chest pain.  She has had complete resolution of chest pain.  Palpitations have significantly improved.  She is only using propanolol on occasion that does improve her symptoms.  She does admit that she has issues with anxiety, which she is not currently being treated for.   No shortness of breath, no PND orthopnea, no leg edema.  No hemoptysis, dizziness or syncope.  She has had frequent episodes of GERD/heartburn and has been on AcipHex on a daily basis.  Past Medical History:  Diagnosis Date  . Anxiety   . Depression   . DVT (deep venous thrombosis) ([place])   . Generalized headaches   . GERD (gastroesophageal reflux disease)   . Hyperlipidemia   . Migraine   . Ulcer     Past Surgical History:  Procedure Laterality Date  . bunionectomy     . EYE SURGERY    . TONSILLECTOMY AND ADENOIDECTOMY      Social History   Socioeconomic History  . Marital status: Single    Spouse name: Not on file  . Number of children: 0  . Years of education: Not on file  . Highest education level: Not on file  Occupational History  . Occupation: Billing/Insurance    Comment: Cytogeneticist  Social Needs  . Financial  resource strain: Not on file  . Food insecurity    Worry: Not on file    Inability: Not on file  . Transportation needs    Medical: Not on file    Non-medical: Not on file  Tobacco Use  . Smoking status: Former Smoker    Packs/day: 0.25    Years: 3.00    Pack years: 0.75    Quit date: 2014    Years since quitting: 6.8  . Smokeless tobacco: Never Used  . Tobacco comment: used to smoke socially  Substance and Sexual Activity  . Alcohol use: No    Alcohol/week: 0.0 standard drinks    Frequency: Never  . Drug use: No  . Sexual activity: Not Currently    Birth control/protection: Implant  Lifestyle  . Physical activity    Days per week: Not on file    Minutes per session: Not on file  . Stress: Not on file  Relationships  . Social Herbalist on phone: Not on file    Gets together: Not on file    Attends religious service: Not on file    Active member of club or organization: Not on file    Attends meetings of clubs or organizations: Not on file  Relationship status: Not on file  . Intimate partner violence    Fear of current or ex partner: Not on file    Emotionally abused: Not on file    Physically abused: Not on file    Forced sexual activity: Not on file  Other Topics Concern  . Not on file  Social History Narrative  . Not on file   Review of Systems  Constitution: Negative for decreased appetite, malaise/fatigue, weight gain and weight loss.  Eyes: Negative for visual disturbance.  Cardiovascular: Positive for palpitations (improved). Negative for chest pain, claudication, dyspnea on exertion, leg swelling, orthopnea and syncope.  Respiratory: Negative for hemoptysis and wheezing.   Endocrine: Negative for cold intolerance and heat intolerance.  Hematologic/Lymphatic: Does not bruise/bleed easily.  Skin: Negative for nail changes.  Musculoskeletal: Negative for muscle weakness and myalgias.  Gastrointestinal: Positive for heartburn. Negative for  abdominal pain, change in bowel habit, nausea and vomiting.  Neurological: Negative for difficulty with concentration, dizziness, focal weakness and headaches.  Psychiatric/Behavioral: Negative for altered mental status and suicidal ideas.  All other systems reviewed and are negative.  Objective:  Height 5\' 5"  (1.651 m), weight 158 lb (71.7 kg). Body mass index is 26.29 kg/m.    Physical Exam  Constitutional: She is oriented to person, place, and time. Vital signs are normal. She appears well-developed and well-nourished.  HENT:  Head: Normocephalic and atraumatic.  Neck: Normal range of motion.  Cardiovascular: Normal rate, regular rhythm, normal heart sounds and intact distal pulses.  Pulmonary/Chest: Effort normal and breath sounds normal. No accessory muscle usage. No respiratory distress.  Abdominal: Soft. Bowel sounds are normal.  Musculoskeletal: Normal range of motion.  Neurological: She is alert and oriented to person, place, and time.  Skin: Skin is warm and dry.  Vitals reviewed.  Radiology: No results found.  Laboratory examination:    CMP Latest Ref Rng & Units 12/21/2018 02/01/2018 09/27/2017  Glucose 70 - 99 mg/dL 161(W109(H) 88 85  BUN 6 - 20 mg/dL 16 11 13   Creatinine 0.44 - 1.00 mg/dL 9.600.69 4.540.84 0.980.80  Sodium 135 - 145 mmol/L 138 139 138  Potassium 3.5 - 5.1 mmol/L 3.5 3.6 4.7  Chloride 98 - 111 mmol/L 109 105 104  CO2 22 - 32 mmol/L 21(L) 23 22  Calcium 8.9 - 10.3 mg/dL 1.1(B8.8(L) 9.5 9.4  Total Protein 6.0 - 8.5 g/dL - - 6.7  Total Bilirubin 0.0 - 1.2 mg/dL - - 0.6  Alkaline Phos 39 - 117 IU/L - - 58  AST 0 - 40 IU/L - - 14  ALT 0 - 32 IU/L - - 10   CBC Latest Ref Rng & Units 12/21/2018 02/01/2018 09/27/2017  WBC 4.0 - 10.5 K/uL 7.5 6.1 4.6  Hemoglobin 12.0 - 15.0 g/dL 14.713.2 15.2(H) 13.7  Hematocrit 36.0 - 46.0 % 40.5 44.6 40.6  Platelets 150 - 400 K/uL 182 190 175   Lipid Panel     Component Value Date/Time   CHOL 155 09/27/2017 0919   TRIG 47 09/27/2017 0919    HDL 40 09/27/2017 0919   CHOLHDL 3.9 09/27/2017 0919   CHOLHDL 4 09/29/2015 0936   VLDL 15.2 09/29/2015 0936   LDLCALC 106 (H) 09/27/2017 0919   HEMOGLOBIN A1C Lab Results  Component Value Date   HGBA1C 4.9 02/06/2016   TSH Recent Labs    09/16/19 1115  TSH 1.800    PRN Meds:. Medications Discontinued During This Encounter  Medication Reason  . dicyclomine (BENTYL) 10 MG capsule  Error   Current Meds  Medication Sig  . fluticasone (FLONASE) 50 MCG/ACT nasal spray SPRAY 2 SPRAYS INTO EACH NOSTRIL EVERY DAY (Patient taking differently: Place 2 sprays into both nostrils daily as needed for allergies or rhinitis. )  . Multiple Vitamins-Minerals (THERA-M) TABS Take 1 tablet by mouth daily.   . propranolol (INDERAL) 10 MG tablet Take 1 tablet (10 mg total) by mouth 4 (four) times daily as needed (Palpitations).  . RABEprazole (ACIPHEX) 20 MG tablet Take 1 tablet (20 mg total) by mouth daily.  . valACYclovir (VALTREX) 500 MG tablet Take 1 tablet by mouth daily.   Current Facility-Administered Medications for the 10/08/19 encounter (Telemedicine) with Toniann Fail, NP  Medication  . 0.9 %  sodium chloride infusion    Cardiac Studies:   Event Monitor for 7 days Start date 09/16/2019: [name] monitoring period was 09/16/2019 - 09/22/2019. Baseline sample showed Sinus Rhythm with a heart rate of 78.6 bpm. There were 0 critical, 0 serious, and 3 stable events that occurred. The report analysis of the critical, serious, stable and manually triggered events reveal NSR. There were rare PACs and PVCs.   Echocardiogram 10/06/2018: Left ventricle cavity is normal in size. Normal left ventricular shape. Normal global wall motion. Normal diastolic filling pattern. Calculated EF 58%. No hemodynamically significant valvular abnormality. Inadequate tricuspid regurgitation jet to estimate pulmonary artery pressure. Normal right atrial pressure.  Assessment:     ICD-10-CM   1.  Palpitations  R00.2    EKG 09/16/2019: Normal sinus rhythm at rate of 85 bpm, normal axis.  No evidence of ischemia, normal EKG. No significant change from  EKG 06/09/2018   Recommendations:   Patient does have improvement in palpitations, felt that her episode was likely related to anxiety neck pain that she was experiencing at the time of her acute visit.  She has had the use of propranolol occasionally that does help her symptoms.  I discussed recently obtained event monitor that showed normal sinus rhythm with rare PAC and PVC.  I have reassured her.  Advised her that she is continue with B6 and B12 vitamins to help with her PVCs, work on controlling her stress, and decrease her caffeine intake.  I have encouraged her to consider evaluation and management of her anxiety and stress testing to be contributing to a lot of her symptoms.  She is agreeable and will consider this.  As her symptoms have improved and she is overall been stable.  We will see her back on an as-needed basis.  Toniann Fail, MSN, APRN, FNP-C [facility] Office: [phone] Fax: [phone]

## 2019-10-09 ENCOUNTER — Ambulatory Visit: Payer: BC Managed Care – PPO | Admitting: Cardiology

## 2019-10-12 ENCOUNTER — Other Ambulatory Visit: Payer: Self-pay | Admitting: Otolaryngology

## 2019-10-12 DIAGNOSIS — R519 Headache, unspecified: Secondary | ICD-10-CM

## 2019-10-14 ENCOUNTER — Other Ambulatory Visit: Payer: Self-pay

## 2019-10-14 ENCOUNTER — Emergency Department (HOSPITAL_COMMUNITY)
Admission: EM | Admit: 2019-10-14 | Discharge: 2019-10-14 | Disposition: A | Discharge status: Home / Self Care | Payer: BC Managed Care – PPO | Attending: Emergency Medicine | Admitting: Emergency Medicine

## 2019-10-14 ENCOUNTER — Encounter (HOSPITAL_COMMUNITY): Payer: Self-pay

## 2019-10-14 ENCOUNTER — Emergency Department (HOSPITAL_COMMUNITY): Payer: BC Managed Care – PPO

## 2019-10-14 DIAGNOSIS — M542 Cervicalgia: Secondary | ICD-10-CM | POA: Diagnosis not present

## 2019-10-14 DIAGNOSIS — Z79899 Other long term (current) drug therapy: Secondary | ICD-10-CM | POA: Diagnosis not present

## 2019-10-14 DIAGNOSIS — Z87891 Personal history of nicotine dependence: Secondary | ICD-10-CM | POA: Diagnosis not present

## 2019-10-14 DIAGNOSIS — R519 Headache, unspecified: Secondary | ICD-10-CM

## 2019-10-14 DIAGNOSIS — G43909 Migraine, unspecified, not intractable, without status migrainosus: Secondary | ICD-10-CM | POA: Diagnosis not present

## 2019-10-14 MED ORDER — PROCHLORPERAZINE MALEATE 10 MG PO TABS
10.0000 mg | ORAL_TABLET | Freq: Four times a day (QID) | ORAL | Status: DC | PRN
  Administered 2019-10-14: 10 mg via ORAL
  Filled 2019-10-14: qty 1

## 2019-10-14 MED ORDER — DIPHENHYDRAMINE HCL 25 MG PO CAPS
25.0000 mg | ORAL_CAPSULE | Freq: Once | ORAL | Status: AC
  Administered 2019-10-14: 25 mg via ORAL
  Filled 2019-10-14: qty 1

## 2019-10-14 MED ORDER — KETOROLAC TROMETHAMINE 15 MG/ML IJ SOLN
15.0000 mg | Freq: Once | INTRAMUSCULAR | Status: AC
  Administered 2019-10-14: 15 mg via INTRAMUSCULAR
  Filled 2019-10-14: qty 1

## 2019-10-14 NOTE — ED Provider Notes (Signed)
[facility]-EMERGENCY DEPT Provider Note   CSN: [id number] Arrival date & time: 10/14/19  1025     History   Chief Complaint Chief Complaint  Patient presents with  . Neck Pain  . Eye Pain  . Otalgia    HPI Stacy Wilkins is a 33 y.o. female.     33 y.o female with a PMH of Anxiety, GERD, Migraine presents to the ED with a chief complaint of headache x 1 month. Patient describes a dull sensation to the right side of her face with radiation from her right neck onto her right face. She reports headache is constant, no exacerbating symptoms. She has tried ASA, Naproxen, ibuprofen, tylenol along with a neck injection by neurology with a suspected facial neuralgia. She has has multiple visits with neurology, ENT who reported she does have any ear issues, sinus issues or other acute process. She does have a current order for an MRI Brain which she has not scheduled yet. She denies any fever, neck rigidity, changes in vision or weakness.   The history is provided by the patient and medical records.    Past Medical History:  Diagnosis Date  . Anxiety   . Depression   . DVT (deep venous thrombosis) (HCC)   . Generalized headaches   . GERD (gastroesophageal reflux disease)   . Hyperlipidemia   . Migraine   . Ulcer     Patient Active Problem List   Diagnosis Date Noted  . Anxiety 09/16/2017  . History of DVT (deep vein thrombosis) 03/21/2017  . History of pulmonary embolism 03/21/2017  . Food allergy 11/08/2016  . Palpitations 02/27/2016  . Hemorrhoids 09/29/2015    Past Surgical History:  Procedure Laterality Date  . bunionectomy     . EYE SURGERY    . TONSILLECTOMY AND ADENOIDECTOMY       OB History   No obstetric history on file.      Home Medications    Prior to Admission medications   Medication Sig Start Date End Date Taking? Authorizing Provider  fluticasone (FLONASE) 50 MCG/ACT nasal spray SPRAY 2 SPRAYS INTO EACH NOSTRIL EVERY DAY  Patient taking differently: Place 2 sprays into both nostrils daily as needed for allergies or rhinitis.  04/18/18  Yes Barnett Abu, [place] D, PA-C  Multiple Vitamins-Minerals (THERA-M) TABS Take 1 tablet by mouth daily.    Yes [provider]  propranolol (INDERAL) 10 MG tablet Take 1 tablet (10 mg total) by mouth 4 (four) times daily as needed (Palpitations). 09/16/19  Yes Yates Decamp, MD  RABEprazole (ACIPHEX) 20 MG tablet Take 1 tablet (20 mg total) by mouth daily. 09/07/19  Yes Nandigam, Eleonore Chiquito, MD  valACYclovir (VALTREX) 500 MG tablet Take 1 tablet by mouth daily. 08/27/19  Yes [provider]    Family History Family History  Problem Relation Age of Onset  . Hypertension Mother   . Hypertension Father   . Prostate cancer Father   . Prostate cancer Brother   . Diabetes Maternal Uncle   . Colon cancer Neg Hx   . Liver cancer Neg Hx     Social History Social History   Tobacco Use  . Smoking status: Former Smoker    Packs/day: 0.25    Years: 3.00    Pack years: 0.75    Quit date: 2014    Years since quitting: 6.8  . Smokeless tobacco: Never Used  . Tobacco comment: used to smoke socially  Substance Use Topics  .  Alcohol use: No    Alcohol/week: 0.0 standard drinks    Frequency: Never  . Drug use: No     Allergies   Esomeprazole magnesium, Onion, and Other   Review of Systems Review of Systems  Constitutional: Negative for chills and fever.  HENT: Negative for ear pain and sore throat.   Eyes: Negative for pain and visual disturbance.  Respiratory: Negative for cough and shortness of breath.   Cardiovascular: Negative for chest pain and palpitations.  Gastrointestinal: Negative for abdominal pain and vomiting.  Genitourinary: Negative for dysuria and hematuria.  Musculoskeletal: Negative for arthralgias, back pain, neck pain and neck stiffness.  Skin: Negative for color change and rash.  Neurological: Positive for headaches. Negative for  seizures, syncope, facial asymmetry and light-headedness.  All other systems reviewed and are negative.    Physical Exam Updated Vital Signs BP 136/67 (BP Location: Right Arm)   Pulse 95   Temp 98.4 F (36.9 C) (Oral)   Resp 17   SpO2 100%   Physical Exam Vitals signs and nursing note reviewed.  Constitutional:      General: She is not in acute distress.    Appearance: She is well-developed.  HENT:     Head: Normocephalic and atraumatic.     Mouth/Throat:     Pharynx: No oropharyngeal exudate.  Eyes:     Pupils: Pupils are equal, round, and reactive to light.  Neck:     Musculoskeletal: Normal range of motion.  Cardiovascular:     Rate and Rhythm: Regular rhythm.     Heart sounds: Normal heart sounds.  Pulmonary:     Effort: Pulmonary effort is normal. No respiratory distress.     Breath sounds: Normal breath sounds.  Abdominal:     General: Bowel sounds are normal. There is no distension.     Palpations: Abdomen is soft.     Tenderness: There is no abdominal tenderness.  Musculoskeletal:        General: No tenderness or deformity.     Right lower leg: No edema.     Left lower leg: No edema.  Skin:    General: Skin is warm and dry.  Neurological:     Mental Status: She is alert and oriented to person, place, and time.     Comments: Alert, oriented, thought content appropriate. Speech fluent without evidence of aphasia. Able to follow 2 step commands without difficulty.  Cranial Nerves:  II:  Peripheral visual fields grossly normal, pupils, round, reactive to light III,IV, VI: ptosis not present, extra-ocular motions intact bilaterally  V,VII: smile symmetric, facial light touch sensation equal VIII: hearing grossly normal bilaterally  IX,X: midline uvula rise  XI: bilateral shoulder shrug equal and strong XII: midline tongue extension  Motor:  5/5 in upper and lower extremities bilaterally including strong and equal grip strength and dorsiflexion/plantar flexion  Sensory: light touch normal in all extremities.  Cerebellar: normal finger-to-nose with bilateral upper extremities, pronator drift negative Gait: normal gait and balance      ED Treatments / Results  Labs (all labs ordered are listed, but only abnormal results are displayed) Labs Reviewed - No data to display  EKG None  Radiology Ct Head Wo Contrast  Result Date: 10/14/2019 CLINICAL DATA:  Headache, particularly in the right periorbital region EXAM: CT HEAD WITHOUT CONTRAST TECHNIQUE: Contiguous axial images were obtained from the base of the skull through the vertex without intravenous contrast. COMPARISON:  None. FINDINGS: Brain: Ventricles normal in size and configuration.  There is no intracranial mass, hemorrhage, extra-axial fluid collection, or midline shift. The brain parenchyma appears unremarkable. No evident acute infarct. We will Vascular: There is no hyperdense vessel. No vascular calcifications evident. Skull: The bony calvarium appears intact. Sinuses/Orbits: Visualized paranasal sinuses are clear. Visualized orbits appear symmetric bilaterally. Other: Mastoid air cells are clear. IMPRESSION: Study within normal limits. Electronically Signed   By: Lowella Grip III M.D.   On: 10/14/2019 10:23    Procedures Procedures (including critical care time)  Medications Ordered in ED Medications  prochlorperazine (COMPAZINE) tablet 10 mg (has no administration in time range)  ketorolac (TORADOL) 15 MG/ML injection 15 mg (has no administration in time range)  diphenhydrAMINE (BENADRYL) capsule 25 mg (25 mg Oral Given 10/14/19 0945)     Initial Impression / Assessment and Plan / ED Course  I have reviewed the triage vital signs and the nursing notes.  Pertinent labs & imaging results that were available during my care of the patient were reviewed by me and considered in my medical decision making (see chart for details).     With a past medical history of headaches for  the past 6 months, recently evaluated by neurology, ENT presents to the ED with complaints of right-sided head pressure, was told by ENT that her sinuses along with ears were clear.  She reports she was told ENT that she will likely need an MRI of her brain, according to her chart which have extensively reviewed there is an order for an outpatient MRI brain.  On today's visit patient describes the pain on her head is the same as she usually has, she is unable to get any relief despite NSAIDs, cervical injection for a facial neuralgia, ibuprofen, aspirin.  She does not have any blurry vision, tingling, weakness.  Is ambulatory with a steady gait.  Her neuro exam is within normal limits.  I reviewed patient's chart and see no CT has been on file, she reports no has evaluated her with a CAT scan in the past.  A CT of her head was ordered which did not show any acute infarct, mass, fluid collection, midline shift.  Patient was provided with a headache cocktail with Compazine, Benadryl, along with Toradol with some improvement in symptoms.  A copy of her CT was given for patient.  She is advised to follow-up with ENT for further valuation with her MRI.  Patient understands and agrees with management, with stable vital signs, with a steady gait stable for discharge.  Portions of this note were generated with [employer]. Dictation errors may occur despite best attempts at proofreading.  Final Clinical Impressions(s) / ED Diagnoses   Final diagnoses:  Bad headache    ED Discharge Orders    None       Janeece Fitting, PA-C 10/14/19 1115    Dorie Rank, MD 10/14/19 1954

## 2019-10-14 NOTE — Discharge Instructions (Addendum)
Your CT today was within normal limits, a report of her CT head was provided for you.  Please follow-up with your ENT along with your MRI brain in order to obtain further evaluation for your headache.

## 2019-10-14 NOTE — ED Triage Notes (Addendum)
Patient c/o right sided head pressure, ear pressure, and right neck pain intermittent X1 month.  Patient is c/o of right eye pressure that started this morning.   Patient has went to urgent care twice and was told once she had an ear infection and once that she had musculoskeletal pain. Patient was told this was not meningitis.   Patient was then referred to go to chiropractor and to get massage. Patient had no relief.    Patient was referred to a neurologist was told she had occipital myalgia and given a shot in the back of neck.   No relief and patient was told she needed MRI.    A/Ox4 Ambulatory in triage.   Denies being in contact with anyone who tested positive for Covid lately.

## 2019-10-20 DIAGNOSIS — Z20828 Contact with and (suspected) exposure to other viral communicable diseases: Secondary | ICD-10-CM | POA: Diagnosis not present

## 2019-11-12 ENCOUNTER — Other Ambulatory Visit: Payer: BC Managed Care – PPO

## 2019-11-17 ENCOUNTER — Other Ambulatory Visit: Payer: BC Managed Care – PPO | Attending: Internal Medicine

## 2019-11-17 ENCOUNTER — Telehealth: Payer: BC Managed Care – PPO | Admitting: Emergency Medicine

## 2019-11-17 DIAGNOSIS — Z20828 Contact with and (suspected) exposure to other viral communicable diseases: Secondary | ICD-10-CM | POA: Diagnosis not present

## 2019-11-17 DIAGNOSIS — Z20822 Contact with and (suspected) exposure to covid-19: Secondary | ICD-10-CM

## 2019-11-17 MED ORDER — BENZONATATE 100 MG PO CAPS: 100.0000 mg | ORAL_CAPSULE | Freq: Two times a day (BID) | ORAL | 0 refills | Status: DC | PRN

## 2019-11-17 NOTE — Progress Notes (Signed)
E-Visit for Corona Virus Screening   Your current symptoms could be consistent with the coronavirus.  Many health care providers can now test patients at their office but not all are.   has multiple testing sites. For information on our COVID testing locations and hours go to [email or web address]  We are enrolling you in our MyChart Home Monitoring for COVID19 . Daily you will receive a questionnaire within the MyChart website. Our COVID 19 response team will be monitoring your responses daily.  Testing Information: The COVID-19 Community Testing sites will begin testing BY APPOINTMENT ONLY.  You can schedule online at [email or web address]  If you do not have access to a smart phone or computer you may call [phone] for an appointment.  Testing Locations: Appointment schedule is 8 am to 3:30 pm at all sites  [place] indoors at [address], [place] [address] [facility]  indoors at [address] Vis[address]trance, [place], [place] [address] [place] indoors at [address], [place] [address]  Additional testing sites in the Community:  . For [facility] Testing sites in [place]  https://www.cvs.com/minuteclinic/covid-19-testing  . For Pop-up testing sites in [place]  https://covid19.ncdhhs.gov/about-covid-19/testing/find-my-testing-place/pop-testing-sites  . For Testing sites with regular hours https://onsms.org/[place]/  . For [place] [facility] https://[facility].com/covid-19-community-outreach-testing/  . For [facility] https://www.[place].gov[place]/human-services/health-department/coronavirus-covid-19-info/covid-19-testing  . For [place] testing in [place] https://www.[place].gov/[place]/human-services/health-department/coronavirus-covid-19-info/covid-19-testing  . For [facility] testing in [place]   https://[facility].care/covidtesting  For  more  information about community testing call [phone]   We are enrolling you in our MyChart Home Monitoring for COVID19 . Daily you will receive a questionnaire within the MyChart website. Our COVID 19 response team will be monitoring your responses daily.  Please quarantine yourself while awaiting your test results. If you develop fever/cough/breathlessness, please stay home for 10 days with improving symptoms and until you have had 24 hours of no fever (without taking a fever reducer).  You should wear a mask or cloth face covering over your nose and mouth if you must be around other people or animals, including pets (even at home). Try to stay at least 6 feet away from other people. This will protect the people around you.  Please continue good preventive care measures, including:  frequent hand-washing, avoid touching your face, cover coughs/sneezes, stay out of crowds and keep a 6 foot distance from others.  COVID-19 is a respiratory illness with symptoms that are similar to the flu. Symptoms are typically mild to moderate, but there have been cases of severe illness and death due to the virus.   The following symptoms may appear 2-14 days after exposure: . Fever . Cough . Shortness of breath or difficulty breathing . Chills . Repeated shaking with chills . Muscle pain . Headache . Sore throat . New loss of taste or smell . Fatigue . Congestion or runny nose . Nausea or vomiting . Diarrhea  Go to the nearest hospital ED for assessment if fever/cough/breathlessness are severe or illness seems like a threat to life.  It is vitally important that if you feel that you have an infection such as this virus or any other virus that you stay home and away from places where you may spread it to others.  You should avoid contact with people age 65 and older.   You can use medication such as A prescription cough medication called Tessalon Perles 100 mg. You may take 1-2 capsules every 8 hours as    needed for cough  You may also take acetaminophen (Tylenol) as needed for fever.  Reduce your risk of any infection by using the same precautions used for avoiding the common cold or flu:  . Wash your hands often with soap and warm water for at least 20 seconds.  If soap and water are not readily available, use an alcohol-based hand sanitizer with at least 60% alcohol.  . If coughing or sneezing, cover your mouth and nose by coughing or sneezing into the elbow areas of your shirt or coat, into a tissue or into your sleeve (not your hands). . Avoid shaking hands with others and consider head nods or verbal greetings only. . Avoid touching your eyes, nose, or mouth with unwashed hands.  . Avoid close contact with people who are sick. . Avoid places or events with large numbers of people in one location, like concerts or sporting events. . Carefully consider travel plans you have or are making. . If you are planning any travel outside or inside the [place], visit the CDC's Travelers' Health webpage for the latest health notices. . If you have some symptoms but not all symptoms, continue to monitor at home and seek medical attention if your symptoms worsen. . If you are having a medical emergency, call 911.  HOME CARE . Only take medications as instructed by your medical team. . Drink plenty of fluids and get plenty of rest. . A steam or ultrasonic humidifier can help if you have congestion.   GET HELP RIGHT AWAY IF YOU HAVE EMERGENCY WARNING SIGNS** FOR COVID-19. If you or someone is showing any of these signs seek emergency medical care immediately. Call 911 or proceed to your closest emergency facility if: . You develop worsening high fever. . Trouble breathing . Bluish lips or face . Persistent pain or pressure in the chest . New confusion . Inability to wake or stay awake . You cough up blood. . Your symptoms become more severe  **This list is not all possible symptoms. Contact your  medical provider for any symptoms that are sever or concerning to you.  MAKE SURE YOU   Understand these instructions.  Will watch your condition.  Will get help right away if you are not doing well or get worse.  Your e-visit answers were reviewed by a board certified advanced clinical practitioner to complete your personal care plan.  Depending on the condition, your plan could have included both over the counter or prescription medications.  If there is a problem please reply once you have received a response from your provider.  Your safety is important to us.  If you have drug allergies check your prescription carefully.    You can use MyChart to ask questions about today's visit, request a non-urgent call back, or ask for a work or school excuse for 24 hours related to this e-Visit. If it has been greater than 24 hours you will need to follow up with your provider, or enter a new e-Visit to address those concerns. You will get an e-mail in the next two days asking about your experience.  I hope that your e-visit has been valuable and will speed your recovery. Thank you for using e-visits.   Approximately 5 minutes was used in reviewing the patient's chart, questionnaire, prescribing medications, and documentation.  

## 2019-11-18 ENCOUNTER — Other Ambulatory Visit: Payer: BC Managed Care – PPO

## 2019-11-19 LAB — NOVEL CORONAVIRUS, NAA: SARS-CoV-2, NAA: NOT DETECTED

## 2019-12-04 DIAGNOSIS — Z87891 Personal history of nicotine dependence: Secondary | ICD-10-CM | POA: Diagnosis not present

## 2019-12-04 DIAGNOSIS — X58XXXD Exposure to other specified factors, subsequent encounter: Secondary | ICD-10-CM | POA: Diagnosis not present

## 2019-12-04 DIAGNOSIS — Z0000 Encounter for general adult medical examination without abnormal findings: Secondary | ICD-10-CM | POA: Diagnosis not present

## 2019-12-04 DIAGNOSIS — S161XXD Strain of muscle, fascia and tendon at neck level, subsequent encounter: Secondary | ICD-10-CM | POA: Diagnosis not present

## 2019-12-04 DIAGNOSIS — J301 Allergic rhinitis due to pollen: Secondary | ICD-10-CM | POA: Diagnosis not present

## 2019-12-07 ENCOUNTER — Ambulatory Visit
Admission: RE | Admit: 2019-12-07 | Discharge: 2019-12-07 | Disposition: A | Discharge status: Home / Self Care | Payer: BC Managed Care – PPO | Source: Ambulatory Visit | Attending: Otolaryngology | Admitting: Otolaryngology

## 2019-12-07 ENCOUNTER — Other Ambulatory Visit: Payer: Self-pay

## 2019-12-07 DIAGNOSIS — R519 Headache, unspecified: Secondary | ICD-10-CM | POA: Diagnosis not present

## 2019-12-07 MED ORDER — GADOBENATE DIMEGLUMINE 529 MG/ML IV SOLN
15.0000 mL | Freq: Once | INTRAVENOUS | Status: AC | PRN
  Administered 2019-12-07: 15 mL via INTRAVENOUS

## 2019-12-21 DIAGNOSIS — R229 Localized swelling, mass and lump, unspecified: Secondary | ICD-10-CM | POA: Diagnosis not present

## 2020-02-11 DIAGNOSIS — H6983 Other specified disorders of Eustachian tube, bilateral: Secondary | ICD-10-CM | POA: Diagnosis not present

## 2020-02-17 DIAGNOSIS — J309 Allergic rhinitis, unspecified: Secondary | ICD-10-CM | POA: Diagnosis not present

## 2020-02-17 DIAGNOSIS — J0100 Acute maxillary sinusitis, unspecified: Secondary | ICD-10-CM | POA: Diagnosis not present

## 2020-02-17 DIAGNOSIS — H6981 Other specified disorders of Eustachian tube, right ear: Secondary | ICD-10-CM | POA: Diagnosis not present

## 2020-02-17 DIAGNOSIS — Z299 Encounter for prophylactic measures, unspecified: Secondary | ICD-10-CM | POA: Diagnosis not present

## 2020-03-07 DIAGNOSIS — D1801 Hemangioma of skin and subcutaneous tissue: Secondary | ICD-10-CM | POA: Diagnosis not present

## 2020-03-07 DIAGNOSIS — L814 Other melanin hyperpigmentation: Secondary | ICD-10-CM | POA: Diagnosis not present

## 2020-03-07 DIAGNOSIS — D225 Melanocytic nevi of trunk: Secondary | ICD-10-CM | POA: Diagnosis not present

## 2020-03-07 DIAGNOSIS — L578 Other skin changes due to chronic exposure to nonionizing radiation: Secondary | ICD-10-CM | POA: Diagnosis not present

## 2020-03-16 ENCOUNTER — Ambulatory Visit: Payer: BC Managed Care – PPO | Admitting: Gastroenterology

## 2020-03-23 ENCOUNTER — Emergency Department (HOSPITAL_BASED_OUTPATIENT_CLINIC_OR_DEPARTMENT_OTHER): Payer: BC Managed Care – PPO

## 2020-03-23 ENCOUNTER — Encounter (HOSPITAL_BASED_OUTPATIENT_CLINIC_OR_DEPARTMENT_OTHER): Payer: Self-pay | Admitting: *Deleted

## 2020-03-23 ENCOUNTER — Other Ambulatory Visit: Payer: Self-pay

## 2020-03-23 ENCOUNTER — Emergency Department (HOSPITAL_BASED_OUTPATIENT_CLINIC_OR_DEPARTMENT_OTHER)
Admission: EM | Admit: 2020-03-23 | Discharge: 2020-03-23 | Disposition: A | Discharge status: Home / Self Care | Payer: BC Managed Care – PPO | Attending: Emergency Medicine | Admitting: Emergency Medicine

## 2020-03-23 DIAGNOSIS — M79605 Pain in left leg: Secondary | ICD-10-CM | POA: Diagnosis not present

## 2020-03-23 DIAGNOSIS — Z5321 Procedure and treatment not carried out due to patient leaving prior to being seen by health care provider: Secondary | ICD-10-CM | POA: Diagnosis not present

## 2020-03-23 DIAGNOSIS — M79662 Pain in left lower leg: Secondary | ICD-10-CM | POA: Diagnosis not present

## 2020-03-23 DIAGNOSIS — Z86718 Personal history of other venous thrombosis and embolism: Secondary | ICD-10-CM | POA: Diagnosis not present

## 2020-03-23 NOTE — ED Triage Notes (Signed)
Left calf pain x 2 days. Hx of DVT and PE. No radiation of pain, no warmth or redness noted. Denies SOB

## 2020-03-28 DIAGNOSIS — Z3046 Encounter for surveillance of implantable subdermal contraceptive: Secondary | ICD-10-CM | POA: Diagnosis not present

## 2020-04-11 DIAGNOSIS — Z30011 Encounter for initial prescription of contraceptive pills: Secondary | ICD-10-CM | POA: Diagnosis not present

## 2020-04-11 DIAGNOSIS — A6009 Herpesviral infection of other urogenital tract: Secondary | ICD-10-CM | POA: Diagnosis not present

## 2020-04-18 ENCOUNTER — Ambulatory Visit: Payer: BC Managed Care – PPO | Admitting: Gastroenterology

## 2020-04-18 DIAGNOSIS — M9905 Segmental and somatic dysfunction of pelvic region: Secondary | ICD-10-CM | POA: Diagnosis not present

## 2020-04-18 DIAGNOSIS — M9903 Segmental and somatic dysfunction of lumbar region: Secondary | ICD-10-CM | POA: Diagnosis not present

## 2020-04-18 DIAGNOSIS — M9902 Segmental and somatic dysfunction of thoracic region: Secondary | ICD-10-CM | POA: Diagnosis not present

## 2020-04-18 DIAGNOSIS — M9901 Segmental and somatic dysfunction of cervical region: Secondary | ICD-10-CM | POA: Diagnosis not present

## 2020-04-19 DIAGNOSIS — L0100 Impetigo, unspecified: Secondary | ICD-10-CM | POA: Diagnosis not present

## 2020-04-19 DIAGNOSIS — L0889 Other specified local infections of the skin and subcutaneous tissue: Secondary | ICD-10-CM | POA: Diagnosis not present

## 2020-04-19 DIAGNOSIS — L719 Rosacea, unspecified: Secondary | ICD-10-CM | POA: Diagnosis not present

## 2020-04-19 DIAGNOSIS — L858 Other specified epidermal thickening: Secondary | ICD-10-CM | POA: Diagnosis not present

## 2020-04-28 DIAGNOSIS — M9905 Segmental and somatic dysfunction of pelvic region: Secondary | ICD-10-CM | POA: Diagnosis not present

## 2020-04-28 DIAGNOSIS — M9902 Segmental and somatic dysfunction of thoracic region: Secondary | ICD-10-CM | POA: Diagnosis not present

## 2020-04-28 DIAGNOSIS — M9903 Segmental and somatic dysfunction of lumbar region: Secondary | ICD-10-CM | POA: Diagnosis not present

## 2020-04-28 DIAGNOSIS — M9901 Segmental and somatic dysfunction of cervical region: Secondary | ICD-10-CM | POA: Diagnosis not present

## 2020-04-29 ENCOUNTER — Other Ambulatory Visit: Payer: Self-pay

## 2020-04-29 ENCOUNTER — Other Ambulatory Visit: Payer: Self-pay | Admitting: Gastroenterology

## 2020-04-29 DIAGNOSIS — K581 Irritable bowel syndrome with constipation: Secondary | ICD-10-CM

## 2020-04-29 DIAGNOSIS — R1032 Left lower quadrant pain: Secondary | ICD-10-CM

## 2020-04-29 MED ORDER — OMEPRAZOLE 40 MG PO CPDR: 40.0000 mg | DELAYED_RELEASE_CAPSULE | Freq: Every day | ORAL | 3 refills | Status: DC

## 2020-04-29 NOTE — Telephone Encounter (Signed)
RX for Omeprazole 40 mg sent to patient's preferred pharmacy, spoke with patient regarding this information.

## 2020-04-29 NOTE — Telephone Encounter (Signed)
Ok to send rx for Omeprazole 40mg  daily X90 days with 3 refills. Thanks

## 2020-04-29 NOTE — Telephone Encounter (Signed)
Pt states she would like to  Go back to omeprazole 40mg  daily. She states the aciphex is to expensive. Please advise.

## 2020-05-03 DIAGNOSIS — M9903 Segmental and somatic dysfunction of lumbar region: Secondary | ICD-10-CM | POA: Diagnosis not present

## 2020-05-03 DIAGNOSIS — M9902 Segmental and somatic dysfunction of thoracic region: Secondary | ICD-10-CM | POA: Diagnosis not present

## 2020-05-03 DIAGNOSIS — M9901 Segmental and somatic dysfunction of cervical region: Secondary | ICD-10-CM | POA: Diagnosis not present

## 2020-05-03 DIAGNOSIS — M9905 Segmental and somatic dysfunction of pelvic region: Secondary | ICD-10-CM | POA: Diagnosis not present

## 2020-05-05 DIAGNOSIS — M9903 Segmental and somatic dysfunction of lumbar region: Secondary | ICD-10-CM | POA: Diagnosis not present

## 2020-05-05 DIAGNOSIS — M9905 Segmental and somatic dysfunction of pelvic region: Secondary | ICD-10-CM | POA: Diagnosis not present

## 2020-05-05 DIAGNOSIS — M9902 Segmental and somatic dysfunction of thoracic region: Secondary | ICD-10-CM | POA: Diagnosis not present

## 2020-05-05 DIAGNOSIS — M9901 Segmental and somatic dysfunction of cervical region: Secondary | ICD-10-CM | POA: Diagnosis not present

## 2020-05-06 DIAGNOSIS — Z30017 Encounter for initial prescription of implantable subdermal contraceptive: Secondary | ICD-10-CM | POA: Diagnosis not present

## 2020-05-11 DIAGNOSIS — M9901 Segmental and somatic dysfunction of cervical region: Secondary | ICD-10-CM | POA: Diagnosis not present

## 2020-05-11 DIAGNOSIS — M9902 Segmental and somatic dysfunction of thoracic region: Secondary | ICD-10-CM | POA: Diagnosis not present

## 2020-05-11 DIAGNOSIS — M9903 Segmental and somatic dysfunction of lumbar region: Secondary | ICD-10-CM | POA: Diagnosis not present

## 2020-05-11 DIAGNOSIS — M9905 Segmental and somatic dysfunction of pelvic region: Secondary | ICD-10-CM | POA: Diagnosis not present

## 2020-05-18 DIAGNOSIS — M9905 Segmental and somatic dysfunction of pelvic region: Secondary | ICD-10-CM | POA: Diagnosis not present

## 2020-05-18 DIAGNOSIS — M9903 Segmental and somatic dysfunction of lumbar region: Secondary | ICD-10-CM | POA: Diagnosis not present

## 2020-05-18 DIAGNOSIS — M9902 Segmental and somatic dysfunction of thoracic region: Secondary | ICD-10-CM | POA: Diagnosis not present

## 2020-05-18 DIAGNOSIS — M9901 Segmental and somatic dysfunction of cervical region: Secondary | ICD-10-CM | POA: Diagnosis not present

## 2020-05-20 DIAGNOSIS — N898 Other specified noninflammatory disorders of vagina: Secondary | ICD-10-CM | POA: Diagnosis not present

## 2020-05-20 DIAGNOSIS — R350 Frequency of micturition: Secondary | ICD-10-CM | POA: Diagnosis not present

## 2020-05-20 DIAGNOSIS — R300 Dysuria: Secondary | ICD-10-CM | POA: Diagnosis not present

## 2020-05-26 DIAGNOSIS — M9902 Segmental and somatic dysfunction of thoracic region: Secondary | ICD-10-CM | POA: Diagnosis not present

## 2020-05-26 DIAGNOSIS — M9905 Segmental and somatic dysfunction of pelvic region: Secondary | ICD-10-CM | POA: Diagnosis not present

## 2020-05-26 DIAGNOSIS — M9903 Segmental and somatic dysfunction of lumbar region: Secondary | ICD-10-CM | POA: Diagnosis not present

## 2020-05-26 DIAGNOSIS — M9901 Segmental and somatic dysfunction of cervical region: Secondary | ICD-10-CM | POA: Diagnosis not present

## 2020-06-02 DIAGNOSIS — M9902 Segmental and somatic dysfunction of thoracic region: Secondary | ICD-10-CM | POA: Diagnosis not present

## 2020-06-02 DIAGNOSIS — M9901 Segmental and somatic dysfunction of cervical region: Secondary | ICD-10-CM | POA: Diagnosis not present

## 2020-06-02 DIAGNOSIS — M9905 Segmental and somatic dysfunction of pelvic region: Secondary | ICD-10-CM | POA: Diagnosis not present

## 2020-06-02 DIAGNOSIS — M9903 Segmental and somatic dysfunction of lumbar region: Secondary | ICD-10-CM | POA: Diagnosis not present

## 2020-06-06 ENCOUNTER — Ambulatory Visit: Payer: BC Managed Care – PPO | Admitting: Gastroenterology

## 2020-06-06 DIAGNOSIS — R519 Headache, unspecified: Secondary | ICD-10-CM | POA: Diagnosis not present

## 2020-06-06 DIAGNOSIS — M542 Cervicalgia: Secondary | ICD-10-CM | POA: Diagnosis not present

## 2020-07-04 DIAGNOSIS — D485 Neoplasm of uncertain behavior of skin: Secondary | ICD-10-CM | POA: Diagnosis not present

## 2020-07-04 DIAGNOSIS — L820 Inflamed seborrheic keratosis: Secondary | ICD-10-CM | POA: Diagnosis not present

## 2020-07-18 DIAGNOSIS — Z91018 Allergy to other foods: Secondary | ICD-10-CM | POA: Diagnosis not present

## 2020-08-19 DIAGNOSIS — R232 Flushing: Secondary | ICD-10-CM | POA: Diagnosis not present

## 2020-08-19 DIAGNOSIS — R202 Paresthesia of skin: Secondary | ICD-10-CM | POA: Diagnosis not present

## 2020-08-19 DIAGNOSIS — R5383 Other fatigue: Secondary | ICD-10-CM | POA: Diagnosis not present

## 2020-08-19 DIAGNOSIS — R109 Unspecified abdominal pain: Secondary | ICD-10-CM | POA: Diagnosis not present

## 2020-08-24 ENCOUNTER — Other Ambulatory Visit: Payer: Self-pay | Admitting: Family Medicine

## 2020-08-24 DIAGNOSIS — R7989 Other specified abnormal findings of blood chemistry: Secondary | ICD-10-CM | POA: Diagnosis not present

## 2020-08-24 DIAGNOSIS — R102 Pelvic and perineal pain: Secondary | ICD-10-CM

## 2020-08-24 DIAGNOSIS — R1032 Left lower quadrant pain: Secondary | ICD-10-CM | POA: Diagnosis not present

## 2020-08-24 DIAGNOSIS — M545 Low back pain: Secondary | ICD-10-CM | POA: Diagnosis not present

## 2020-08-29 DIAGNOSIS — R1032 Left lower quadrant pain: Secondary | ICD-10-CM | POA: Diagnosis not present

## 2020-08-31 ENCOUNTER — Ambulatory Visit: Payer: BC Managed Care – PPO

## 2020-08-31 ENCOUNTER — Encounter: Payer: Self-pay | Admitting: Gastroenterology

## 2020-08-31 ENCOUNTER — Ambulatory Visit: Payer: BC Managed Care – PPO | Admitting: Gastroenterology

## 2020-08-31 VITALS — BP 94/70 | HR 104 | Ht 65.25 in | Wt 168.4 lb

## 2020-08-31 DIAGNOSIS — K649 Unspecified hemorrhoids: Secondary | ICD-10-CM

## 2020-08-31 DIAGNOSIS — K625 Hemorrhage of anus and rectum: Secondary | ICD-10-CM

## 2020-08-31 DIAGNOSIS — K5904 Chronic idiopathic constipation: Secondary | ICD-10-CM

## 2020-08-31 DIAGNOSIS — R1032 Left lower quadrant pain: Secondary | ICD-10-CM

## 2020-08-31 MED ORDER — LINACLOTIDE 72 MCG PO CAPS: 72.0000 ug | ORAL_CAPSULE | Freq: Every day | ORAL | 2 refills | Status: DC

## 2020-08-31 MED ORDER — SUPREP BOWEL PREP KIT 17.5-3.13-1.6 GM/177ML PO SOLN: 1.0000 | Freq: Once | ORAL | 0 refills | Status: AC

## 2020-08-31 NOTE — Progress Notes (Signed)
Stacy Wilkins    [id number]    07-25-1986  Primary Care Physician:Kaplan, Isidor Holts., PA-C  Referring Physician: Richmond Campbell., PA-C [address],  [place] [address]   Chief complaint:  LLQ abd pain, constipation, rectal bleeding, hemorrhoids  HPI: 34 year old very pleasant female here for follow-up visit with complaints of symptomatic hemorrhoids, constipation and left lower quadrant abdominal pain Continues to have persistent left lower abdominal pain radiating to the back, is worse after meals and is better when she has a bowel movement with complete evacuation.  Her symptoms do get worse during menstrual cycle but she has not been able to consistently track her symptoms.  She has increased symptoms from hemorrhoids when she gets constipated, has noted swelling and protrusion of hemorrhoids and also has been having intermittent rectal bleeding  Pelvic trans vaginal ultrasound showed complex left ovarian cyst,is followed by GYN. No family history of endometriosis  No dysphagia, odynophagia, nausea, vomiting, melena or blood per rectum. No weight loss  EGD3/10/2018:Unremarkable, CLOtest negative  CT abdomen and pelvis with contrast April 27, 2019 1. No CT abnormality of the abdomen or pelvis to explain left flank pain or left lower quadrant pain.  2. No evidence of urinary tract calculus or hydronephrosis.  3. No significant diverticular disease.  Pelvic ultrasound December 11, 2018 4 cm complex cyst in the left ovary. Given left lower quadrant pain and possible thickened septation in the cyst, follow-up ultrasound in 6-12 weeks recommended to ensure resolution.  Outpatient Encounter Medications as of 08/31/2020  Medication Sig  . fluticasone (FLONASE) 50 MCG/ACT nasal spray SPRAY 2 SPRAYS INTO EACH NOSTRIL EVERY DAY (Patient taking differently: Place 2 sprays into both nostrils daily as needed for allergies or rhinitis. )  . Multiple  Vitamins-Minerals (THERA-M) TABS Take 1 tablet by mouth daily.   Marland Kitchen omeprazole (PRILOSEC) 40 MG capsule Take 1 capsule (40 mg total) by mouth daily.  . valACYclovir (VALTREX) 500 MG tablet Take 1 tablet by mouth as needed.   . [DISCONTINUED] benzonatate (TESSALON) 100 MG capsule Take 1 capsule (100 mg total) by mouth 2 (two) times daily as needed for cough.  . [DISCONTINUED] propranolol (INDERAL) 10 MG tablet Take 1 tablet (10 mg total) by mouth 4 (four) times daily as needed (Palpitations).  . [DISCONTINUED] RABEprazole (ACIPHEX) 20 MG tablet Take 1 tablet (20 mg total) by mouth daily.   Facility-Administered Encounter Medications as of 08/31/2020  Medication  . 0.9 %  sodium chloride infusion    Allergies as of 08/31/2020 - Review Complete 03/23/2020  Allergen Reaction Noted  . Esomeprazole magnesium Nausea And Vomiting 09/29/2015  . Onion Nausea And Vomiting 05/27/2017  . Other Nausea And Vomiting 05/27/2017    Past Medical History:  Diagnosis Date  . Anxiety   . Depression   . DVT (deep venous thrombosis) (HCC)   . Generalized headaches   . GERD (gastroesophageal reflux disease)   . Hyperlipidemia   . Migraine   . Ulcer     Past Surgical History:  Procedure Laterality Date  . bunionectomy     . EYE SURGERY    . TONSILLECTOMY AND ADENOIDECTOMY      Family History  Problem Relation Age of Onset  . Hypertension Mother   . Hypertension Father   . Prostate cancer Father   . Prostate cancer Brother   . Diabetes Maternal Uncle   . Colon cancer Neg Hx   . Liver  cancer Neg Hx     Social History   Socioeconomic History  . Marital status: Single    Spouse name: Not on file  . Number of children: 0  . Years of education: Not on file  . Highest education level: Not on file  Occupational History  . Occupation: Billing/Insurance    Comment: Nutritional therapist  Tobacco Use  . Smoking status: Former Smoker    Packs/day: 0.25    Years: 3.00    Pack years: 0.75     Quit date: 2014    Years since quitting: 7.7  . Smokeless tobacco: Never Used  . Tobacco comment: used to smoke socially  Vaping Use  . Vaping Use: Never used  Substance and Sexual Activity  . Alcohol use: No    Alcohol/week: 0.0 standard drinks  . Drug use: No  . Sexual activity: Not Currently    Birth control/protection: Implant  Other Topics Concern  . Not on file  Social History Narrative  . Not on file   Social Determinants of Health   Financial Resource Strain:   . Difficulty of Paying Living Expenses: Not on file  Food Insecurity:   . Worried About Programme researcher, broadcasting/film/video in the Last Year: Not on file  . Ran Out of Food in the Last Year: Not on file  Transportation Needs:   . Lack of Transportation (Medical): Not on file  . Lack of Transportation (Non-Medical): Not on file  Physical Activity:   . Days of Exercise per Week: Not on file  . Minutes of Exercise per Session: Not on file  Stress:   . Feeling of Stress : Not on file  Social Connections:   . Frequency of Communication with Friends and Family: Not on file  . Frequency of Social Gatherings with Friends and Family: Not on file  . Attends Religious Services: Not on file  . Active Member of Clubs or Organizations: Not on file  . Attends Banker Meetings: Not on file  . Marital Status: Not on file  Intimate Partner Violence:   . Fear of Current or Ex-Partner: Not on file  . Emotionally Abused: Not on file  . Physically Abused: Not on file  . Sexually Abused: Not on file      Review of systems: All other review of systems negative except as mentioned in the HPI.   Physical Exam: Vitals:   08/31/20 0805  BP: 94/70  Pulse: (!) 104   Body mass index is 27.8 kg/m. Gen:      No acute distress HEENT:  sclera anicteric Abd:      soft, non-tender; no palpable masses, no distension Ext:    No edema Neuro: alert and oriented x 3 Psych: normal mood and affect Deferred rectal exam  Data  Reviewed:  Reviewed labs, radiology imaging, old records and pertinent past GI work up   Assessment and Plan/Recommendations:  34 year old very pleasant female with complaints of left lower quadrant abdominal pain associated with constipation and intermittent rectal bleeding  Intermittent bright red blood per rectum likely secondary to internal hemorrhoids small-volume hemorrhage but will need to exclude neoplastic lesion or proctitis associated with IBD  Will start do a trial of low-dose Linzess 72 mcg daily Increase dietary fiber and fluid intake  Schedule for colonoscopy for further evaluation, exclude neoplastic lesion or IBD  If colonoscopy unremarkable, may need to consider further work-up to exclude endometriosis  The risks and benefits as well as alternatives of  endoscopic procedure(s) have been discussed and reviewed. All questions answered. The patient agrees to proceed.    The patient was provided an opportunity to ask questions and all were answered. The patient agreed with the plan and demonstrated an understanding of the instructions.  Iona Beard , MD    CC: Richmond Campbell., PA-C

## 2020-08-31 NOTE — Patient Instructions (Addendum)
You have been scheduled for a colonoscopy. Please follow written instructions given to you at your visit today.  Please pick up your prep supplies at the pharmacy within the next 1-3 days. If you use inhalers (even only as needed), please bring them with you on the day of your procedure.   We have given you samples of Linzess and we will send in a prescription to your pharmacy   Due to recent changes in healthcare laws, you may see the results of your imaging and laboratory studies on MyChart before your provider has had a chance to review them.  We understand that in some cases there may be results that are confusing or concerning to you. Not all laboratory results come back in the same time frame and the provider may be waiting for multiple results in order to interpret others.  Please give [place] 48 hours in order for your provider to thoroughly review all the results before contacting the office for clarification of your results.   If you are age 74 or older, your body mass index should be between 23-30. Your Body mass index is 27.8 kg/m. If this is out of the aforementioned range listed, please consider follow up with your Primary Care Provider.  If you are age 72 or younger, your body mass index should be between 19-25. Your Body mass index is 27.8 kg/m. If this is out of the aformentioned range listed, please consider follow up with your Primary Care Provider.    I appreciate the  opportunity to care for you  Thank You   Marsa Aris , MD

## 2020-09-01 ENCOUNTER — Telehealth: Payer: Self-pay | Admitting: *Deleted

## 2020-09-01 ENCOUNTER — Encounter: Payer: Self-pay | Admitting: Gastroenterology

## 2020-09-01 ENCOUNTER — Encounter: Payer: Self-pay | Admitting: *Deleted

## 2020-09-01 DIAGNOSIS — R350 Frequency of micturition: Secondary | ICD-10-CM | POA: Diagnosis not present

## 2020-09-01 DIAGNOSIS — K219 Gastro-esophageal reflux disease without esophagitis: Secondary | ICD-10-CM | POA: Insufficient documentation

## 2020-09-01 NOTE — Telephone Encounter (Signed)
Linzess and omeprazole sent to Cover My Meds today for prior authorization, waiting on response

## 2020-09-02 ENCOUNTER — Telehealth: Payer: Self-pay | Admitting: Gastroenterology

## 2020-09-02 NOTE — Telephone Encounter (Signed)
Received fax from [facility] that denied PA for omeprazole and states any drug that is the therapeutically equivalent to an over-the-counter drug is not covered under this plan. Waiting on response about Linzess.

## 2020-09-02 NOTE — Telephone Encounter (Signed)
Pt is requesting a call back from a nurse, pt is experiencing chills, and the pain has increased around her hip area. Pt would like a call back to advise what to do.

## 2020-09-05 DIAGNOSIS — R300 Dysuria: Secondary | ICD-10-CM | POA: Diagnosis not present

## 2020-09-05 DIAGNOSIS — N760 Acute vaginitis: Secondary | ICD-10-CM | POA: Diagnosis not present

## 2020-09-05 NOTE — Telephone Encounter (Signed)
Spoke with the patient. She has been treated with antibiotics for 3 days by her PCP for UTI. She is feeling a little better now.

## 2020-09-05 NOTE — Telephone Encounter (Signed)
Dr Lavon Paganini this patients insurance will not pay for Linzess, wants her to have tried and failed Trulance or Amitiza... Do you want to switch her, Also they will not pay for omeprazole because its an OTC medication

## 2020-09-06 NOTE — Telephone Encounter (Signed)
Please send prescription for Amitiza 24 mcg twice daily.  If she is unable to tolerate it or her symptoms do not improve will appeal for Linzess.  Thank you

## 2020-09-08 ENCOUNTER — Ambulatory Visit: Payer: BC Managed Care – PPO | Admitting: Gastroenterology

## 2020-09-08 DIAGNOSIS — R079 Chest pain, unspecified: Secondary | ICD-10-CM | POA: Diagnosis not present

## 2020-09-08 MED ORDER — LUBIPROSTONE 24 MCG PO CAPS: 24.0000 ug | ORAL_CAPSULE | Freq: Two times a day (BID) | ORAL | 3 refills | Status: DC

## 2020-09-08 NOTE — Telephone Encounter (Signed)
Spoke with patient and she will pick up the Amitiza

## 2020-09-11 DIAGNOSIS — R079 Chest pain, unspecified: Secondary | ICD-10-CM | POA: Diagnosis not present

## 2020-09-13 ENCOUNTER — Telehealth: Payer: Self-pay | Admitting: Gastroenterology

## 2020-09-13 NOTE — Telephone Encounter (Signed)
This was already completed through Cover My Meds Will have to look up results of request

## 2020-09-13 NOTE — Telephone Encounter (Signed)
Stacy Wilkins from [facility] is requesting a pre authorization on the pt's Amitiza

## 2020-09-14 NOTE — Telephone Encounter (Signed)
Chatted with Cover My Meds about patients Amitiza, Eligibility is  not being covered with the KEY from pharmacy that was sent to me. Waiting on hard copy from [facility] and [facility] to be faxed to me to fill out and fax back manually

## 2020-09-15 ENCOUNTER — Encounter: Payer: Self-pay | Admitting: Gastroenterology

## 2020-09-20 ENCOUNTER — Telehealth: Payer: Self-pay

## 2020-09-20 ENCOUNTER — Ambulatory Visit (AMBULATORY_SURGERY_CENTER): Payer: BC Managed Care – PPO | Admitting: Gastroenterology

## 2020-09-20 ENCOUNTER — Other Ambulatory Visit: Payer: Self-pay

## 2020-09-20 ENCOUNTER — Encounter: Payer: Self-pay | Admitting: Gastroenterology

## 2020-09-20 VITALS — BP 106/57 | HR 88 | Temp 96.7°F | Resp 21 | Ht 62.0 in | Wt 168.0 lb

## 2020-09-20 DIAGNOSIS — K648 Other hemorrhoids: Secondary | ICD-10-CM | POA: Diagnosis not present

## 2020-09-20 DIAGNOSIS — K5730 Diverticulosis of large intestine without perforation or abscess without bleeding: Secondary | ICD-10-CM | POA: Diagnosis not present

## 2020-09-20 DIAGNOSIS — R1032 Left lower quadrant pain: Secondary | ICD-10-CM | POA: Diagnosis not present

## 2020-09-20 DIAGNOSIS — K625 Hemorrhage of anus and rectum: Secondary | ICD-10-CM | POA: Diagnosis not present

## 2020-09-20 MED ORDER — SODIUM CHLORIDE 0.9 % IV SOLN: 500.0000 mL | INTRAVENOUS | Status: DC

## 2020-09-20 NOTE — Progress Notes (Signed)
Lidocaine 2% 37ml IV given as per Dr. Lavon Paganini.

## 2020-09-20 NOTE — Progress Notes (Signed)
pt tolerated well. VSS. awake and to recovery. Report given to RN.  

## 2020-09-20 NOTE — Progress Notes (Signed)
Vitals by EW 

## 2020-09-20 NOTE — Telephone Encounter (Signed)
Patient called into clinic to confirm she can arrive today at 8am for a 9am procedure visit.  Appointment changed by RN.

## 2020-09-20 NOTE — Op Note (Signed)
[facility] Patient Name: Stacy Wilkins Procedure Date: 09/20/2020 8:14 AM MRN: [id number] Endoscopist: Napoleon Form , MD Age: 34 Referring MD:  Date of Birth: 1986-06-05 Gender: Female Account #: [id number] Procedure:                Colonoscopy Indications:              Abdominal pain in the left lower quadrant Medicines:                Monitored Anesthesia Care Procedure:                Pre-Anesthesia Assessment:                           - Prior to the procedure, a History and Physical                            was performed, and patient medications and                            allergies were reviewed. [name] tolerance of                            previous anesthesia was also reviewed. The risks                            and benefits of the procedure and the sedation                            options and risks were discussed with the patient.                            All questions were answered, and informed consent                            was obtained. Prior Anticoagulants: The patient has                            taken no previous anticoagulant or antiplatelet                            agents. ASA Grade Assessment: I - A normal, healthy                            patient. After reviewing the risks and benefits,                            the patient was deemed in satisfactory condition to                            undergo the procedure.                           After obtaining informed consent, the colonoscope  was passed under direct vision. Throughout the                            procedure, the patient's blood pressure, pulse, and                            oxygen saturations were monitored continuously. The                            Colonoscope was introduced through the anus and                            advanced to the the cecum, identified by                            appendiceal orifice and ileocecal  valve. The                            colonoscopy was performed without difficulty. The                            patient tolerated the procedure well. The quality                            of the bowel preparation was excellent. The                            ileocecal valve, appendiceal orifice, and rectum                            were photographed. Scope In: 8:43:32 AM Scope Out: 8:57:46 AM Scope Withdrawal Time: 0 hours 10 minutes 15 seconds  Total Procedure Duration: 0 hours 14 minutes 14 seconds  Findings:                 The perianal and digital rectal examinations were                            normal.                           Scattered small-mouthed diverticula were found in                            the sigmoid colon, descending colon and transverse                            colon.                           Non-bleeding internal hemorrhoids were found during                            retroflexion. The hemorrhoids were small. Complications:            No immediate complications. Estimated Blood Loss:     Estimated  blood loss: none. Impression:               - Mild diverticulosis in the sigmoid colon, in the                            descending colon and in the transverse colon.                           - Non-bleeding internal hemorrhoids.                           - No specimens collected. Recommendation:           - Patient has a contact number available for                            emergencies. The signs and symptoms of potential                            delayed complications were discussed with the                            patient. Return to normal activities tomorrow.                            Written discharge instructions were provided to the                            patient.                           - Resume previous diet.                           - Continue present medications.                           - Await pathology results.                            - Repeat colonoscopy in 10 years for screening                            purposes. Napoleon Form, MD 09/20/2020 9:06:18 AM This report has been signed electronically.

## 2020-09-20 NOTE — Patient Instructions (Signed)
Resume previous diet  continue present medications  await pathology results Repeat colonoscopy in 10 years   YOU HAD AN ENDOSCOPIC PROCEDURE TODAY AT THE [facility]:   Refer to the procedure report that was given to you for any specific questions about what was found during the examination.  If the procedure report does not answer your questions, please call your gastroenterologist to clarify.  If you requested that your care partner not be given the details of your procedure findings, then the procedure report has been included in a sealed envelope for you to review at your convenience later.  YOU SHOULD EXPECT: Some feelings of bloating in the abdomen. Passage of more gas than usual.  Walking can help get rid of the air that was put into your GI tract during the procedure and reduce the bloating. If you had a lower endoscopy (such as a colonoscopy or flexible sigmoidoscopy) you may notice spotting of blood in your stool or on the toilet paper. If you underwent a bowel prep for your procedure, you may not have a normal bowel movement for a few days.  Please Note:  You might notice some irritation and congestion in your nose or some drainage.  This is from the oxygen used during your procedure.  There is no need for concern and it should clear up in a day or so.  SYMPTOMS TO REPORT IMMEDIATELY:   Following lower endoscopy (colonoscopy or flexible sigmoidoscopy):  Excessive amounts of blood in the stool  Significant tenderness or worsening of abdominal pains  Swelling of the abdomen that is new, acute  Fever of 100F or higher  For urgent or emergent issues, a gastroenterologist can be reached at any hour by calling [phone]. Do not use MyChart messaging for urgent concerns.    DIET:  We do recommend a small meal at first, but then you may proceed to your regular diet.  Drink plenty of fluids but you should avoid alcoholic beverages for 24 hours.  ACTIVITY:  You should  plan to take it easy for the rest of today and you should NOT DRIVE or use heavy machinery until tomorrow (because of the sedation medicines used during the test).    FOLLOW UP: Our staff will call the number listed on your records 48-72 hours following your procedure to check on you and address any questions or concerns that you may have regarding the information given to you following your procedure. If we do not reach you, we will leave a message.  We will attempt to reach you two times.  During this call, we will ask if you have developed any symptoms of COVID 19. If you develop any symptoms (ie: fever, flu-like symptoms, shortness of breath, cough etc.) before then, please call [phone].  If you test positive for Covid 19 in the 2 weeks post procedure, please call and report this information to [place].    If any biopsies were taken you will be contacted by phone or by letter within the next 1-3 weeks.  Please call us at [phone] if you have not heard about the biopsies in 3 weeks.    SIGNATURES/CONFIDENTIALITY: You and/or your care partner have signed paperwork which will be entered into your electronic medical record.  These signatures attest to the fact that that the information above on your After Visit Summary has been reviewed and is understood.  Full responsibility of the confidentiality of this discharge information lies with you and/or your care-partner.

## 2020-09-22 ENCOUNTER — Telehealth: Payer: Self-pay | Admitting: *Deleted

## 2020-09-22 NOTE — Telephone Encounter (Signed)
1. Have you developed a fever since your procedure? no  2.   Have you had an respiratory symptoms (SOB or cough) since your procedure? no  3.   Have you tested positive for COVID 19 since your procedure no 4.   Have you had any family members/close contacts diagnosed with the COVID 19 since your procedure?  no   If yes to any of these questions please route to Laverna Peace, RN and Karlton Lemon, RN Follow up Call-  Call back number 09/20/2020 02/04/2018  Post procedure Call Back phone  # [phone]  Permission to leave phone message Yes Yes  Some recent data might be hidden     Patient questions:  Do you have a fever, pain , or abdominal swelling? No. Pain Score  0 *  Have you tolerated food without any problems? Yes.    Have you been able to return to your normal activities? Yes.    Do you have any questions about your discharge instructions: Diet   No. Medications  No. Follow up visit  No.  Do you have questions or concerns about your Care? No.  Actions: * If pain score is 4 or above: No action needed, pain <4.

## 2020-09-23 NOTE — Telephone Encounter (Signed)
Returned pharmacys call, explained to them they are not giving me the right code for [place] to do the prior auth  It keeps saying No Eligibility    We will try Linzess 72 mcg 90 days with a coupon card Spoke with pharmacy to try and get covered with coupon   Amitiza is $288 for 1 month   Coupon card does not work for patient cost 1,800 for [employer] patient and spoke with her and she said she tried  Linzess samples and they caused her dirrahea , she said ever since she's had her colonoscopy she has been fine and doesn't think she needs anything for constipation,  I told her that if she wants to try Amitiza samples to call me back and I would give her samples of that to try      Told her to purchase Omeprazole over the counter to take since Insurance also denied coverage for that as well

## 2020-09-23 NOTE — Telephone Encounter (Signed)
Shakevia called from [facility] asking if the Amitizia medication has been approved yet please advise

## 2020-09-27 NOTE — Telephone Encounter (Signed)
Baillie from [facility] is requesting a call back from a nurse to further discuss the pt's amitiza    CB [phone]

## 2020-10-03 ENCOUNTER — Ambulatory Visit: Payer: BC Managed Care – PPO | Admitting: Gastroenterology

## 2020-11-16 DIAGNOSIS — R7989 Other specified abnormal findings of blood chemistry: Secondary | ICD-10-CM | POA: Diagnosis not present

## 2020-11-16 DIAGNOSIS — M5481 Occipital neuralgia: Secondary | ICD-10-CM | POA: Diagnosis not present

## 2020-11-16 DIAGNOSIS — R739 Hyperglycemia, unspecified: Secondary | ICD-10-CM | POA: Diagnosis not present

## 2020-11-16 DIAGNOSIS — R202 Paresthesia of skin: Secondary | ICD-10-CM | POA: Diagnosis not present

## 2020-11-30 ENCOUNTER — Ambulatory Visit: Payer: BC Managed Care – PPO | Admitting: Student

## 2020-12-06 ENCOUNTER — Other Ambulatory Visit: Payer: Self-pay

## 2020-12-07 ENCOUNTER — Ambulatory Visit: Payer: BC Managed Care – PPO | Admitting: Student

## 2020-12-07 LAB — SARS CORONAVIRUS 2 (TAT 6-24 HRS): SARS Coronavirus 2: NEGATIVE

## 2021-01-11 DIAGNOSIS — R7989 Other specified abnormal findings of blood chemistry: Secondary | ICD-10-CM | POA: Diagnosis not present

## 2021-01-12 ENCOUNTER — Other Ambulatory Visit: Payer: Self-pay | Admitting: Gastroenterology

## 2021-01-12 DIAGNOSIS — K581 Irritable bowel syndrome with constipation: Secondary | ICD-10-CM

## 2021-01-12 DIAGNOSIS — R1032 Left lower quadrant pain: Secondary | ICD-10-CM

## 2021-02-07 NOTE — Progress Notes (Deleted)
[facility]    Provider:  Dr Lucia Gaskins Requesting Provider: Trisha Mangle, * Primary Care Provider:  Richmond Campbell., PA-C  CC:  ***  HPI:  Stacy Wilkins is a 35 y.o. female here as requested by Trisha Mangle, * for ***.  ***  Reviewed notes, labs and imaging from outside physicians, which showed ***   Mri brain 11/2019: Brain: The brain has a normal appearance without evidence of malformation, atrophy, old or acute small or large vessel infarction, mass lesion, hemorrhage, hydrocephalus or extra-axial collection. After contrast administration, no abnormal enhancement occurs. Although a detailed study was not ordered, no abnormality is seen affecting the trigeminal nerve or its branches.  Vascular: Major vessels at the base of the brain show flow. Venous sinuses appear patent.  Skull and upper cervical spine: Normal.  Sinuses/Orbits: Clear/normal.  Other: None significant.  IMPRESSION: Normal examination. No abnormality seen to explain the presenting symptoms.   Review of Systems: Patient complains of symptoms per HPI as well as the following symptoms ***. Pertinent negatives and positives per HPI. All others negative.   Social History   Socioeconomic History  . Marital status: Single    Spouse name: Not on file  . Number of children: 0  . Years of education: Not on file  . Highest education level: Not on file  Occupational History  . Occupation: Billing/Insurance    Comment: Nutritional therapist  Tobacco Use  . Smoking status: Former Smoker    Packs/day: 0.25    Years: 3.00    Pack years: 0.75    Quit date: 2014    Years since quitting: 8.2  . Smokeless tobacco: Never Used  . Tobacco comment: used to smoke socially  Vaping Use  . Vaping Use: Never used  Substance and Sexual Activity  . Alcohol use: No    Alcohol/week: 0.0 standard drinks  . Drug use: No  . Sexual activity: Not Currently    Birth  control/protection: Implant  Other Topics Concern  . Not on file  Social History Narrative  . Not on file   Social Determinants of Health   Financial Resource Strain: Not on file  Food Insecurity: Not on file  Transportation Needs: Not on file  Physical Activity: Not on file  Stress: Not on file  Social Connections: Not on file  Intimate Partner Violence: Not on file    Family History  Problem Relation Age of Onset  . Hypertension Mother   . Hypertension Father   . Prostate cancer Father   . Prostate cancer Brother   . Diabetes Maternal Uncle   . Colon cancer Neg Hx   . Liver cancer Neg Hx     Past Medical History:  Diagnosis Date  . Anxiety   . Depression   . DVT (deep venous thrombosis) (HCC)   . Generalized headaches   . GERD (gastroesophageal reflux disease)   . Hyperlipidemia   . Migraine   . Ulcer     Patient Active Problem List   Diagnosis Date Noted  . GERD (gastroesophageal reflux disease) 09/01/2020  . Anxiety 09/16/2017  . History of DVT (deep vein thrombosis) 03/21/2017  . History of pulmonary embolism 03/21/2017  . Food allergy 11/08/2016  . Palpitations 02/27/2016  . Hemorrhoids 09/29/2015    Past Surgical History:  Procedure Laterality Date  . bunionectomy     . EYE SURGERY    . TONSILLECTOMY AND ADENOIDECTOMY      Current Outpatient Medications  Medication Sig Dispense Refill  . doxycycline (VIBRAMYCIN) 100 MG capsule Take 100 mg by mouth daily.    . fluticasone (FLONASE) 50 MCG/ACT nasal spray SPRAY 2 SPRAYS INTO EACH NOSTRIL EVERY DAY (Patient taking differently: Place 2 sprays into both nostrils daily as needed for allergies or rhinitis. ) 16 g 1  . lubiprostone (AMITIZA) 24 MCG capsule Take 1 capsule (24 mcg total) by mouth 2 (two) times daily with a meal. 60 capsule 3  . Multiple Vitamins-Minerals (THERA-M) TABS Take 1 tablet by mouth daily.     Marland Kitchen omeprazole (PRILOSEC) 40 MG capsule TAKE 1 CAPSULE BY MOUTH EVERY DAY 90 capsule 3  .  valACYclovir (VALTREX) 500 MG tablet Take 1 tablet by mouth as needed.      No current facility-administered medications for this visit.    Allergies as of 02/08/2021 - Review Complete 09/20/2020  Allergen Reaction Noted  . Macrobid [nitrofurantoin] Palpitations 09/20/2020  . Esomeprazole magnesium Nausea And Vomiting 09/29/2015  . Onion Nausea And Vomiting 05/27/2017  . Other Nausea And Vomiting 05/27/2017    Vitals: There were no vitals taken for this visit. Last Weight:  Wt Readings from Last 1 Encounters:  09/20/20 168 lb (76.2 kg)   Last Height:   Ht Readings from Last 1 Encounters:  09/20/20 5\' 2"  (1.575 m)     Physical exam: Exam: Gen: NAD, conversant, well nourised, obese, well groomed                     CV: RRR, no MRG. No Carotid Bruits. No peripheral edema, warm, nontender Eyes: Conjunctivae clear without exudates or hemorrhage  Neuro: Detailed Neurologic Exam  Speech:    Speech is normal; fluent and spontaneous with normal comprehension.  Cognition:    The patient is oriented to person, place, and time;     recent and remote memory intact;     language fluent;     normal attention, concentration,     fund of knowledge Cranial Nerves:    The pupils are equal, round, and reactive to light. The fundi are normal and spontaneous venous pulsations are present. Visual fields are full to finger confrontation. Extraocular movements are intact. Trigeminal sensation is intact and the muscles of mastication are normal. The face is symmetric. The palate elevates in the midline. Hearing intact. Voice is normal. Shoulder shrug is normal. The tongue has normal motion without fasciculations.   Coordination:    Normal finger to nose and heel to shin. Normal rapid alternating movements.   Gait:    Heel-toe and tandem gait are normal.   Motor Observation:    No asymmetry, no atrophy, and no involuntary movements noted. Tone:    Normal muscle tone.    Posture:     Posture is normal. normal erect    Strength:    Strength is V/V in the upper and lower limbs.      Sensation: intact to LT     Reflex Exam:  DTR's:    Deep tendon reflexes in the upper and lower extremities are normal bilaterally.   Toes:    The toes are downgoing bilaterally.   Clonus:    Clonus is absent.    Assessment/Plan:    No orders of the defined types were placed in this encounter.  No orders of the defined types were placed in this encounter.   Cc: , *,  Trisha Mangle., PA-C  Richmond Campbell, MD  [facility] [id number]  [address] [place], [place] [address]  Phone [phone] Fax [phone]

## 2021-02-08 ENCOUNTER — Ambulatory Visit: Payer: BC Managed Care – PPO | Admitting: Neurology

## 2021-02-08 ENCOUNTER — Telehealth: Payer: Self-pay | Admitting: Neurology

## 2021-02-08 NOTE — Telephone Encounter (Signed)
Noted  

## 2021-02-08 NOTE — Telephone Encounter (Signed)
Pt called, cancelled appt, do not feel well.

## 2021-02-09 DIAGNOSIS — M26621 Arthralgia of right temporomandibular joint: Secondary | ICD-10-CM | POA: Diagnosis not present

## 2021-03-03 ENCOUNTER — Ambulatory Visit (HOSPITAL_BASED_OUTPATIENT_CLINIC_OR_DEPARTMENT_OTHER): Payer: BC Managed Care – PPO | Admitting: Family Medicine

## 2021-03-07 ENCOUNTER — Encounter (HOSPITAL_BASED_OUTPATIENT_CLINIC_OR_DEPARTMENT_OTHER): Payer: Self-pay | Admitting: Emergency Medicine

## 2021-03-07 ENCOUNTER — Emergency Department (HOSPITAL_BASED_OUTPATIENT_CLINIC_OR_DEPARTMENT_OTHER)
Admission: EM | Admit: 2021-03-07 | Discharge: 2021-03-07 | Disposition: A | Discharge status: Home / Self Care | Payer: BC Managed Care – PPO | Attending: Emergency Medicine | Admitting: Emergency Medicine

## 2021-03-07 ENCOUNTER — Emergency Department (HOSPITAL_BASED_OUTPATIENT_CLINIC_OR_DEPARTMENT_OTHER): Payer: BC Managed Care – PPO | Admitting: Radiology

## 2021-03-07 ENCOUNTER — Other Ambulatory Visit: Payer: Self-pay

## 2021-03-07 DIAGNOSIS — R0602 Shortness of breath: Secondary | ICD-10-CM | POA: Diagnosis not present

## 2021-03-07 DIAGNOSIS — R0789 Other chest pain: Secondary | ICD-10-CM | POA: Diagnosis not present

## 2021-03-07 DIAGNOSIS — M79601 Pain in right arm: Secondary | ICD-10-CM | POA: Insufficient documentation

## 2021-03-07 DIAGNOSIS — Z87891 Personal history of nicotine dependence: Secondary | ICD-10-CM | POA: Diagnosis not present

## 2021-03-07 DIAGNOSIS — R079 Chest pain, unspecified: Secondary | ICD-10-CM | POA: Diagnosis not present

## 2021-03-07 HISTORY — DX: Other pulmonary embolism without acute cor pulmonale: I26.99

## 2021-03-07 LAB — BASIC METABOLIC PANEL
Anion gap: 9 (ref 5–15)
BUN: 12 mg/dL (ref 6–20)
CO2: 22 mmol/L (ref 22–32)
Calcium: 9.3 mg/dL (ref 8.9–10.3)
Chloride: 106 mmol/L (ref 98–111)
Creatinine, Ser: 0.81 mg/dL (ref 0.44–1.00)
GFR, Estimated: >60 mL/min (ref >60)
Glucose, Bld: 94 mg/dL (ref 70–99)
Potassium: 3.9 mmol/L (ref 3.5–5.1)
Sodium: 137 mmol/L (ref 135–145)

## 2021-03-07 LAB — T4, FREE: Free T4: 0.99 ng/dL (ref 0.61–1.12)

## 2021-03-07 LAB — CBC
HCT: 43.9 % (ref 36.0–46.0)
Hemoglobin: 14.9 g/dL (ref 12.0–15.0)
MCH: 29.3 pg (ref 26.0–34.0)
MCHC: 33.9 g/dL (ref 30.0–36.0)
MCV: 86.2 fL (ref 80.0–100.0)
Platelets: 198 10*3/uL (ref 150–400)
RBC: 5.09 MIL/uL (ref 3.87–5.11)
RDW: 11.9 % (ref 11.5–15.5)
WBC: 6 10*3/uL (ref 4.0–10.5)
nRBC: 0 % (ref 0.0–0.2)

## 2021-03-07 LAB — PREGNANCY, URINE: Preg Test, Ur: NEGATIVE

## 2021-03-07 LAB — TROPONIN I (HIGH SENSITIVITY)
Troponin I (High Sensitivity): <2 ng/L (ref <18)
Troponin I (High Sensitivity): <2 ng/L (ref <18)

## 2021-03-07 LAB — TSH: TSH: 1.153 u[IU]/mL (ref 0.350–4.500)

## 2021-03-07 LAB — D-DIMER, QUANTITATIVE: D-Dimer, Quant: 0.27 ug/mL-FEU (ref 0.00–0.50)

## 2021-03-07 MED ORDER — SODIUM CHLORIDE 0.9 % IV BOLUS
1000.0000 mL | Freq: Once | INTRAVENOUS | Status: AC
  Administered 2021-03-07: 1000 mL via INTRAVENOUS

## 2021-03-07 MED ORDER — KETOROLAC TROMETHAMINE 30 MG/ML IJ SOLN
15.0000 mg | Freq: Once | INTRAMUSCULAR | Status: AC
  Administered 2021-03-07: 15 mg via INTRAVENOUS
  Filled 2021-03-07: qty 1

## 2021-03-07 NOTE — ED Triage Notes (Addendum)
Rt sided cp and central cp for  A few days  , states started with her neck , has seen a few drs unknow reason, some sob and nausea and dizzy , felt some palpitations  States sat night was bad , has had elevated TSH states dr is monitoring this

## 2021-03-07 NOTE — ED Notes (Signed)
Patient arrives for CP. Patient endorses shob, but is not visibly distress at this time. Patient placed on monitor and EKG/Vitals obtained. Patient denies hx of smoking or asthma; hx of DVT and PE. O2 100% on RA.

## 2021-03-07 NOTE — Discharge Instructions (Addendum)
Today's evaluation has been reassuring.  Your labs do not demonstrate evidence for ongoing issues with your heart, your aorta, pneumonia, blood clots.  Please be sure to continue taking her medication as directed and follow-up with your physician.  Thyroid testing studies been performed.  These results should be available in the coming days.  Return here for concerning changes.

## 2021-03-07 NOTE — ED Provider Notes (Signed)
[facility] EMERGENCY DEPT Provider Note   CSN: [id number] Arrival date & time: 03/07/21  0846     History Chief Complaint  Patient presents with  . Chest Pain    Stacy Wilkins is a 35 y.o. female.  HPI Patient with history of hyperthyroidism, PE/DVT 4 years ago, and ongoing neck pain now presents with new right arm pain and chest pain.  Neck pain has been an issue for some time she has been seen and evaluated multiple facilities.  However, over the past 3 days she has developed new pain radiating from her trapezius down the right arm, and right sternal discomfort.  Discomfort is persistent, sore, 6/10, not improved with OTC medication.  No vomiting, no fever, no syncope, no dyspnea. She notes that she has intermittent episodes of pain in the leg in which she was diagnosed with DVT several years ago, but no new swelling or other changes. She is no longer taking blood thinning medication, stopped about 6 months after her diagnosis.  Presumption was immobilization secondary to foot surgery contributing to her DVT. Patient is undergoing therapy for hyperthyroidism, but is not currently on medication.  Past Medical History:  Diagnosis Date  . Anxiety   . Depression   . DVT (deep venous thrombosis) (HCC)   . Generalized headaches   . GERD (gastroesophageal reflux disease)   . Hyperlipidemia   . Migraine   . PE (pulmonary thromboembolism) (HCC)   . Ulcer     Patient Active Problem List   Diagnosis Date Noted  . GERD (gastroesophageal reflux disease) 09/01/2020  . Anxiety 09/16/2017  . History of DVT (deep vein thrombosis) 03/21/2017  . History of pulmonary embolism 03/21/2017  . Food allergy 11/08/2016  . Palpitations 02/27/2016  . Hemorrhoids 09/29/2015    Past Surgical History:  Procedure Laterality Date  . bunionectomy     . EYE SURGERY    . TONSILLECTOMY AND ADENOIDECTOMY       OB History   No obstetric history on file.     Family History   Problem Relation Age of Onset  . Hypertension Mother   . Hypertension Father   . Prostate cancer Father   . Prostate cancer Brother   . Diabetes Maternal Uncle   . Colon cancer Neg Hx   . Liver cancer Neg Hx     Social History   Tobacco Use  . Smoking status: Former Smoker    Packs/day: 0.25    Years: 3.00    Pack years: 0.75    Quit date: 2014    Years since quitting: 8.2  . Smokeless tobacco: Never Used  . Tobacco comment: used to smoke socially  Vaping Use  . Vaping Use: Never used  Substance Use Topics  . Alcohol use: No    Alcohol/week: 0.0 standard drinks  . Drug use: No    Home Medications Prior to Admission medications   Medication Sig Start Date End Date Taking? Authorizing Provider  doxycycline (VIBRAMYCIN) 100 MG capsule Take 100 mg by mouth daily. 08/31/20   [provider]  fluticasone (FLONASE) 50 MCG/ACT nasal spray SPRAY 2 SPRAYS INTO EACH NOSTRIL EVERY DAY Patient taking differently: Place 2 sprays into both nostrils daily as needed for allergies or rhinitis.  04/18/18   Benjiman Core D, PA-C  lubiprostone (AMITIZA) 24 MCG capsule Take 1 capsule (24 mcg total) by mouth 2 (two) times daily with a meal. 09/08/20   Nandigam, Eleonore Chiquito, MD  Multiple Vitamins-Minerals (THERA-M) TABS Take  1 tablet by mouth daily.     [provider]  omeprazole (PRILOSEC) 40 MG capsule TAKE 1 CAPSULE BY MOUTH EVERY DAY 01/12/21   Napoleon Form, MD  valACYclovir (VALTREX) 500 MG tablet Take 1 tablet by mouth as needed.  08/27/19   [provider]    Allergies    Macrobid [nitrofurantoin], Esomeprazole magnesium, Onion, and Other  Review of Systems   Review of Systems  Constitutional:       Per HPI, otherwise negative  HENT:       Per HPI, otherwise negative  Respiratory:       Per HPI, otherwise negative  Cardiovascular:       Per HPI, otherwise negative  Gastrointestinal: Negative for vomiting.  Endocrine:       Negative aside from  HPI  Genitourinary:       Neg aside from HPI   Musculoskeletal:       Per HPI, otherwise negative  Skin: Negative.   Neurological: Negative for syncope.    Physical Exam Updated Vital Signs BP (!) 140/93 (BP Location: Right Arm)   Pulse (!) 103   Temp 98.3 F (36.8 C) (Oral)   Resp (!) 119   Ht 5\' 5"  (1.651 m)   Wt 72.6 kg   SpO2 98%   BMI 26.63 kg/m   Physical Exam Vitals and nursing note reviewed.  Constitutional:      General: She is not in acute distress.    Appearance: She is well-developed.  HENT:     Head: Normocephalic and atraumatic.  Eyes:     Conjunctiva/sclera: Conjunctivae normal.  Cardiovascular:     Rate and Rhythm: Normal rate and regular rhythm.  Pulmonary:     Effort: Pulmonary effort is normal. No respiratory distress.     Breath sounds: Normal breath sounds. No stridor.  Abdominal:     General: There is no distension.  Skin:    General: Skin is warm and dry.  Neurological:     Mental Status: She is alert and oriented to person, place, and time.     Cranial Nerves: No cranial nerve deficit.     ED Results / Procedures / Treatments   Labs (all labs ordered are listed, but only abnormal results are displayed) Labs Reviewed  CBC  BASIC METABOLIC PANEL  PREGNANCY, URINE  D-DIMER, QUANTITATIVE  TSH  T4, FREE  TROPONIN I (HIGH SENSITIVITY)    EKG EKG Interpretation  Date/Time:  Tuesday March 07 2021 08:54:57 EDT Ventricular Rate:  99 PR Interval:  132 QRS Duration: 83 QT Interval:  336 QTC Calculation: 432 R Axis:   56 Text Interpretation: Sinus rhythm Low voltage, precordial leads Baseline wander Otherwise within normal limits Confirmed by 08[birth year]1981 [phone]) on 03/07/2021 9:25:01 AM   Radiology DG Chest 2 View  Result Date: 03/07/2021 CLINICAL DATA:  Chest pain and shortness of breath EXAM: CHEST - 2 VIEW COMPARISON:  December 21, 2018 FINDINGS: Lungs are clear. Heart size and pulmonary vascularity are normal. No adenopathy.  No pneumothorax. No bone lesions. IMPRESSION: Lungs clear.  Cardiac silhouette normal. Electronically Signed   By: December 23, 2018 III M.D.   On: 03/07/2021 09:19    Procedures Procedures   Medications Ordered in ED Medications  sodium chloride 0.9 % bolus 1,000 mL (has no administration in time range)  ketorolac (TORADOL) 30 MG/ML injection 15 mg (has no administration in time range)    ED Course  I have reviewed the triage vital signs  and the nursing notes.  Pertinent labs & imaging results that were available during my care of the patient were reviewed by me and considered in my medical decision making (see chart for details).   Cardiac 90s sinus unremarkable Pulse ox 99% room air normal 12:30 PM Patient in no distress, awake, alert, speaking clearly.  No hypoxia, no hypotension, no tachycardia.  She notes that she feels somewhat better. She and I reviewed the findings, including initial results that are reassuring, 2 normal troponin, nonischemic EKG, negative D-dimer, and with low aortic dissection risk score, though she does have a history of PE, that was likely provoked, and today no evidence for ACS, PE, dissection, pneumonia, other acute new findings. Some suspicion for patient's ongoing pain issues in her neck contributing to today's episode versus gastritis.  Patient's improvement is further reassurance. Patient discharged in stable condition. MDM Rules/Calculators/A&P MDM Number of Diagnoses or Management Options Atypical chest pain: new, needed workup   Amount and/or Complexity of Data Reviewed Clinical lab tests: ordered and reviewed Tests in the radiology section of CPT: reviewed and ordered Tests in the medicine section of CPT: reviewed and ordered Decide to obtain previous medical records or to obtain history from someone other than the patient: yes Review and summarize past medical records: yes Independent visualization of images, tracings, or specimens:  yes  Risk of Complications, Morbidity, and/or Mortality Presenting problems: high Diagnostic procedures: high Management options: high  Critical Care Total time providing critical care: < 30 minutes  Patient Progress Patient progress: stable  Final Clinical Impression(s) / ED Diagnoses Final diagnoses:  Atypical chest pain     Gerhard Munch, MD 03/07/21 1231

## 2021-03-07 NOTE — Progress Notes (Deleted)
Primary Physician:  Richmond Campbell., PA-C   Patient ID: Stacy Wilkins, female    DOB: 04/14/1986, 35 y.o.   MRN: [id number]  Subjective:    No chief complaint on file.   HPI: Stacy Wilkins  is a 35 y.o. female  With history of left leg DVT and bilateral PE in April 2018 after foot surgery, she was on OCP which was discontinued. No recurrence.  Recently seen for acute visit for palpitations. She was placed on 7 day event monitor and now presents for follow up. Started on Propanolol at her last visit.   Since her last visit, she has been doing much better.  She felt that the neck pain that she was having at the time of her last appointment caused her palpitations and chest pain.  She has had complete resolution of chest pain.  Palpitations have significantly improved.  She is only using propanolol on occasion that does improve her symptoms.  She does admit that she has issues with anxiety, which she is not currently being treated for.   No shortness of breath, no PND orthopnea, no leg edema.  No hemoptysis, dizziness or syncope.  She has had frequent episodes of GERD/heartburn and has been on AcipHex on a daily basis.  Past Medical History:  Diagnosis Date  . Anxiety   . Depression   . DVT (deep venous thrombosis) (HCC)   . Generalized headaches   . GERD (gastroesophageal reflux disease)   . Hyperlipidemia   . Migraine   . PE (pulmonary thromboembolism) (HCC)   . Ulcer     Past Surgical History:  Procedure Laterality Date  . bunionectomy     . EYE SURGERY    . TONSILLECTOMY AND ADENOIDECTOMY      Social History   Socioeconomic History  . Marital status: Single    Spouse name: Not on file  . Number of children: 0  . Years of education: Not on file  . Highest education level: Not on file  Occupational History  . Occupation: Billing/Insurance    Comment: Nutritional therapist  Tobacco Use  . Smoking status: Former Smoker    Packs/day: 0.25    Years:  3.00    Pack years: 0.75    Quit date: 2014    Years since quitting: 8.2  . Smokeless tobacco: Never Used  . Tobacco comment: used to smoke socially  Vaping Use  . Vaping Use: Never used  Substance and Sexual Activity  . Alcohol use: No    Alcohol/week: 0.0 standard drinks  . Drug use: No  . Sexual activity: Not Currently    Birth control/protection: Implant  Other Topics Concern  . Not on file  Social History Narrative  . Not on file   Social Determinants of Health   Financial Resource Strain: Not on file  Food Insecurity: Not on file  Transportation Needs: Not on file  Physical Activity: Not on file  Stress: Not on file  Social Connections: Not on file  Intimate Partner Violence: Not on file   Review of Systems  Constitutional: Negative for decreased appetite, malaise/fatigue, weight gain and weight loss.  Eyes: Negative for visual disturbance.  Cardiovascular: Positive for palpitations (improved). Negative for chest pain, claudication, dyspnea on exertion, leg swelling, orthopnea and syncope.  Respiratory: Negative for hemoptysis and wheezing.   Endocrine: Negative for cold intolerance and heat intolerance.  Hematologic/Lymphatic: Does not bruise/bleed easily.  Skin: Negative for nail changes.  Musculoskeletal: Negative for muscle  weakness and myalgias.  Gastrointestinal: Positive for heartburn. Negative for abdominal pain, change in bowel habit, nausea and vomiting.  Neurological: Negative for difficulty with concentration, dizziness, focal weakness and headaches.  Psychiatric/Behavioral: Negative for altered mental status and suicidal ideas.  All other systems reviewed and are negative.  Objective:  There were no vitals taken for this visit. There is no height or weight on file to calculate BMI.    Physical Exam Vitals reviewed.  Constitutional:      Appearance: She is well-developed.  HENT:     Head: Normocephalic and atraumatic.  Cardiovascular:     Rate  and Rhythm: Normal rate and regular rhythm.     Pulses: Intact distal pulses.     Heart sounds: Normal heart sounds.  Pulmonary:     Effort: Pulmonary effort is normal. No accessory muscle usage or respiratory distress.     Breath sounds: Normal breath sounds.  Abdominal:     General: Bowel sounds are normal.     Palpations: Abdomen is soft.  Musculoskeletal:        General: Normal range of motion.     Cervical back: Normal range of motion.  Skin:    General: Skin is warm and dry.  Neurological:     Mental Status: She is alert and oriented to person, place, and time.    Radiology: DG Chest 2 View  Result Date: 03/07/2021 CLINICAL DATA:  Chest pain and shortness of breath EXAM: CHEST - 2 VIEW COMPARISON:  December 21, 2018 FINDINGS: Lungs are clear. Heart size and pulmonary vascularity are normal. No adenopathy. No pneumothorax. No bone lesions. IMPRESSION: Lungs clear.  Cardiac silhouette normal. Electronically Signed   By: Bretta Bang III M.D.   On: 03/07/2021 09:19    Laboratory examination:    CMP Latest Ref Rng & Units 03/07/2021 12/21/2018 02/01/2018  Glucose 70 - 99 mg/dL 94 601(U) 88  BUN 6 - 20 mg/dL 12 16 11   Creatinine 0.44 - 1.00 mg/dL 9.32 3.55  Sodium 135 - 145 mmol/L 137 138 139  Potassium 3.5 - 5.1 mmol/L 3.9 3.5 3.6  Chloride 98 - 111 mmol/L 106 109 105  CO2 22 - 32 mmol/L 22 21(L) 23  Calcium 8.9 - 10.3 mg/dL 9.3 7.32) 9.5  Total Protein 6.0 - 8.5 g/dL - - -  Total Bilirubin 0.0 - 1.2 mg/dL - - -  Alkaline Phos 39 - 117 IU/L - - -  AST 0 - 40 IU/L - - -  ALT 0 - 32 IU/L - - -   CBC Latest Ref Rng & Units 03/07/2021 12/21/2018 02/01/2018  WBC 4.0 - 10.5 K/uL 6.0 7.5 6.1  Hemoglobin 12.0 - 15.0 g/dL 04/03/2018 54.2 15.2(H)  Hematocrit 36.0 - 46.0 % 43.9 40.5 44.6  Platelets 150 - 400 K/uL 198 182 190   Lipid Panel     Component Value Date/Time   CHOL 155 09/27/2017 0919   TRIG 47 09/27/2017 0919   HDL 40 09/27/2017 0919   CHOLHDL 3.9 09/27/2017 0919    CHOLHDL 4 09/29/2015 0936   VLDL 15.2 09/29/2015 0936   LDLCALC 106 (H) 09/27/2017 0919   HEMOGLOBIN A1C Lab Results  Component Value Date   HGBA1C 4.9 02/06/2016   TSH No results for input(s): TSH in the last 8760 hours.  PRN Meds:. There are no discontinued medications. No outpatient medications have been marked as taking for the 03/08/21 encounter (Appointment) with 03/10/21, Degan Hanser C, PA-C.    Cardiac Studies:  Event Monitor for 7 days Start date 09/16/2019: [name] monitoring period was 09/16/2019 - 09/22/2019. Baseline sample showed Sinus Rhythm with a heart rate of 78.6 bpm. There were 0 critical, 0 serious, and 3 stable events that occurred. The report analysis of the critical, serious, stable and manually triggered events reveal NSR. There were rare PACs and PVCs.   Echocardiogram 10/06/2018: Left ventricle cavity is normal in size. Normal left ventricular shape. Normal global wall motion. Normal diastolic filling pattern. Calculated EF 58%. No hemodynamically significant valvular abnormality. Inadequate tricuspid regurgitation jet to estimate pulmonary artery pressure. Normal right atrial pressure.  Assessment:   No diagnosis found. EKG 09/16/2019: Normal sinus rhythm at rate of 85 bpm, normal axis.  No evidence of ischemia, normal EKG. No significant change from  EKG 06/09/2018   Recommendations:   Patient does have improvement in palpitations, felt that her episode was likely related to anxiety neck pain that she was experiencing at the time of her acute visit.  She has had the use of propranolol occasionally that does help her symptoms.  I discussed recently obtained event monitor that showed normal sinus rhythm with rare PAC and PVC.  I have reassured her.  Advised her that she is continue with B6 and B12 vitamins to help with her PVCs, work on controlling her stress, and decrease her caffeine intake.  I have encouraged her to consider evaluation and  management of her anxiety and stress testing to be contributing to a lot of her symptoms.  She is agreeable and will consider this.  As her symptoms have improved and she is overall been stable.  We will see her back on an as-needed basis.  Stacy Halsted, MSN, APRN, FNP-C [facility] [place] Office: [phone] Fax: [phone]

## 2021-03-08 ENCOUNTER — Ambulatory Visit: Payer: BC Managed Care – PPO | Admitting: Student

## 2021-03-08 ENCOUNTER — Ambulatory Visit (HOSPITAL_BASED_OUTPATIENT_CLINIC_OR_DEPARTMENT_OTHER): Payer: BC Managed Care – PPO | Admitting: Family Medicine

## 2021-03-15 ENCOUNTER — Ambulatory Visit: Payer: BC Managed Care – PPO | Admitting: Student

## 2021-03-15 ENCOUNTER — Encounter: Payer: Self-pay | Admitting: Student

## 2021-03-15 ENCOUNTER — Other Ambulatory Visit: Payer: Self-pay

## 2021-03-15 VITALS — BP 124/84 | HR 112 | Ht 65.0 in | Wt 167.0 lb

## 2021-03-15 DIAGNOSIS — R0600 Dyspnea, unspecified: Secondary | ICD-10-CM

## 2021-03-15 DIAGNOSIS — R072 Precordial pain: Secondary | ICD-10-CM

## 2021-03-15 DIAGNOSIS — R000 Tachycardia, unspecified: Secondary | ICD-10-CM | POA: Diagnosis not present

## 2021-03-15 DIAGNOSIS — R002 Palpitations: Secondary | ICD-10-CM | POA: Diagnosis not present

## 2021-03-15 DIAGNOSIS — R0609 Other forms of dyspnea: Secondary | ICD-10-CM | POA: Diagnosis not present

## 2021-03-15 MED ORDER — PROPRANOLOL HCL ER 60 MG PO CP24: 60.0000 mg | ORAL_CAPSULE | Freq: Every day | ORAL | 3 refills | Status: DC

## 2021-03-15 NOTE — Progress Notes (Signed)
Primary Physician:  Richmond Campbell., PA-C   Patient ID: Stacy Wilkins, female    DOB: 11-Jan-1986, 35 y.o.   MRN: [id number]  Subjective:    Chief Complaint  Patient presents with  . Palpitations  . Follow-up  . Chest Pain  . tingling in left hand/foot    HPI: Stacy Wilkins  is a 35 y.o. female  With history of left leg DVT and bilateral PE in April 2018 after foot surgery, she was on OCP which was discontinued. No recurrence.  Denies history of hypertension, hyperlipidemia, diabetes, family history of premature CAD.  Patient was last seen in our office by Stacy Stager, NP, on 10/08/2019 for follow-up of palpitations.  At that time cardiac event monitor revealed rare symptomatic PACs and PVCs.  Patient was recently evaluated emergency department 03/07/2021 with complaints of neck pain, right arm pain, and chest pain. Work up in the ED was unyielding.   Patient now presents for follow up with concerns of intermittent palpitations, intermittent precordial pain, and dyspnea on exertion over the last few weeks.  Patient states for the last 3 nights she has had symptoms of heart racing associated with a feeling of warmth to her entire body, and the symptoms reportedly last several hours and are associated with epigastric pain.  Patient also reports over the last few months she has been experiencing dyspnea on exertion.  Notably patient is relatively sedentary at baseline.  She has also been dealing with intermittent neck and right arm pain for the last 1 year.  Patient had previously been evaluated in our office for palpitations and started on propranolol as needed, she states present symptoms are similar to previous palpitations which correlated with PVCs and PACs on prior monitor.  However patient has not taken propranolol for these episodes.  In fact patient states she has not taken propranolol in an extended period of time.  Notably patient has been following with her PCP for  evaluation management of thyroid dysfunction, however TSH and free T4 during emergency department evaluation recently were within normal limits.  Patient also has a history of GERD for which she takes omeprazole.  Patient denies PND, orthopnea, leg edema, syncope, near syncope.  Patient also admits to relatively high levels of anxiety associated with her health.  Past Medical History:  Diagnosis Date  . Anxiety   . Depression   . DVT (deep venous thrombosis) (HCC)   . Generalized headaches   . GERD (gastroesophageal reflux disease)   . Hyperlipidemia   . Migraine   . PE (pulmonary thromboembolism) (HCC)   . Ulcer    Family History  Problem Relation Age of Onset  . Hypertension Mother   . Hypertension Father   . Prostate cancer Father   . Prostate cancer Brother   . Diabetes Maternal Uncle   . Colon cancer Neg Hx   . Liver cancer Neg Hx    Past Surgical History:  Procedure Laterality Date  . bunionectomy     . EYE SURGERY    . TONSILLECTOMY AND ADENOIDECTOMY     Social History   Tobacco Use  . Smoking status: Former Smoker    Packs/day: 0.25    Years: 3.00    Pack years: 0.75    Quit date: 2014    Years since quitting: 8.3  . Smokeless tobacco: Never Used  . Tobacco comment: used to smoke socially  Substance Use Topics  . Alcohol use: No    Alcohol/week: 0.0 standard  drinks   Marital Status: Single  Review of Systems  Constitutional: Negative for malaise/fatigue and weight gain.  Cardiovascular: Positive for chest pain, dyspnea on exertion and palpitations. Negative for claudication, leg swelling, near-syncope, orthopnea, paroxysmal nocturnal dyspnea and syncope.  Hematologic/Lymphatic: Does not bruise/bleed easily.  Gastrointestinal: Negative for melena.  Neurological: Negative for dizziness and weakness.   Objective:  Blood pressure 124/84, pulse (!) 112, height 5\' 5"  (1.651 m), weight 167 lb (75.8 kg), SpO2 98 %. Body mass index is 27.79 kg/m.    Physical  Exam Vitals reviewed.  Constitutional:      Appearance: She is well-developed.  HENT:     Head: Normocephalic and atraumatic.  Cardiovascular:     Rate and Rhythm: Regular rhythm. Tachycardia present.     Pulses: Intact distal pulses.          Carotid pulses are 2+ on the right side and 2+ on the left side.      Radial pulses are 2+ on the right side and 2+ on the left side.       Femoral pulses are 2+ on the right side and 2+ on the left side.      Popliteal pulses are 2+ on the right side and 2+ on the left side.       Dorsalis pedis pulses are 2+ on the right side and 2+ on the left side.       Posterior tibial pulses are 2+ on the right side and 2+ on the left side.     Heart sounds: Normal heart sounds, S1 normal and S2 normal. No murmur heard. No gallop.   Pulmonary:     Effort: Pulmonary effort is normal. No accessory muscle usage or respiratory distress.     Breath sounds: Normal breath sounds. No wheezing, rhonchi or rales.  Abdominal:     General: Bowel sounds are normal. There is no distension.     Palpations: Abdomen is soft.  Musculoskeletal:        General: Normal range of motion.     Cervical back: Normal range of motion.     Right lower leg: No edema.     Left lower leg: No edema.  Skin:    General: Skin is warm and dry.     Capillary Refill: Capillary refill takes less than 2 seconds.  Neurological:     General: No focal deficit present.     Mental Status: She is alert and oriented to person, place, and time.    Radiology: No results found.  Laboratory examination:    CMP Latest Ref Rng & Units 03/07/2021 12/21/2018 02/01/2018  Glucose 70 - 99 mg/dL 94 161(W109(H) 88  BUN 6 - 20 mg/dL 12 16 11   Creatinine 0.44 - 1.00 mg/dL 9.600.81 4.540.69 0.980.84  Sodium 135 - 145 mmol/L 137 138 139  Potassium 3.5 - 5.1 mmol/L 3.9 3.5 3.6  Chloride 98 - 111 mmol/L 106 109 105  CO2 22 - 32 mmol/L 22 21(L) 23  Calcium 8.9 - 10.3 mg/dL 9.3 1.1(B8.8(L) 9.5  Total Protein 6.0 - 8.5 g/dL - - -   Total Bilirubin 0.0 - 1.2 mg/dL - - -  Alkaline Phos 39 - 117 IU/L - - -  AST 0 - 40 IU/L - - -  ALT 0 - 32 IU/L - - -   CBC Latest Ref Rng & Units 03/07/2021 12/21/2018 02/01/2018  WBC 4.0 - 10.5 K/uL 6.0 7.5 6.1  Hemoglobin 12.0 - 15.0 g/dL 14.714.9 82.913.2  15.2(H)  Hematocrit 36.0 - 46.0 % 43.9 40.5 44.6  Platelets 150 - 400 K/uL 198 182 190   Lipid Panel     Component Value Date/Time   CHOL 155 09/27/2017 0919   TRIG 47 09/27/2017 0919   HDL 40 09/27/2017 0919   CHOLHDL 3.9 09/27/2017 0919   CHOLHDL 4 09/29/2015 0936   VLDL 15.2 09/29/2015 0936   LDLCALC 106 (H) 09/27/2017 0919   HEMOGLOBIN A1C Lab Results  Component Value Date   HGBA1C 4.9 02/06/2016   TSH Recent Labs    03/07/21 0858  TSH 1.153    Medications and Allergies    Allergies  Allergen Reactions  . Macrobid [Nitrofurantoin] Palpitations    Chest pain  . Esomeprazole Magnesium Nausea And Vomiting  . Bacitracin     Other reaction(s): Unknown  . Neosporin Plus Max St     Other reaction(s): Unknown  . Nickel Other (See Comments)  . Onion Nausea And Vomiting  . Other Nausea And Vomiting    Green peppers Other reaction(s): Unknown   Current Outpatient Medications  Medication Instructions  . doxycycline (VIBRAMYCIN) 100 mg, Oral, Daily  . etonogestrel (NEXPLANON) 68 MG IMPL implant See admin instructions  . fluticasone (FLONASE) 50 MCG/ACT nasal spray SPRAY 2 SPRAYS INTO EACH NOSTRIL EVERY DAY  . Multiple Vitamins-Minerals (THERA-M) TABS 1 tablet, Oral, Daily  . omeprazole (PRILOSEC) 40 MG capsule TAKE 1 CAPSULE BY MOUTH EVERY DAY  . propranolol ER (INDERAL LA) 60 mg, Oral, Daily  . valACYclovir (VALTREX) 500 MG tablet 1 tablet, Oral, As needed   Cardiac Studies:   Event Monitor for 7 days Start date 09/16/2019: [name] monitoring period was 09/16/2019 - 09/22/2019. Baseline sample showed Sinus Rhythm with a heart rate of 78.6 bpm. There were 0 critical, 0 serious, and 3 stable events that  occurred. The report analysis of the critical, serious, stable and manually triggered events reveal NSR. There were rare PACs and PVCs.   Echocardiogram 10/06/2018: Left ventricle cavity is normal in size. Normal left ventricular shape. Normal global wall motion. Normal diastolic filling pattern. Calculated EF 58%. No hemodynamically significant valvular abnormality. Inadequate tricuspid regurgitation jet to estimate pulmonary artery pressure. Normal right atrial pressure.   EKG   03/15/2021: Sinus rhythm at a rate of 100 bpm.  Normal axis.  No evidence of ischemia or underlying injury pattern.  Compared to EKG 09/16/2019, no significant change.  EKG 09/16/2019: Normal sinus rhythm at rate of 85 bpm, normal axis.  No evidence of ischemia, normal EKG. No significant change from  EKG 06/09/2018   Assessment:     ICD-10-CM   1. Palpitations  R00.2 EKG 12-Lead    LONG TERM MONITOR (3-14 DAYS)  2. Dyspnea on exertion  R06.00 LONG TERM MONITOR (3-14 DAYS)    PCV ECHOCARDIOGRAM COMPLETE    CT CARDIAC SCORING ([facility] LOCATIONS ONLY)    CANCELED: CT CARDIAC SCORING (SELF PAY ONLY)  3. Precordial pain  R07.2 PCV ECHOCARDIOGRAM COMPLETE    CT CARDIAC SCORING ([facility] LOCATIONS ONLY)    CANCELED: CT CARDIAC SCORING (SELF PAY ONLY)  4. Sinus tachycardia by electrocardiogram  R00.0 LONG TERM MONITOR (3-14 DAYS)   Meds ordered this encounter  Medications  . propranolol ER (INDERAL LA) 60 MG 24 hr capsule    Sig: Take 1 capsule (60 mg total) by mouth daily.    Dispense:  30 capsule    Refill:  3   Medications Discontinued During This Encounter  Medication Reason  .  lubiprostone (AMITIZA) 24 MCG capsule Error    Recommendations:  Stacy Wilkins  is a 35 y.o.female  With history of left leg DVT and bilateral PE in April 2018 after foot surgery, she was on OCP which was discontinued. No recurrence.  Patient was last seen in our office by Stacy Stager, NP, on 10/08/2019 for follow-up of  palpitations.  At that time cardiac event monitor revealed rare symptomatic PACs and PVCs.  Patient was recently evaluated emergency department 03/07/2021 with complaints of neck pain, right arm pain, and chest pain. Work up in the ED was unyielding.   Patient now presents for follow up.  Notably patient does not have significant cardiovascular risk factors for coronary artery disease.  However in view of symptoms of dyspnea on exertion, precordial pain, and palpitations shared decision was to proceed with further cardiac evaluation.  We will obtain coronary calcium score to further risk stratify patient from a coronary artery disease perspective.  We will also obtain echocardiogram in order to evaluate for underlying etiology of dyspnea on exertion.  In regard to palpitations, suspect present symptoms may be related to PVCs and PACs which were diagnosed on prior monitor.  However his frequency and severity of symptoms has increased recommend repeat ambulatory cardiac telemetry at this time.  Patient reports in the past that she had skin irritation with Zio patch monitor, and requests "sensitive skin "monitor adhesive.  I have advised her that I will notify our office.  As symptoms are suggestive of PACs and PVCs, will start patient on propranolol 60 mg once daily.  Follow-up in 8 weeks, sooner if needed, for results of cardiac testing and palpitations, dyspnea on exertion, precordial pain.   This was a 45-minute encounter with face-to-face counseling, medical records review, coordination of care, explanation of complex medical issues, complex medical decision making.     Rayford Halsted, PA-C 03/15/2021, 4:20 PM Office: [phone]

## 2021-04-03 ENCOUNTER — Ambulatory Visit: Payer: BC Managed Care – PPO | Admitting: Neurology

## 2021-04-04 DIAGNOSIS — B373 Candidiasis of vulva and vagina: Secondary | ICD-10-CM | POA: Diagnosis not present

## 2021-04-04 DIAGNOSIS — R102 Pelvic and perineal pain: Secondary | ICD-10-CM | POA: Diagnosis not present

## 2021-04-04 DIAGNOSIS — N898 Other specified noninflammatory disorders of vagina: Secondary | ICD-10-CM | POA: Diagnosis not present

## 2021-04-05 ENCOUNTER — Encounter (HOSPITAL_BASED_OUTPATIENT_CLINIC_OR_DEPARTMENT_OTHER): Payer: Self-pay | Admitting: Family Medicine

## 2021-04-06 ENCOUNTER — Telehealth: Payer: Self-pay | Admitting: Gastroenterology

## 2021-04-06 NOTE — Telephone Encounter (Signed)
Patient called has lower left pelvic pain and is also nauseous seeking advise.

## 2021-04-06 NOTE — Telephone Encounter (Addendum)
Spoke with patient, her appt has been rescheduled to tomorrow at 11 AM with Doug Sou, PA-C. Advised patient that if she has an increase in pain or worsening symptoms she will need to go to an urgent care or ED for evaluation. Patient verbalized understanding and had no concerns at the end of the call.  Cristela Felt, CMA has been notified that patient was added on.

## 2021-04-07 ENCOUNTER — Ambulatory Visit: Payer: BC Managed Care – PPO | Admitting: Gastroenterology

## 2021-04-07 ENCOUNTER — Other Ambulatory Visit: Payer: BC Managed Care – PPO

## 2021-04-12 ENCOUNTER — Ambulatory Visit: Payer: BC Managed Care – PPO

## 2021-04-12 ENCOUNTER — Other Ambulatory Visit: Payer: Self-pay

## 2021-04-12 DIAGNOSIS — R0609 Other forms of dyspnea: Secondary | ICD-10-CM | POA: Diagnosis not present

## 2021-04-12 DIAGNOSIS — R072 Precordial pain: Secondary | ICD-10-CM | POA: Diagnosis not present

## 2021-04-12 DIAGNOSIS — R0600 Dyspnea, unspecified: Secondary | ICD-10-CM

## 2021-04-26 ENCOUNTER — Other Ambulatory Visit: Payer: BC Managed Care – PPO

## 2021-04-26 ENCOUNTER — Ambulatory Visit: Payer: BC Managed Care – PPO | Admitting: Gastroenterology

## 2021-05-03 ENCOUNTER — Ambulatory Visit
Admission: RE | Admit: 2021-05-03 | Discharge: 2021-05-03 | Disposition: A | Discharge status: Home / Self Care | Payer: BC Managed Care – PPO | Source: Ambulatory Visit | Attending: Student | Admitting: Student

## 2021-05-03 ENCOUNTER — Other Ambulatory Visit: Payer: BC Managed Care – PPO

## 2021-05-03 DIAGNOSIS — R0600 Dyspnea, unspecified: Secondary | ICD-10-CM

## 2021-05-03 DIAGNOSIS — E785 Hyperlipidemia, unspecified: Secondary | ICD-10-CM | POA: Diagnosis not present

## 2021-05-03 DIAGNOSIS — R072 Precordial pain: Secondary | ICD-10-CM

## 2021-05-03 DIAGNOSIS — R0609 Other forms of dyspnea: Secondary | ICD-10-CM

## 2021-05-15 ENCOUNTER — Ambulatory Visit: Payer: BC Managed Care – PPO | Admitting: Neurology

## 2021-05-17 ENCOUNTER — Ambulatory Visit: Payer: BC Managed Care – PPO | Admitting: Neurology

## 2021-05-17 ENCOUNTER — Ambulatory Visit: Payer: BC Managed Care – PPO | Admitting: Gastroenterology

## 2021-05-24 DIAGNOSIS — M5000 Cervical disc disorder with myelopathy, unspecified cervical region: Secondary | ICD-10-CM | POA: Diagnosis not present

## 2021-05-30 DIAGNOSIS — M542 Cervicalgia: Secondary | ICD-10-CM | POA: Diagnosis not present

## 2021-06-01 DIAGNOSIS — M542 Cervicalgia: Secondary | ICD-10-CM | POA: Diagnosis not present

## 2021-06-06 ENCOUNTER — Other Ambulatory Visit: Payer: Self-pay | Admitting: Student

## 2021-06-20 DIAGNOSIS — M791 Myalgia, unspecified site: Secondary | ICD-10-CM | POA: Diagnosis not present

## 2021-06-20 DIAGNOSIS — M542 Cervicalgia: Secondary | ICD-10-CM | POA: Diagnosis not present

## 2021-06-21 ENCOUNTER — Ambulatory Visit: Payer: BC Managed Care – PPO | Admitting: Physician Assistant

## 2021-06-21 DIAGNOSIS — M9904 Segmental and somatic dysfunction of sacral region: Secondary | ICD-10-CM | POA: Diagnosis not present

## 2021-06-21 DIAGNOSIS — M542 Cervicalgia: Secondary | ICD-10-CM | POA: Diagnosis not present

## 2021-06-21 DIAGNOSIS — M9901 Segmental and somatic dysfunction of cervical region: Secondary | ICD-10-CM | POA: Diagnosis not present

## 2021-06-21 DIAGNOSIS — M9902 Segmental and somatic dysfunction of thoracic region: Secondary | ICD-10-CM | POA: Diagnosis not present

## 2021-06-22 DIAGNOSIS — M9904 Segmental and somatic dysfunction of sacral region: Secondary | ICD-10-CM | POA: Diagnosis not present

## 2021-06-22 DIAGNOSIS — M9902 Segmental and somatic dysfunction of thoracic region: Secondary | ICD-10-CM | POA: Diagnosis not present

## 2021-06-22 DIAGNOSIS — M9901 Segmental and somatic dysfunction of cervical region: Secondary | ICD-10-CM | POA: Diagnosis not present

## 2021-06-22 DIAGNOSIS — M542 Cervicalgia: Secondary | ICD-10-CM | POA: Diagnosis not present

## 2021-06-29 DIAGNOSIS — B373 Candidiasis of vulva and vagina: Secondary | ICD-10-CM | POA: Diagnosis not present

## 2021-06-29 DIAGNOSIS — N898 Other specified noninflammatory disorders of vagina: Secondary | ICD-10-CM | POA: Diagnosis not present

## 2021-06-29 DIAGNOSIS — R309 Painful micturition, unspecified: Secondary | ICD-10-CM | POA: Diagnosis not present

## 2021-07-18 DIAGNOSIS — M545 Low back pain, unspecified: Secondary | ICD-10-CM | POA: Diagnosis not present

## 2021-07-18 DIAGNOSIS — R1030 Lower abdominal pain, unspecified: Secondary | ICD-10-CM | POA: Diagnosis not present

## 2021-07-26 ENCOUNTER — Ambulatory Visit: Payer: BC Managed Care – PPO | Admitting: Physician Assistant

## 2021-08-09 DIAGNOSIS — Z87898 Personal history of other specified conditions: Secondary | ICD-10-CM | POA: Diagnosis not present

## 2021-08-09 DIAGNOSIS — Z8616 Personal history of COVID-19: Secondary | ICD-10-CM | POA: Diagnosis not present

## 2021-08-09 DIAGNOSIS — F411 Generalized anxiety disorder: Secondary | ICD-10-CM | POA: Diagnosis not present

## 2021-08-09 DIAGNOSIS — Z7689 Persons encountering health services in other specified circumstances: Secondary | ICD-10-CM | POA: Diagnosis not present

## 2021-08-16 DIAGNOSIS — L249 Irritant contact dermatitis, unspecified cause: Secondary | ICD-10-CM | POA: Diagnosis not present

## 2021-08-16 DIAGNOSIS — D485 Neoplasm of uncertain behavior of skin: Secondary | ICD-10-CM | POA: Diagnosis not present

## 2021-08-16 DIAGNOSIS — L810 Postinflammatory hyperpigmentation: Secondary | ICD-10-CM | POA: Diagnosis not present

## 2021-08-16 DIAGNOSIS — D225 Melanocytic nevi of trunk: Secondary | ICD-10-CM | POA: Diagnosis not present

## 2021-08-31 ENCOUNTER — Telehealth: Payer: Self-pay | Admitting: *Deleted

## 2021-08-31 NOTE — Telephone Encounter (Signed)
Patient is calling because her left foot, toe(previous surgery),were screw was placed is very painful , pain going down into the sole of the foot , possibly swollen, today is worse. Please schedule for appointment, has not been seen since 2018.

## 2021-09-01 NOTE — Telephone Encounter (Signed)
Returned the call to patient, no answer, left vmessage of Dr Wynema Birch recommendations until she is able to been seen in office and that someone will contact her to schedule appointment.

## 2021-10-11 ENCOUNTER — Other Ambulatory Visit: Payer: Self-pay | Admitting: Student

## 2021-10-16 DIAGNOSIS — Z0000 Encounter for general adult medical examination without abnormal findings: Secondary | ICD-10-CM | POA: Diagnosis not present

## 2021-10-18 DIAGNOSIS — R102 Pelvic and perineal pain: Secondary | ICD-10-CM | POA: Diagnosis not present

## 2021-10-18 DIAGNOSIS — Z118 Encounter for screening for other infectious and parasitic diseases: Secondary | ICD-10-CM | POA: Diagnosis not present

## 2021-10-18 DIAGNOSIS — Z23 Encounter for immunization: Secondary | ICD-10-CM | POA: Diagnosis not present

## 2021-10-18 DIAGNOSIS — Z113 Encounter for screening for infections with a predominantly sexual mode of transmission: Secondary | ICD-10-CM | POA: Diagnosis not present

## 2021-10-18 DIAGNOSIS — Z0000 Encounter for general adult medical examination without abnormal findings: Secondary | ICD-10-CM | POA: Diagnosis not present

## 2021-10-18 DIAGNOSIS — Z01419 Encounter for gynecological examination (general) (routine) without abnormal findings: Secondary | ICD-10-CM | POA: Diagnosis not present

## 2021-10-18 DIAGNOSIS — Z1151 Encounter for screening for human papillomavirus (HPV): Secondary | ICD-10-CM | POA: Diagnosis not present

## 2021-10-25 DIAGNOSIS — L814 Other melanin hyperpigmentation: Secondary | ICD-10-CM | POA: Diagnosis not present

## 2021-10-25 DIAGNOSIS — L578 Other skin changes due to chronic exposure to nonionizing radiation: Secondary | ICD-10-CM | POA: Diagnosis not present

## 2021-10-25 DIAGNOSIS — D1801 Hemangioma of skin and subcutaneous tissue: Secondary | ICD-10-CM | POA: Diagnosis not present

## 2021-10-25 DIAGNOSIS — D2272 Melanocytic nevi of left lower limb, including hip: Secondary | ICD-10-CM | POA: Diagnosis not present

## 2021-10-30 DIAGNOSIS — M549 Dorsalgia, unspecified: Secondary | ICD-10-CM | POA: Diagnosis not present

## 2021-11-07 DIAGNOSIS — L0390 Cellulitis, unspecified: Secondary | ICD-10-CM | POA: Diagnosis not present

## 2021-11-15 DIAGNOSIS — M545 Low back pain, unspecified: Secondary | ICD-10-CM | POA: Diagnosis not present

## 2021-11-15 DIAGNOSIS — R635 Abnormal weight gain: Secondary | ICD-10-CM | POA: Diagnosis not present

## 2021-11-15 DIAGNOSIS — Z22322 Carrier or suspected carrier of Methicillin resistant Staphylococcus aureus: Secondary | ICD-10-CM | POA: Diagnosis not present

## 2021-11-17 DIAGNOSIS — R102 Pelvic and perineal pain: Secondary | ICD-10-CM | POA: Diagnosis not present

## 2021-12-27 ENCOUNTER — Ambulatory Visit: Payer: BC Managed Care – PPO | Admitting: Physical Therapy

## 2022-01-18 ENCOUNTER — Ambulatory Visit: Payer: BC Managed Care – PPO | Admitting: Gastroenterology

## 2022-01-22 DIAGNOSIS — F410 Panic disorder [episodic paroxysmal anxiety] without agoraphobia: Secondary | ICD-10-CM | POA: Diagnosis not present

## 2022-01-22 DIAGNOSIS — M79606 Pain in leg, unspecified: Secondary | ICD-10-CM | POA: Diagnosis not present

## 2022-01-22 DIAGNOSIS — F411 Generalized anxiety disorder: Secondary | ICD-10-CM | POA: Diagnosis not present

## 2022-01-22 DIAGNOSIS — Z309 Encounter for contraceptive management, unspecified: Secondary | ICD-10-CM | POA: Diagnosis not present

## 2022-01-25 DIAGNOSIS — M79605 Pain in left leg: Secondary | ICD-10-CM | POA: Diagnosis not present

## 2022-02-06 NOTE — Progress Notes (Signed)
? ?Primary Physician:  Lewis Moccasin, MD ? ? ?Patient ID: Stacy Wilkins, female    DOB: 13-Jul-1986, 36 y.o.   MRN: [id number] ? ?Subjective:  ? ? ?Chief Complaint  ?Patient presents with  ? Palpitations  ? Follow-up  ? ? ?HPI: Stacy Wilkins  is a 36 y.o. female  With history of left leg DVT and bilateral PE in April 2018 after foot surgery, she was on OCP which was discontinued. No recurrence.  Denies history of hypertension, hyperlipidemia, diabetes, family history of premature CAD. ? ?Patient was last seen in our office 03/15/2021 at which time she was advised to follow-up in 8 weeks, she has unfortunately been lost to follow-up until now.  At last office visit ordered coronary calcium score and Zio patch monitor, as well as started patient on propranolol 60 mg p.o. daily.  Coronary calcium score was 0, cardiac monitor has not been done, and echocardiogram revealed normal LVEF without significant abnormalities.  ? ?Patient presents now for follow-up with concerns of 2 weeks of increased palpitations.  Over the last 1 week this has improved, however palpitations are lasting longer and occurring more frequently for about 2 weeks despite taking propranolol as prescribed. ? ?Patient also admits to relatively high levels of anxiety associated with her health. ? ?Past Medical History:  ?Diagnosis Date  ? Anxiety   ? Depression   ? DVT (deep venous thrombosis) (HCC)   ? Generalized headaches   ? GERD (gastroesophageal reflux disease)   ? Hyperlipidemia   ? Migraine   ? PE (pulmonary thromboembolism) (HCC)   ? Ulcer   ? ?Family History  ?Problem Relation Age of Onset  ? Hypertension Mother   ? Hypertension Father   ? Prostate cancer Father   ? Prostate cancer Brother   ? Diabetes Maternal Uncle   ? Colon cancer Neg Hx   ? Liver cancer Neg Hx   ? ?Past Surgical History:  ?Procedure Laterality Date  ? bunionectomy     ? EYE SURGERY    ? TONSILLECTOMY AND ADENOIDECTOMY    ? ?Social History  ? ?Tobacco Use  ?  Smoking status: Former  ?  Packs/day: 0.25  ?  Years: 3.00  ?  Pack years: 0.75  ?  Types: Cigarettes  ?  Quit date: 2014  ?  Years since quitting: 9.2  ? Smokeless tobacco: Never  ? Tobacco comments:  ?  used to smoke socially  ?Substance Use Topics  ? Alcohol use: No  ?  Alcohol/week: 0.0 standard drinks  ? ?Marital Status: Single ? ?Review of Systems  ?Constitutional: Negative for malaise/fatigue and weight gain.  ?Cardiovascular:  Positive for palpitations. Negative for chest pain, claudication, dyspnea on exertion, leg swelling, near-syncope, orthopnea, paroxysmal nocturnal dyspnea and syncope.  ?Hematologic/Lymphatic: Does not bruise/bleed easily.  ?Gastrointestinal:  Negative for melena.  ?Neurological:  Negative for dizziness and weakness.  ?Objective:  ?Blood pressure 115/80, pulse 76, temperature 98 ?F (36.7 ?C), resp. rate 16, height 5\' 5"  (1.651 m), weight 175 lb (79.4 kg), SpO2 96 %. Body mass index is 29.12 kg/m?. ?   ?Physical Exam ?Vitals reviewed.  ?Constitutional:   ?   Appearance: She is well-developed.  ?Cardiovascular:  ?   Rate and Rhythm: Normal rate and regular rhythm.  ?   Pulses: Intact distal pulses.     ?     Carotid pulses are 2+ on the right side and 2+ on the left side. ?  Radial pulses are 2+ on the right side and 2+ on the left side.  ?     Femoral pulses are 2+ on the right side and 2+ on the left side. ?     Popliteal pulses are 2+ on the right side and 2+ on the left side.  ?     Dorsalis pedis pulses are 2+ on the right side and 2+ on the left side.  ?     Posterior tibial pulses are 2+ on the right side and 2+ on the left side.  ?   Heart sounds: Normal heart sounds, S1 normal and S2 normal. No murmur heard. ?  No gallop.  ?Pulmonary:  ?   Effort: Pulmonary effort is normal. No accessory muscle usage or respiratory distress.  ?   Breath sounds: Normal breath sounds. No wheezing, rhonchi or rales.  ?Musculoskeletal:     ?   General: Normal range of motion.  ?   Cervical back:  Normal range of motion.  ?   Right lower leg: No edema.  ?   Left lower leg: No edema.  ? ?Laboratory examination:  ? ?CMP Latest Ref Rng & Units 03/07/2021 12/21/2018 02/01/2018  ?Glucose 70 - 99 mg/dL 94 989(Q) 88  ?BUN 6 - 20 mg/dL 12 16 11   ?Creatinine 0.44 - 1.00 mg/dL 1.19 4.17  ?Sodium 135 - 145 mmol/L 137 138 139  ?Potassium 3.5 - 5.1 mmol/L 3.9 3.5 3.6  ?Chloride 98 - 111 mmol/L 106 109 105  ?CO2 22 - 32 mmol/L 22 21(L) 23  ?Calcium 8.9 - 10.3 mg/dL 9.3 4.08) 9.5  ?Total Protein 6.0 - 8.5 g/dL - - -  ?Total Bilirubin 0.0 - 1.2 mg/dL - - -  ?Alkaline Phos 39 - 117 IU/L - - -  ?AST 0 - 40 IU/L - - -  ?ALT 0 - 32 IU/L - - -  ? ?CBC Latest Ref Rng & Units 03/07/2021 12/21/2018 02/01/2018  ?WBC 4.0 - 10.5 K/uL 6.0 7.5 6.1  ?Hemoglobin 12.0 - 15.0 g/dL 04/03/2018 81.8 15.2(H)  ?Hematocrit 36.0 - 46.0 % 43.9 40.5 44.6  ?Platelets 150 - 400 K/uL 198 182 190  ? ?Lipid Panel  ?   ?Component Value Date/Time  ? CHOL 155 09/27/2017 0919  ? TRIG 47 09/27/2017 0919  ? HDL 40 09/27/2017 0919  ? CHOLHDL 3.9 09/27/2017 0919  ? CHOLHDL 4 09/29/2015 0936  ? VLDL 15.2 09/29/2015 0936  ? LDLCALC 106 (H) 09/27/2017 0919  ? ?HEMOGLOBIN A1C ?Lab Results  ?Component Value Date  ? HGBA1C 4.9 02/06/2016  ? ?TSH ?Recent Labs  ?  03/07/21 ?0858  ?TSH 1.153  ? ?Allergies  ? ?Allergies  ?Allergen Reactions  ? Macrobid [Nitrofurantoin] Palpitations  ?  Chest pain  ? Esomeprazole Magnesium Nausea And Vomiting  ? Bacitracin   ?  Other reaction(s): Unknown  ? Neosporin Plus Max St   ?  Other reaction(s): Unknown  ? Nickel Other (See Comments)  ? Onion Nausea And Vomiting  ? Other Nausea And Vomiting  ?  Green peppers ?Other reaction(s): Unknown  ?  ?Medications Prior to Visit:  ? ?Outpatient Medications Prior to Visit  ?Medication Sig Dispense Refill  ? doxycycline (VIBRAMYCIN) 100 MG capsule Take 100 mg by mouth daily.    ? etonogestrel (NEXPLANON) 68 MG IMPL implant See admin instructions.    ? fluticasone (FLONASE) 50 MCG/ACT nasal spray SPRAY 2  SPRAYS INTO EACH NOSTRIL EVERY DAY (Patient taking differently: Place 2 sprays  into both nostrils daily as needed for allergies or rhinitis.) 16 g 1  ? Multiple Vitamins-Minerals (THERA-M) TABS Take 1 tablet by mouth daily.     ? omeprazole (PRILOSEC) 40 MG capsule TAKE 1 CAPSULE BY MOUTH EVERY DAY 90 capsule 3  ? propranolol ER (INDERAL LA) 60 MG 24 hr capsule TAKE 1 CAPSULE BY MOUTH EVERY DAY 90 capsule 1  ? valACYclovir (VALTREX) 500 MG tablet Take 1 tablet by mouth as needed.     ? busPIRone (BUSPAR) 10 MG tablet Take 10-20 mg by mouth 2 (two) times daily as needed.    ? sertraline (ZOLOFT) 25 MG tablet Take by mouth.    ? ?No facility-administered medications prior to visit.  ? ?Final Medications at End of Visit   ? ?Current Meds  ?Medication Sig  ? doxycycline (VIBRAMYCIN) 100 MG capsule Take 100 mg by mouth daily.  ? etonogestrel (NEXPLANON) 68 MG IMPL implant See admin instructions.  ? fluticasone (FLONASE) 50 MCG/ACT nasal spray SPRAY 2 SPRAYS INTO EACH NOSTRIL EVERY DAY (Patient taking differently: Place 2 sprays into both nostrils daily as needed for allergies or rhinitis.)  ? Multiple Vitamins-Minerals (THERA-M) TABS Take 1 tablet by mouth daily.   ? omeprazole (PRILOSEC) 40 MG capsule TAKE 1 CAPSULE BY MOUTH EVERY DAY  ? propranolol ER (INDERAL LA) 60 MG 24 hr capsule TAKE 1 CAPSULE BY MOUTH EVERY DAY  ? valACYclovir (VALTREX) 500 MG tablet Take 1 tablet by mouth as needed.   ? ?Cardiac Studies:  ?Coronary calcium score 05/03/2021: ?Coronary calcium score of 0. ? ?Echocardiogram 04/12/2021:  ?Normal LV systolic function with visual EF 60-65%. Left ventricle cavity  ?is normal in size. Normal global wall motion. Normal diastolic filling  ?pattern, normal LAP.  ?No significant valvular abnormalities.  ?Compared to study dated 10/06/2018: no significant change.   ? ?Event Monitor for 7 days Start date 09/16/2019: ?[name] monitoring period was 09/16/2019 - 09/22/2019. Baseline sample showed Sinus  Rhythm with a heart rate of 78.6 bpm. There were 0 critical, 0 serious, and 3 stable events that occurred. The report analysis of the critical, serious, stable and manually triggered events reveal NSR. There were

## 2022-02-07 ENCOUNTER — Ambulatory Visit: Payer: BC Managed Care – PPO | Admitting: Student

## 2022-02-07 ENCOUNTER — Encounter: Payer: Self-pay | Admitting: Student

## 2022-02-07 ENCOUNTER — Other Ambulatory Visit: Payer: Self-pay

## 2022-02-07 VITALS — BP 115/80 | HR 76 | Temp 98.0°F | Resp 16 | Ht 65.0 in | Wt 175.0 lb

## 2022-02-07 DIAGNOSIS — R002 Palpitations: Secondary | ICD-10-CM | POA: Diagnosis not present

## 2022-02-09 ENCOUNTER — Inpatient Hospital Stay: Payer: BC Managed Care – PPO

## 2022-02-09 DIAGNOSIS — R002 Palpitations: Secondary | ICD-10-CM

## 2022-02-19 DIAGNOSIS — M542 Cervicalgia: Secondary | ICD-10-CM | POA: Diagnosis not present

## 2022-02-19 DIAGNOSIS — Z86711 Personal history of pulmonary embolism: Secondary | ICD-10-CM | POA: Diagnosis not present

## 2022-02-19 DIAGNOSIS — M25511 Pain in right shoulder: Secondary | ICD-10-CM | POA: Diagnosis not present

## 2022-03-21 ENCOUNTER — Ambulatory Visit: Payer: BC Managed Care – PPO | Admitting: Student

## 2022-03-21 DIAGNOSIS — N760 Acute vaginitis: Secondary | ICD-10-CM | POA: Diagnosis not present

## 2022-03-21 DIAGNOSIS — B3731 Acute candidiasis of vulva and vagina: Secondary | ICD-10-CM | POA: Diagnosis not present

## 2022-03-21 DIAGNOSIS — N3000 Acute cystitis without hematuria: Secondary | ICD-10-CM | POA: Diagnosis not present

## 2022-03-28 ENCOUNTER — Ambulatory Visit: Payer: BC Managed Care – PPO | Admitting: Student

## 2022-04-11 ENCOUNTER — Other Ambulatory Visit: Payer: Self-pay

## 2022-04-11 ENCOUNTER — Ambulatory Visit: Payer: BC Managed Care – PPO | Admitting: Nurse Practitioner

## 2022-04-11 ENCOUNTER — Encounter: Payer: Self-pay | Admitting: Nurse Practitioner

## 2022-04-11 VITALS — BP 100/70 | HR 97 | Ht 65.0 in | Wt 181.0 lb

## 2022-04-11 DIAGNOSIS — K625 Hemorrhage of anus and rectum: Secondary | ICD-10-CM

## 2022-04-11 DIAGNOSIS — K649 Unspecified hemorrhoids: Secondary | ICD-10-CM | POA: Diagnosis not present

## 2022-04-11 NOTE — Progress Notes (Signed)
CPR

## 2022-04-11 NOTE — Patient Instructions (Addendum)
You have been scheduled for a appointment with Dr. Lavon Paganini on 05/09/22 at 3:50pm.  ? ?Use may use Glycerin Suppositories as needed for your hemorrhoids and Preparation H cream for external hemorrhoids as needed.  ? ?Start Benefiber -  ?A high fiber diet with plenty of fluids (up to 8 glasses of water daily) is suggested to relieve these symptoms. Benefiber 1 tablespoon once or twice daily can be used to keep bowels regular if needed. ?    ? ?If you are age 36 or older, your body mass index should be between 23-30. Your Body mass index is 30.12 kg/m?Marland Kitchen If this is out of the aforementioned range listed, please consider follow up with your Primary Care Provider. ? ?If you are age 75 or younger, your body mass index should be between 19-25. Your Body mass index is 30.12 kg/m?Marland Kitchen If this is out of the aformentioned range listed, please consider follow up with your Primary Care Provider.  ? ?________________________________________________________ ? ?The [facility] providers would like to encourage you to use [facility] to communicate with providers for non-urgent requests or questions.  Due to long hold times on the telephone, sending your provider a message by [facility] may be a faster and more efficient way to get a response.  Please allow 48 business hours for a response.  Please remember that this is for non-urgent requests.  ?_______________________________________________________ ? ?Thank you for choosing me and [facility]. ? ?Willette Cluster NP  ? ? ? ?

## 2022-04-11 NOTE — Progress Notes (Signed)
? ? ?Assessment  ? ?Patient Profile:  ?Stacy Wilkins is a 36 y.o. female known to Dr. Silverio Decamp with a past medical history of hemorrhoids, IBS, chronic abdominal pain, constipation, PUD, post-op DVT / bilateral PE, anxiety. Additional medical history as listed in [facility] . ? ?Chronic constipation ?Decreased frequency and sometimes difficult evacuation. Didn't tolerate Linzess. Taking Miralax almost everyday but still not having a daily BM and evacuation can be difficult.  ? ?Persistent hemorrhoidal bleeding.  ?Recent complete colonoscopy ( for evaluation of bleeding) was negative except for internal hemorrhoids ? ?Plan  ? ?Will try to get a little more aggressive with bowel regimen ?Didn't tolerate Linzess ?Continue daily Miralax  ?Agrees to add Benefiber BID ?Avoid prolonged sitting on toilet. Can use glycerin suppositories as needed.  ?Will schedule for internal hemorrhoid banding. Procedure discussed, pamphlet given. She understands that we are not able to band external hemorrhoids. Therefore banding can help the bleeding but may not help this discomfort. She has Recticare.   ?For external hemorrhoid causing discomfort she will try Prep H cream.  ? ?HPI  ? ?Chief Complaint : Persistent rectal bleeding ? ?Palmina was last seen Oct 2021 for evaluation of ongoing LLQ pain, intermittent rectal bleeding felt to be from internal hemorrhoids.  She was started on Linzess.  Scheduled for colonoscopy which was unremarkable except for hemorrhoids and mild diverticulosis ? ?Deltha has rectal bleeding and rectal discomfort every time she has a BM. She couldn't tolerate Linzess. She is using Miralax almost every day but still has days when feels constipated. She has never tried a fiber supplement. For hemorrhoids she hasn't ever tried topical steroid cream for the hemorrhoids but says Dr. Silverio Decamp did talk with her about banding and she is interested.   ? ? ?Previous GI Evaluation  ?Oct 2021 colonoscopy ?- Mild diverticulosis  in the sigmoid colon, in the descending colon and in the transverse colon. ?- Non-bleeding internal hemorrhoids. ?- No specimens collecte ? ? ? ?Labs:  ? ?  Latest Ref Rng & Units 03/07/2021  ?  8:58 AM 12/21/2018  ?  3:44 AM 02/01/2018  ? 10:55 AM  ?CBC  ?WBC 4.0 - 10.5 K/uL 6.0   7.5   6.1    ?Hemoglobin 12.0 - 15.0 g/dL 14.9   13.2   15.2    ?Hematocrit 36.0 - 46.0 % 43.9   40.5   44.6    ?Platelets 150 - 400 K/uL 198   182   190    ? ? ? ?  Latest Ref Rng & Units 09/27/2017  ?  9:19 AM 03/19/2017  ?  6:17 PM 02/06/2016  ?  2:10 PM  ?Hepatic Function  ?Total Protein 6.0 - 8.5 g/dL 6.7   7.0   6.3    ?Albumin 3.5 - 5.5 g/dL 4.5   4.8   4.1    ?AST 0 - 40 IU/L $Remov'14   26   14    'YPKdzi$ ?ALT 0 - 32 IU/L $Remov'10   26   31    'rodqrA$ ?Alk Phosphatase 39 - 117 IU/L 58   69   48    ?Total Bilirubin 0.0 - 1.2 mg/dL 0.6   0.3   0.5    ? ? ? ?Past Medical History:  ?Diagnosis Date  ? Anxiety   ? Depression   ? DVT (deep venous thrombosis) ([place])   ? Generalized headaches   ? GERD (gastroesophageal reflux disease)   ? Hyperlipidemia   ? Migraine   ?  PE (pulmonary thromboembolism) ([place])   ? Ulcer   ? ? ?Past Surgical History:  ?Procedure Laterality Date  ? bunionectomy     ? EYE SURGERY    ? TONSILLECTOMY AND ADENOIDECTOMY    ? ? ?Current Medications, Allergies, Family History and Social History were reviewed in [employer] record. ?  ?  ?Current Outpatient Medications  ?Medication Sig Dispense Refill  ? doxycycline (VIBRAMYCIN) 100 MG capsule Take 100 mg by mouth daily.    ? etonogestrel (NEXPLANON) 76 MG IMPL implant See admin instructions.    ? fluticasone (FLONASE) 50 MCG/ACT nasal spray SPRAY 2 SPRAYS INTO EACH NOSTRIL EVERY DAY (Patient taking differently: Place 2 sprays into both nostrils daily as needed for allergies or rhinitis.) 16 g 1  ? Multiple Vitamins-Minerals (THERA-M) TABS Take 1 tablet by mouth daily.     ? omeprazole (PRILOSEC) 40 MG capsule TAKE 1 CAPSULE BY MOUTH EVERY DAY 90 capsule 3  ? sertraline (ZOLOFT) 25  MG tablet Take by mouth.    ? valACYclovir (VALTREX) 500 MG tablet Take 1 tablet by mouth as needed.     ? busPIRone (BUSPAR) 10 MG tablet Take 10-20 mg by mouth 2 (two) times daily as needed.    ? propranolol ER (INDERAL LA) 60 MG 24 hr capsule TAKE 1 CAPSULE BY MOUTH EVERY DAY 90 capsule 1  ? ?No current facility-administered medications for this visit.  ? ? ?Review of Systems: ?No chest pain. No shortness of breath. No urinary complaints.  ? ? ?Physical Exam ? ?Wt Readings from Last 3 Encounters:  ?04/11/22 181 lb (82.1 kg)  ?02/07/22 175 lb (79.4 kg)  ?03/15/21 167 lb (75.8 kg)  ? ? ?Ht $Rem'5\' 5"'IaTv$  (1.651 m)   Wt 181 lb (82.1 kg)   BMI 30.12 kg/m?  ?Constitutional:  Generally well appearing female in no acute distress. ?Psychiatric: Pleasant. Normal mood and affect. Behavior is normal. ?EENT: Pupils normal.  Conjunctivae are normal. No scleral icterus. ?Neck supple.  ?Cardiovascular: Normal rate, regular rhythm. No edema ?Pulmonary/chest: Effort normal and breath sounds normal. No wheezing, rales or rhonchi. ?Abdominal: Soft, nondistended, nontender. Bowel sounds active throughout. There are no masses palpable. No hepatomegaly. ?Neurological: Alert and oriented to person place and time. ?Skin: Skin is warm and dry. No rashes noted. ? ?Tye Savoy, NP  04/11/2022, 2:06 PM ? ? ? ? ? ? ? ? ?

## 2022-04-13 ENCOUNTER — Other Ambulatory Visit: Payer: Self-pay | Admitting: Student

## 2022-04-14 DIAGNOSIS — M5412 Radiculopathy, cervical region: Secondary | ICD-10-CM | POA: Diagnosis not present

## 2022-04-14 DIAGNOSIS — R519 Headache, unspecified: Secondary | ICD-10-CM | POA: Diagnosis not present

## 2022-04-14 DIAGNOSIS — M5481 Occipital neuralgia: Secondary | ICD-10-CM | POA: Diagnosis not present

## 2022-04-14 DIAGNOSIS — Z8739 Personal history of other diseases of the musculoskeletal system and connective tissue: Secondary | ICD-10-CM | POA: Diagnosis not present

## 2022-04-14 DIAGNOSIS — M542 Cervicalgia: Secondary | ICD-10-CM | POA: Diagnosis not present

## 2022-04-14 DIAGNOSIS — M25511 Pain in right shoulder: Secondary | ICD-10-CM | POA: Diagnosis not present

## 2022-04-16 NOTE — Progress Notes (Unsigned)
Primary Physician:  Lewis Moccasinewey, Elizabeth R, MD   Patient ID: Stacy Wilkins, female    DOB: 03/23/1986, 36 y.o.   MRN: [id number]  Subjective:    No chief complaint on file.   HPI: Stacy Wilkins  is a 36 y.o. female  With history of left leg DVT and bilateral PE in April 2018 after foot surgery, she was on OCP which was discontinued. No recurrence.  Denies history of hypertension, hyperlipidemia, diabetes, family history of premature CAD.  She was last seen in our office 02/07/2022 at which time she was advised to follow-up in 6 weeks, however she was lost to follow-up until now.  Last office visit ordered Zio monitor, which has unfortunately not been done. ***  ***  Patient was last seen in our office 03/15/2021 at which time she was advised to follow-up in 8 weeks, she has unfortunately been lost to follow-up until now.  At last office visit ordered coronary calcium score and Zio patch monitor, as well as started patient on propranolol 60 mg p.o. daily.  Coronary calcium score was 0, cardiac monitor has not been done, and echocardiogram revealed normal LVEF without significant abnormalities.   Patient presents now for follow-up with concerns of 2 weeks of increased palpitations.  Over the last 1 week this has improved, however palpitations are lasting longer and occurring more frequently for about 2 weeks despite taking propranolol as prescribed.  Patient also admits to relatively high levels of anxiety associated with her health.  Past Medical History:  Diagnosis Date   Anxiety    Depression    DVT (deep venous thrombosis) ([facility])    Generalized headaches    GERD (gastroesophageal reflux disease)    Hyperlipidemia    Migraine    PE (pulmonary thromboembolism) ([facility])    Ulcer    Family History  Problem Relation Age of Onset   Hypertension Mother    Hypertension Father    Prostate cancer Father    Prostate cancer Brother    Diabetes Maternal Uncle    Colon cancer Neg Hx     Liver cancer Neg Hx    Past Surgical History:  Procedure Laterality Date   bunionectomy      EYE SURGERY     TONSILLECTOMY AND ADENOIDECTOMY     Social History   Tobacco Use   Smoking status: Former    Packs/day: 0.25    Years: 3.00    Pack years: 0.75    Types: Cigarettes    Quit date: 2014    Years since quitting: 9.3   Smokeless tobacco: Never   Tobacco comments:    used to smoke socially  Substance Use Topics   Alcohol use: No    Alcohol/week: 0.0 standard drinks   Marital Status: Single  Review of Systems  Constitutional: Negative for malaise/fatigue and weight gain.  Cardiovascular:  Positive for palpitations. Negative for chest pain, claudication, dyspnea on exertion, leg swelling, near-syncope, orthopnea, paroxysmal nocturnal dyspnea and syncope.  Hematologic/Lymphatic: Does not bruise/bleed easily.  Gastrointestinal:  Negative for melena.  Neurological:  Negative for dizziness and weakness.  Objective:  There were no vitals taken for this visit. There is no height or weight on file to calculate BMI.    Physical Exam Vitals reviewed.  Constitutional:      Appearance: She is well-developed.  Cardiovascular:     Rate and Rhythm: Normal rate and regular rhythm.     Pulses: Intact distal pulses.  Carotid pulses are 2+ on the right side and 2+ on the left side.      Radial pulses are 2+ on the right side and 2+ on the left side.       Femoral pulses are 2+ on the right side and 2+ on the left side.      Popliteal pulses are 2+ on the right side and 2+ on the left side.       Dorsalis pedis pulses are 2+ on the right side and 2+ on the left side.       Posterior tibial pulses are 2+ on the right side and 2+ on the left side.     Heart sounds: Normal heart sounds, S1 normal and S2 normal. No murmur heard.   No gallop.  Pulmonary:     Effort: Pulmonary effort is normal. No accessory muscle usage or respiratory distress.     Breath sounds: Normal breath  sounds. No wheezing, rhonchi or rales.  Musculoskeletal:        General: Normal range of motion.     Cervical back: Normal range of motion.     Right lower leg: No edema.     Left lower leg: No edema.   Laboratory examination:      Latest Ref Rng & Units 03/07/2021    8:58 AM 12/21/2018    3:44 AM 02/01/2018   10:55 AM  CMP  Glucose 70 - 99 mg/dL 94   664   88    BUN 6 - 20 mg/dL 12   16   11     Creatinine 0.44 - 1.00 mg/dL   4.03   4.74    Sodium 135 - 145 mmol/L 137   138   139    Potassium 3.5 - 5.1 mmol/L 3.9   3.5   3.6    Chloride 98 - 111 mmol/L 106   109   105    CO2 22 - 32 mmol/L 22   21   23     Calcium 8.9 - 10.3 mg/dL 9.3   8.8   9.5        Latest Ref Rng & Units 03/07/2021    8:58 AM 12/21/2018    3:44 AM 02/01/2018   10:55 AM  CBC  WBC 4.0 - 10.5 K/uL 6.0   7.5   6.1    Hemoglobin 12.0 - 15.0 g/dL 12/23/2018   04/03/2018   56.3    Hematocrit 36.0 - 46.0 % 43.9   40.5   44.6    Platelets 150 - 400 K/uL 198   182   190     Lipid Panel     Component Value Date/Time   CHOL 155 09/27/2017 0919   TRIG 47 09/27/2017 0919   HDL 40 09/27/2017 0919   CHOLHDL 3.9 09/27/2017 0919   CHOLHDL 4 09/29/2015 0936   VLDL 15.2 09/29/2015 0936   LDLCALC 106 (H) 09/27/2017 0919   HEMOGLOBIN A1C Lab Results  Component Value Date   HGBA1C 4.9 02/06/2016   TSH No results for input(s): TSH in the last 8760 hours.  Allergies   Allergies  Allergen Reactions   Macrobid [Nitrofurantoin] Palpitations    Chest pain   Esomeprazole Magnesium Nausea And Vomiting   Bacitracin     Other reaction(s): Unknown   Neo-Bacit-Poly-Lidocaine     Other reaction(s): Unknown   Nickel Other (See Comments)   Onion Nausea And Vomiting   Other Nausea And Vomiting  Green peppers Other reaction(s): Unknown    Medications Prior to Visit:   Outpatient Medications Prior to Visit  Medication Sig Dispense Refill   busPIRone (BUSPAR) 10 MG tablet Take 10-20 mg by mouth 2 (two) times daily as needed.      doxycycline (VIBRAMYCIN) 100 MG capsule Take 100 mg by mouth daily.     etonogestrel (NEXPLANON) 68 MG IMPL implant See admin instructions.     fluticasone (FLONASE) 50 MCG/ACT nasal spray SPRAY 2 SPRAYS INTO EACH NOSTRIL EVERY DAY (Patient taking differently: Place 2 sprays into both nostrils daily as needed for allergies or rhinitis.) 16 g 1   Multiple Vitamins-Minerals (THERA-M) TABS Take 1 tablet by mouth daily.      omeprazole (PRILOSEC) 40 MG capsule TAKE 1 CAPSULE BY MOUTH EVERY DAY 90 capsule 3   propranolol ER (INDERAL LA) 60 MG 24 hr capsule TAKE 1 CAPSULE BY MOUTH EVERY DAY 90 capsule 1   sertraline (ZOLOFT) 25 MG tablet Take by mouth.     valACYclovir (VALTREX) 500 MG tablet Take 1 tablet by mouth as needed.      No facility-administered medications prior to visit.   Final Medications at End of Visit    No outpatient medications have been marked as taking for the 04/18/22 encounter (Appointment) with Carlynn Purl, Mylinh Cragg C, PA-C.   Cardiac Studies:  Coronary calcium score May 14, 2021: Coronary calcium score of 0.  Echocardiogram 04/12/2021:  Normal LV systolic function with visual EF 60-65%. Left ventricle cavity  is normal in size. Normal global wall motion. Normal diastolic filling  pattern, normal LAP.  No significant valvular abnormalities.  Compared to study dated 10/06/2018: no significant change.    Event Monitor for 7 days Start date 09/16/2019: [name] monitoring period was 09/16/2019 - 09/22/2019. Baseline sample showed Sinus Rhythm with a heart rate of 78.6 bpm. There were 0 critical, 0 serious, and 3 stable events that occurred. The report analysis of the critical, serious, stable and manually triggered events reveal NSR. There were rare PACs and PVCs.   EKG  02/07/2022: Sinus rhythm at a rate of 70 bpm.  Normal axis.  No evidence of ischemia or underlying injury pattern.  Compared to EKG 03/15/2021, no significant change.  EKG 09/16/2019: Normal sinus rhythm  at rate of 85 bpm, normal axis.  No evidence of ischemia, normal EKG. No significant change from  EKG 06/09/2018   Assessment:   No diagnosis found.  No orders of the defined types were placed in this encounter.  There are no discontinued medications.   Recommendations:  LINSAY VOGT  is a 36 y.o.female  With history of left leg DVT and bilateral PE in April 2018 after foot surgery, she was on OCP which was discontinued. No recurrence.  She was last seen in our office 02/07/2022 at which time she was advised to follow-up in 6 weeks, however she was lost to follow-up until now.  Last office visit ordered Zio monitor, which has unfortunately not been done. ***  ***  Patient was last seen in our office 03/15/2021 at which time she was advised to follow-up in 8 weeks, she has unfortunately been lost to follow-up until now.  At last office visit ordered coronary calcium score and Zio patch monitor, as well as started patient on propranolol 60 mg p.o. daily.  Coronary calcium score was 0, cardiac monitor has not been done, and echocardiogram revealed normal LVEF without significant abnormalities.  Reviewed and discussed results of coronary calcium score and  echocardiogram.  Advised patient to continue propranolol symptoms have improved over the last 1 week.  Blood pressure is well controlled.  We will obtain 2-week cardiac monitor to further evaluate for underlying arrhythmias.  Further recommendations pending results of cardiac monitor.  Suspect symptoms are related to underlying PACs and PVCs which were documented on patient's previous cardiac monitor in 2020.  Follow-up in 6 weeks, sooner if needed.   Rayford Halsted, PA-C 04/16/2022, 3:07 PM Office: [phone]

## 2022-04-16 NOTE — Telephone Encounter (Signed)
From patient.

## 2022-04-18 ENCOUNTER — Ambulatory Visit: Payer: BC Managed Care – PPO | Admitting: Student

## 2022-04-18 DIAGNOSIS — D225 Melanocytic nevi of trunk: Secondary | ICD-10-CM | POA: Diagnosis not present

## 2022-04-18 DIAGNOSIS — R102 Pelvic and perineal pain: Secondary | ICD-10-CM | POA: Diagnosis not present

## 2022-04-18 DIAGNOSIS — L718 Other rosacea: Secondary | ICD-10-CM | POA: Diagnosis not present

## 2022-04-18 DIAGNOSIS — L858 Other specified epidermal thickening: Secondary | ICD-10-CM | POA: Diagnosis not present

## 2022-04-18 DIAGNOSIS — L814 Other melanin hyperpigmentation: Secondary | ICD-10-CM | POA: Diagnosis not present

## 2022-04-19 NOTE — Progress Notes (Signed)
Reviewed and agree with documentation and assessment and plan. K. Veena Marjorie Deprey , MD   

## 2022-04-24 ENCOUNTER — Emergency Department (HOSPITAL_BASED_OUTPATIENT_CLINIC_OR_DEPARTMENT_OTHER): Payer: BC Managed Care – PPO

## 2022-04-24 ENCOUNTER — Other Ambulatory Visit: Payer: Self-pay

## 2022-04-24 ENCOUNTER — Encounter (HOSPITAL_BASED_OUTPATIENT_CLINIC_OR_DEPARTMENT_OTHER): Payer: Self-pay | Admitting: Obstetrics and Gynecology

## 2022-04-24 ENCOUNTER — Emergency Department (HOSPITAL_BASED_OUTPATIENT_CLINIC_OR_DEPARTMENT_OTHER)
Admission: EM | Admit: 2022-04-24 | Discharge: 2022-04-24 | Disposition: A | Discharge status: Home / Self Care | Payer: BC Managed Care – PPO | Attending: Emergency Medicine | Admitting: Emergency Medicine

## 2022-04-24 ENCOUNTER — Ambulatory Visit: Payer: Self-pay

## 2022-04-24 DIAGNOSIS — R202 Paresthesia of skin: Secondary | ICD-10-CM | POA: Insufficient documentation

## 2022-04-24 DIAGNOSIS — G8929 Other chronic pain: Secondary | ICD-10-CM | POA: Insufficient documentation

## 2022-04-24 DIAGNOSIS — R109 Unspecified abdominal pain: Secondary | ICD-10-CM | POA: Diagnosis not present

## 2022-04-24 LAB — COMPREHENSIVE METABOLIC PANEL
ALT: 19 U/L (ref 0–44)
AST: 18 U/L (ref 15–41)
Albumin: 3.8 g/dL (ref 3.5–5.0)
Alkaline Phosphatase: 40 U/L (ref 38–126)
Anion gap: 11 (ref 5–15)
BUN: 10 mg/dL (ref 6–20)
CO2: 21 mmol/L — ABNORMAL LOW (ref 22–32)
Calcium: 8.6 mg/dL — ABNORMAL LOW (ref 8.9–10.3)
Chloride: 105 mmol/L (ref 98–111)
Creatinine, Ser: 0.84 mg/dL (ref 0.44–1.00)
GFR, Estimated: >60 mL/min (ref >60)
Glucose, Bld: 95 mg/dL (ref 70–99)
Potassium: 3.8 mmol/L (ref 3.5–5.1)
Sodium: 137 mmol/L (ref 135–145)
Total Bilirubin: 0.4 mg/dL (ref 0.3–1.2)
Total Protein: 6 g/dL — ABNORMAL LOW (ref 6.5–8.1)

## 2022-04-24 LAB — CBC
HCT: 37.6 % (ref 36.0–46.0)
Hemoglobin: 12.7 g/dL (ref 12.0–15.0)
MCH: 29.3 pg (ref 26.0–34.0)
MCHC: 33.8 g/dL (ref 30.0–36.0)
MCV: 86.6 fL (ref 80.0–100.0)
Platelets: 191 10*3/uL (ref 150–400)
RBC: 4.34 MIL/uL (ref 3.87–5.11)
RDW: 12.3 % (ref 11.5–15.5)
WBC: 5.8 10*3/uL (ref 4.0–10.5)
nRBC: 0 % (ref 0.0–0.2)

## 2022-04-24 LAB — LIPASE, BLOOD: Lipase: 24 U/L (ref 11–51)

## 2022-04-24 LAB — URINALYSIS, ROUTINE W REFLEX MICROSCOPIC
Bilirubin Urine: NEGATIVE
Glucose, UA: NEGATIVE mg/dL
Hgb urine dipstick: NEGATIVE
Ketones, ur: NEGATIVE mg/dL
Leukocytes,Ua: NEGATIVE
Nitrite: NEGATIVE
Protein, ur: 30 mg/dL — AB
Specific Gravity, Urine: 1.028 (ref 1.005–1.030)
pH: 6 (ref 5.0–8.0)

## 2022-04-24 LAB — PREGNANCY, URINE: Preg Test, Ur: NEGATIVE

## 2022-04-24 MED ORDER — IOHEXOL 300 MG/ML  SOLN
100.0000 mL | Freq: Once | INTRAMUSCULAR | Status: AC | PRN
  Administered 2022-04-24: 85 mL via INTRAVENOUS

## 2022-04-24 MED ORDER — METHYLPREDNISOLONE 4 MG PO TBPK: ORAL_TABLET | ORAL | 0 refills | Status: DC

## 2022-04-24 NOTE — Discharge Instructions (Addendum)
You were seen in the emergency department for flank and abdominal pain.  As we discussed your lab work and your imaging looked reassuring today.  There is no evidence of blockages or infections.  I think you would benefit from further laboratory work-up to include cortisol levels and thyroid panel.  Your electrolytes were normal, but there are other metabolic causes for your hands and feet burning which require some further investigation.  Continue to monitor how you're doing and return to the ER for new or worsening symptoms.

## 2022-04-24 NOTE — Telephone Encounter (Signed)
  Chief Complaint: results Symptoms: r sided kidney pain Frequency:  Pertinent Negatives: NA Disposition: [] ED /[] Urgent Care (no appt availability in office) / [] Appointment(In office/virtual)/ []  [facility]/ [] Home Care/ [] Refused Recommended Disposition /[] [facility]/ [x]  Follow-up with PCP Additional Notes: pt seen results on Mychart and had questions regarding the results. I advised pt to speak with PCP about results. She states she has appt tomorrow at 1300 with PCP.    Summary: right side discomfort / imaging follow up   The patient has experienced right side discomfort   The patient was recently seen for a CT  of their kidneys and saw results that indicated there were concerns related with their right kidney specifically   The patient would like to discuss their results further when possible   The patient called on the community line

## 2022-04-24 NOTE — ED Provider Notes (Addendum)
[facility] EMERGENCY DEPT Provider Note   CSN: [id number] Arrival date & time: 04/24/22  1700     History  Chief Complaint  Patient presents with   Flank Pain   Abdominal Pain    Stacy Wilkins is a 36 y.o. female who presents to the emergency department complaining of right sided flank pain and abdominal pain.  Patient states her symptoms been going on for approximately a year.  She states they have gotten significantly worse in the past several days, which prompted her to be seen in the ER.  States that several months ago she saw her PCP, who placed her on prednisone thinking this was musculoskeletal related.  She states at that time had her pain completely went away, but returned as soon as the prednisone was finished.  She has a history of ovarian cyst, and went to her OB/GYN last week.  She had a vaginal ultrasound which showed no evidence of cysts.  She also complaining of a burning sensation in her bilateral hands and feet, unsure if they are related.  No fever, nausea, vomiting, diarrhea, constipation, urinary symptoms.   Flank Pain Associated symptoms include abdominal pain. Pertinent negatives include no chest pain.  Abdominal Pain Associated symptoms: no chest pain, no chills, no constipation, no diarrhea, no dysuria, no fever, no hematuria, no nausea and no vomiting       Home Medications Prior to Admission medications   Medication Sig Start Date End Date Taking? Authorizing Provider  methylPREDNISolone (MEDROL DOSEPAK) 4 MG TBPK tablet Take per package instructions 04/24/22  Yes Makinna Andy T, PA-C  busPIRone (BUSPAR) 10 MG tablet Take 10-20 mg by mouth 2 (two) times daily as needed. 01/22/22   [provider]  doxycycline (VIBRAMYCIN) 100 MG capsule Take 100 mg by mouth daily. 08/31/20   [provider]  etonogestrel (NEXPLANON) 68 MG IMPL implant See admin instructions.    [provider]  fluticasone (FLONASE) 50 MCG/ACT  nasal spray SPRAY 2 SPRAYS INTO EACH NOSTRIL EVERY DAY Patient taking differently: Place 2 sprays into both nostrils daily as needed for allergies or rhinitis. 04/18/18   Benjiman Core D, PA-C  Multiple Vitamins-Minerals (THERA-M) TABS Take 1 tablet by mouth daily.     [provider]  omeprazole (PRILOSEC) 40 MG capsule TAKE 1 CAPSULE BY MOUTH EVERY DAY 01/12/21   Napoleon Form, MD  propranolol ER (INDERAL LA) 60 MG 24 hr capsule TAKE 1 CAPSULE BY MOUTH EVERY DAY 04/16/22   Cantwell, Celeste C, PA-C  sertraline (ZOLOFT) 25 MG tablet Take by mouth. 01/22/22   [provider]  valACYclovir (VALTREX) 500 MG tablet Take 1 tablet by mouth as needed.  08/27/19   [provider]      Allergies    Macrobid [nitrofurantoin], Esomeprazole magnesium, Bacitracin, Neo-bacit-poly-lidocaine, Nickel, Onion, and Other    Review of Systems   Review of Systems  Constitutional:  Negative for chills and fever.  Cardiovascular:  Negative for chest pain.  Gastrointestinal:  Positive for abdominal pain. Negative for blood in stool, constipation, diarrhea, nausea and vomiting.  Genitourinary:  Positive for flank pain. Negative for difficulty urinating, dysuria, frequency, hematuria, pelvic pain and urgency.  Musculoskeletal:  Negative for back pain.  All other systems reviewed and are negative.  Physical Exam Updated Vital Signs BP 107/75   Pulse 76   Temp 99.6 F (37.6 C)   Resp 16   Ht 5\' 5"  (1.651 m)   Wt 82 kg  SpO2 100%   BMI 30.08 kg/m  Physical Exam Vitals and nursing note reviewed.  Constitutional:      Appearance: Normal appearance.  HENT:     Head: Normocephalic and atraumatic.  Eyes:     Conjunctiva/sclera: Conjunctivae normal.  Cardiovascular:     Rate and Rhythm: Normal rate and regular rhythm.  Pulmonary:     Effort: Pulmonary effort is normal. No respiratory distress.     Breath sounds: Normal breath sounds.  Abdominal:     General: There is no  distension.     Palpations: Abdomen is soft.     Tenderness: There is no abdominal tenderness. There is no right CVA tenderness, left CVA tenderness, guarding or rebound.  Skin:    General: Skin is warm and dry.     Comments: No color changes or lesions noted to bilateral hands or feet  Neurological:     General: No focal deficit present.     Mental Status: She is alert.    ED Results / Procedures / Treatments   Labs (all labs ordered are listed, but only abnormal results are displayed) Labs Reviewed  URINALYSIS, ROUTINE W REFLEX MICROSCOPIC - Abnormal; Notable for the following components:      Result Value   APPearance HAZY (*)    Protein, ur 30 (*)    Bacteria, UA MANY (*)    All other components within normal limits  COMPREHENSIVE METABOLIC PANEL - Abnormal; Notable for the following components:   CO2 21 (*)    Calcium 8.6 (*)    Total Protein 6.0 (*)    All other components within normal limits  PREGNANCY, URINE  CBC  LIPASE, BLOOD    EKG None  Radiology CT ABDOMEN PELVIS W CONTRAST  Result Date: 04/24/2022 CLINICAL DATA:  RLQ abdominal pain (Age >= 14y) EXAM: CT ABDOMEN AND PELVIS WITH CONTRAST TECHNIQUE: Multidetector CT imaging of the abdomen and pelvis was performed using the standard protocol following bolus administration of intravenous contrast. RADIATION DOSE REDUCTION: This exam was performed according to the departmental dose-optimization program which includes automated exposure control, adjustment of the mA and/or kV according to patient size and/or use of iterative reconstruction technique. CONTRAST:  85mL OMNIPAQUE IOHEXOL 300 MG/ML  SOLN COMPARISON:  June 2020 FINDINGS: Lower chest: No acute abnormality. Hepatobiliary: No focal liver abnormality is seen. No gallstones, gallbladder wall thickening, or biliary dilatation. Pancreas: Unremarkable. No pancreatic ductal dilatation or surrounding inflammatory changes. Spleen: Normal in size without focal abnormality.  Adrenals/Urinary Tract: Adrenals are unremarkable. Too small to characterize low-density lesion of the right kidney. Bladder is unremarkable. Stomach/Bowel: Stomach is within normal limits. Bowel is normal in caliber. Normal appendix. Vascular/Lymphatic: No significant vascular abnormality. No enlarged nodes. Reproductive: Uri's is unremarkable. Right adnexal fluid density lesion measuring 3.8 cm. No follow-up imaging recommended. Other: No free fluid.  Abdominal wall is unremarkable. Musculoskeletal: No acute osseous abnormality. IMPRESSION: No acute abnormality.  Normal appendix. Electronically Signed   By: Guadlupe SpanishPraneil  Patel M.D.   On: 04/24/2022 11:01    Procedures Procedures    Medications Ordered in ED Medications  iohexol (OMNIPAQUE) 300 MG/ML solution 100 mL (85 mLs Intravenous Contrast Given 04/24/22 1046)    ED Course/ Medical Decision Making/ A&P                           Medical Decision Making Amount and/or Complexity of Data Reviewed Labs: ordered. Radiology: ordered.  Risk Prescription drug management.  This patient is a 36 y.o. female who presents to the ED for concern of flank pain, this involves an extensive number of treatment options, and is a complaint that carries with it a high risk of complications and morbidity. The emergent differential diagnosis prior to evaluation includes, but is not limited to,  AAA, Renal artery/vein embolism/thrombosis, Mesenteric ischemia, Pyelonephritis, Nephrolithiasis, Cystitis, Biliary colic, Pancreatitis, Perforated peptic ulcer, Appendicitis, Diverticulitis, Bowel obstruction, Shingles, Epidural abscess, Ectopic Pregnancy, PID/TOA, Ovarian cyst, Ovarian torsion. This is not an exhaustive differential.   Past Medical History / Co-morbidities / Social History: Depression, anxiety, HLD, GERD, migraine, DVT, PE  Physical Exam: Physical exam performed. The pertinent findings include: Abdomen is soft, nontender nondistended.  No CVA  tenderness.  No lesions or skin color changes to the bilateral hands or feet.  Normal vital signs.  Lab Tests: I ordered, and personally interpreted labs.  The pertinent results include: No leukocytosis, normal hemoglobin.  Electrolytes within normal limits.  Normal lipase.  Negative pregnancy and urinalysis negative for infection.   Imaging Studies: I ordered imaging studies including CT abdomen pelvis. I independently visualized and interpreted imaging which showed no acute intra-abdominal pathology. I agree with the radiologist interpretation.   Disposition: After consideration of the diagnostic results and the patients response to treatment, I feel that patient's not requiring admission or inpatient treatment for her symptoms.  Her exam and laboratory work-up including imaging is relatively benign, and reassuring today.  No acute etiology found as the cause of her symptoms. Discussed with the patient that with the symptoms of paresthesias that she is having, and think she needs further laboratory evaluation including endocrine and metabolic studies.  She has a follow-up appointment with her PCP tomorrow, and I encouraged her to discuss this.  She endorsed success with course of steroids in the past, so will prescribe this again in the interim. We discussed reasons to return to the emergency department, and patient's agreeable to the plan.  Final Clinical Impression(s) / ED Diagnoses Final diagnoses:  Chronic right flank pain  Paresthesias    Rx / DC Orders ED Discharge Orders          Ordered    methylPREDNISolone (MEDROL DOSEPAK) 4 MG TBPK tablet        04/24/22 1122           Portions of this report may have been transcribed using voice recognition software. Every effort was made to ensure accuracy; however, inadvertent computerized transcription errors may be present.   Jeanella Flattery 04/24/22 1126    Jacalyn Lefevre, MD 04/24/22 1505

## 2022-04-24 NOTE — ED Triage Notes (Signed)
Patient reports to the ER for right flank pain that runs into her abdomen. Patient reports she is also having burning hands and feet as well. Patient reports this problem with her flank has been ongoing but has gotten progressively worse. Patient reports she was put on prednisone which helped briefly and she went to the [facility] who checked for Cysts as she has a hx of those, but she was clear. Patient reports she came for possible imaging as she is having increased pain.

## 2022-04-24 NOTE — ED Notes (Signed)
Patient transported to CT 

## 2022-04-25 ENCOUNTER — Inpatient Hospital Stay: Payer: BC Managed Care – PPO

## 2022-04-25 DIAGNOSIS — M545 Low back pain, unspecified: Secondary | ICD-10-CM | POA: Diagnosis not present

## 2022-04-25 DIAGNOSIS — M5416 Radiculopathy, lumbar region: Secondary | ICD-10-CM | POA: Diagnosis not present

## 2022-04-25 DIAGNOSIS — K219 Gastro-esophageal reflux disease without esophagitis: Secondary | ICD-10-CM | POA: Diagnosis not present

## 2022-04-25 DIAGNOSIS — Z23 Encounter for immunization: Secondary | ICD-10-CM | POA: Diagnosis not present

## 2022-05-01 ENCOUNTER — Other Ambulatory Visit: Payer: Self-pay | Admitting: Family Medicine

## 2022-05-01 DIAGNOSIS — M5441 Lumbago with sciatica, right side: Secondary | ICD-10-CM

## 2022-05-03 DIAGNOSIS — M545 Low back pain, unspecified: Secondary | ICD-10-CM | POA: Diagnosis not present

## 2022-05-03 DIAGNOSIS — N390 Urinary tract infection, site not specified: Secondary | ICD-10-CM | POA: Diagnosis not present

## 2022-05-09 ENCOUNTER — Ambulatory Visit: Payer: BC Managed Care – PPO | Admitting: Gastroenterology

## 2022-05-09 ENCOUNTER — Encounter: Payer: Self-pay | Admitting: Gastroenterology

## 2022-05-09 VITALS — BP 114/80 | HR 94 | Ht 65.0 in | Wt 181.0 lb

## 2022-05-09 DIAGNOSIS — K625 Hemorrhage of anus and rectum: Secondary | ICD-10-CM

## 2022-05-09 DIAGNOSIS — K602 Anal fissure, unspecified: Secondary | ICD-10-CM

## 2022-05-09 MED ORDER — AMBULATORY NON FORMULARY MEDICATION: 1 refills | Status: AC

## 2022-05-09 NOTE — Patient Instructions (Addendum)
If you are age 36 or older, your body mass index should be between 23-30. Your Body mass index is 30.12 kg/m. If this is out of the aforementioned range listed, please consider follow up with your Primary Care Provider.  If you are age 57 or younger, your body mass index should be between 19-25. Your Body mass index is 30.12 kg/m. If this is out of the aformentioned range listed, please consider follow up with your Primary Care Provider.   ________________________________________________________  The [facility] providers would like to encourage you to use [facility] to communicate with providers for non-urgent requests or questions.  Due to long hold times on the telephone, sending your provider a message by [facility] may be a faster and more efficient way to get a response.  Please allow 48 business hours for a response.  Please remember that this is for non-urgent requests.  _______________________________________________________  Patient Drug Education for Nitroglycerin Ointment  Nitroglycerin ointment (NTG) is used to help heal anal fissures. The ointment relaxes the smooth muscle around the anus and promotes blood flow which helps heal the fissure (tear). The NTG reduces anal canal pressure, which diminishes pain and spasm. We use a diluted concentration of NTG (.125%) compared to the 2% that is typically used for heart patients, and this is why you need to obtain the medication from a pharmacy which will compound your prescription.  The NTG ointment should be applied 3 times per day, or as directed.  A pea-sized drop should be placed on the tip of your finger and then gently placed inside the anus. The finger should be inserted 1/3 - 1/2 its length and may be covered with a plastic glove or finger cot. You may use Vaseline to help coat the finger or dilute the ointment. If you are advised to mix the NTG with steroid ointment, limit the steroids to one to two weeks.  The first few applications should  be taken lying down, as mild light-headedness or a brief headache may occur.  It may take several weeks for the fissure to begin healing, and you will need to continue taking the medication after resolution of your symptoms.  It is important to continue the treatment for the entire time period - up to 3 months or as directed. It takes up to two years for the healing tissue to regain the normal skin strength. You will be advised to add fiber to your diet, increase water intake to 7-8 glasses per day, take relaxing baths or sitz baths, and avoid prolonged sitting and straining on the commode. Local anesthetic ointment may be added.  Initially, the anal fissure is very inflamed, which allows more of the NTG to get into the blood. This allows for a higher incidence of the most common side effect - a headache. It is usually brief and mild, but may require Tylenol or Advil. You may dilute the NTG further with Vaseline to decrease the headaches. As the treatment progresses and the fissure begins to heal, the headaches will tend to dissipate. Other side effects include lightheadedness, flushing, dizziness, nervousness, nausea, and vomiting. If any of these side effects persist or worsen, notify us promptly. Stop using the NTG and notify us immediately if you develop the rare side effects of severe dizziness, fainting, fast/pounding heartbeat, paleness, sweating, blurred vision, dry mouth, dark urine, bluish lips/skin/nails, unusual tiredness, severe weakness, irregular heartbeat, seizures, or chest pain. Serious allergic reactions are unusual, but seek immediate medical attention if you develop a  rash, swelling, dizziness, or trouble breathing.  Tell us if you are allergic to nitrates, have severe anemia, low blood pressure, dehydration, chronic heart failure, cardiomyopathy, recent heart attack, increased pressure in the brain, or exposure to nitrates while on the job. Do not use NTG while driving or working  around machinery if you are drowsy, dizzy, have lightheadedness, or blurred vision. Limit alcoholic beverages. To minimize dizziness and lightheadedness, get up slowly when rising from a sitting or lying position. The elderly may be more prone to dizziness and falling. While there are not adequate studies to confirm the safety of NTG in pregnant or breast feeding women, it has been used without incident so far. We recommend waiting at least one hour after applying the NTG ointment before breast feeding.  Do not use NTG ointment if you are taking drugs for sexual problems [e.g., sildenafil (Viagra), tadalafil (Cialis), vardenafil (Levitra)]. Use caution before taking cough-and-cold products, diet aids, or NSAIDs preparations because they may contain ingredients that could increase your blood pressure, cause a fast heartbeat, or increase chest pain (e.g., pseudoephedrine, phenylephrine, chlorpheniramine, diphenhydramine, clemastine, ibuprofen, and naproxen). Tell us if you drink alcohol, take alteplase, migraine drugs (ergotamine), water pills/diuretics such as furosemide or hydrochlorothiazide, or other drugs for high blood pressure (beta blockers, calcium channel blockers, ACE inhibitors).  Store the NTG at room temperature and keep away from light and moisture. Close the container tightly after each use. Do not store in the bathroom. Keep away from children and pets. If you have any questions or problems please call us at  [phone].   Your provider has prescribed Nitro 0.125% gel for you. Please follow the directions written on your prescription bottle or given to you specifically by your provider. Since this is a specialty medication and is not readily available at most local pharmacies, we have sent your prescription to:  [facility] information is below: Address: [address], [place], [place] [address]  [phone]  *Please DO NOT go directly from our office to pick up  this medication! Give the pharmacy 1 day to process the prescription as this is compounded and takes time to make.  Continue Benefiber supplement  It was a pleasure to see you today!  Thank you for trusting me with your gastrointestinal care!

## 2022-05-09 NOTE — Progress Notes (Signed)
PROCEDURE NOTE: The patient presents with symptomatic grade I -II  hemorrhoids, requesting rubber band ligation of his/her hemorrhoidal disease.  All risks, benefits and alternative forms of therapy were described and informed consent was obtained.  In the Left Lateral Decubitus position anoscopic examination revealed anal fissure, grade I hemorrhoids in the right anterior, right posterior and in the left lateral position(s).   The decision was made to not proceed with band ligation of internal hemorrhoid,   The patient was discharged home without pain or other issues.  Dietary and behavioral recommendations were given and along with follow-up instructions.     The following adjunctive treatments were recommended:  Benefiber 1 tablespoon twice daily with meals Nitroglycerin 0.125% 3-4 times daily small pea-sized amount per rectum for 6 to 8 weeks  The patient will return in 8 weeks for  follow-up . No complications were encountered and the patient tolerated the procedure well.

## 2022-05-13 IMAGING — DX DG CHEST 2V
2 series · 2 of 2 positions shown · non-contrast
Comparison: December 21, 2018

CLINICAL DATA: Chest pain and shortness of breath

EXAM:
CHEST - 2 VIEW

[chest pa]
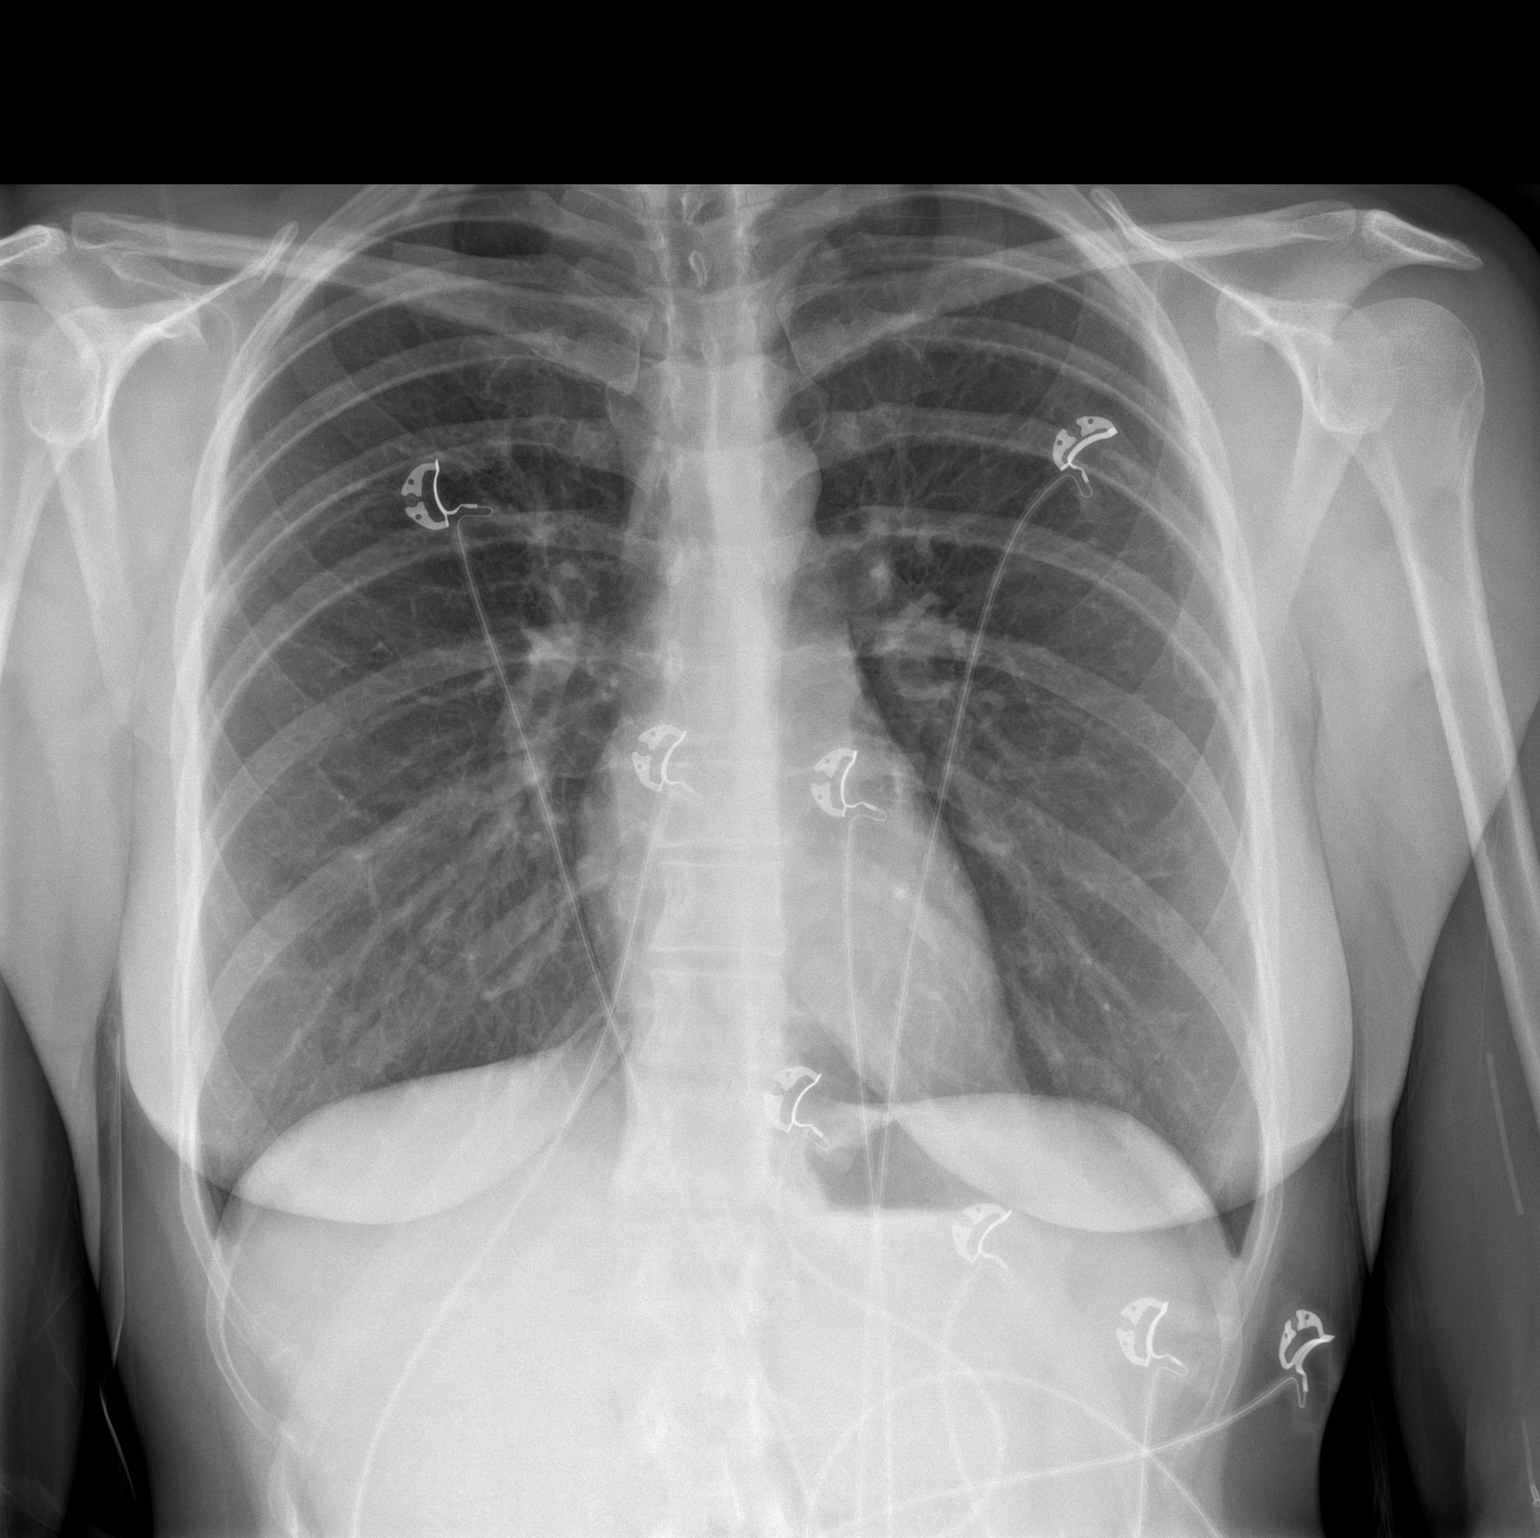

[chest lat]
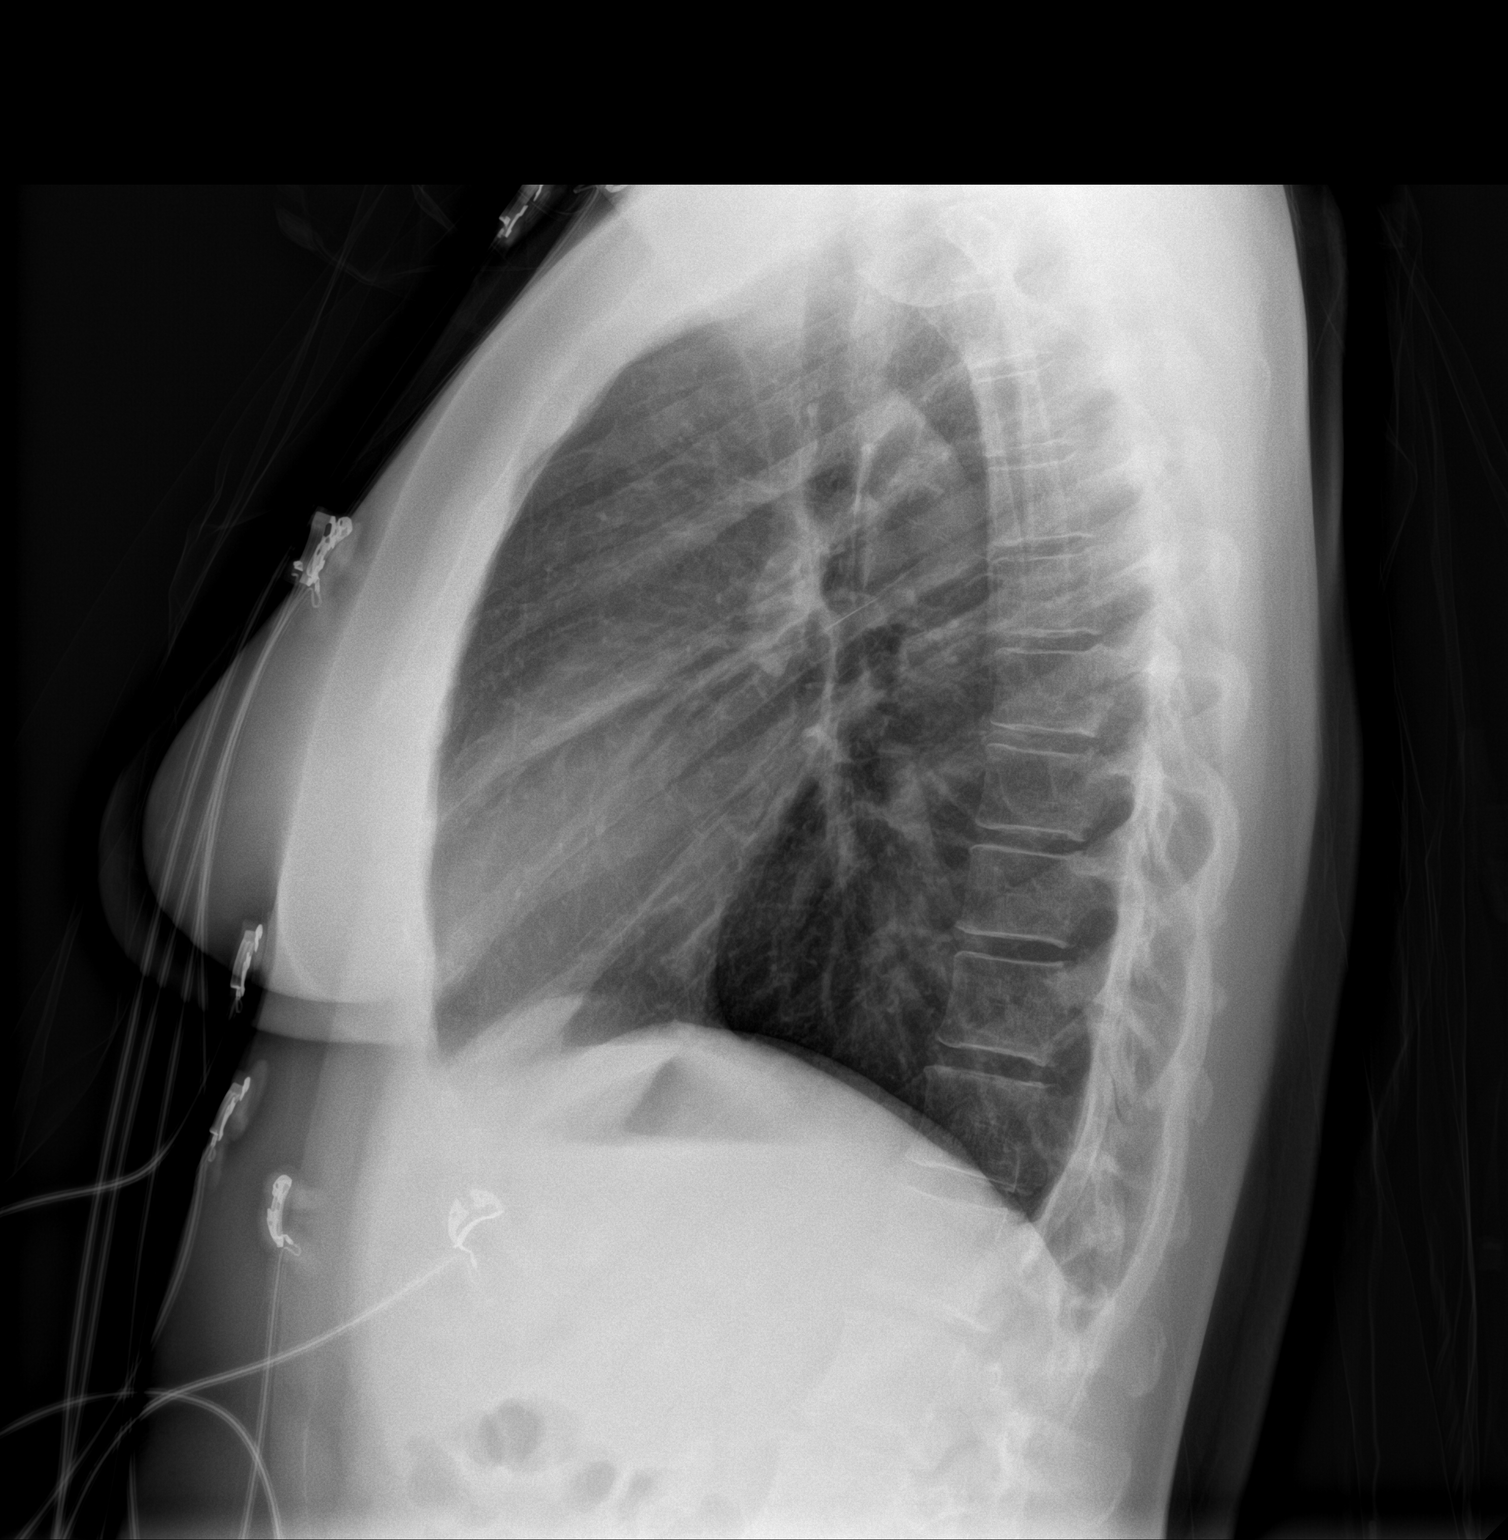

[2 of 2 positions shown; findings below may reference images not displayed]

FINDINGS: Lungs are clear. Heart size and pulmonary vascularity are normal. No
adenopathy. No pneumothorax. No bone lesions.
IMPRESSION: Lungs clear.  Cardiac silhouette normal.

## 2022-05-15 DIAGNOSIS — L858 Other specified epidermal thickening: Secondary | ICD-10-CM | POA: Diagnosis not present

## 2022-05-15 DIAGNOSIS — L719 Rosacea, unspecified: Secondary | ICD-10-CM | POA: Diagnosis not present

## 2022-05-16 ENCOUNTER — Ambulatory Visit
Admission: RE | Admit: 2022-05-16 | Discharge: 2022-05-16 | Disposition: A | Discharge status: Home / Self Care | Payer: BC Managed Care – PPO | Source: Ambulatory Visit | Attending: Family Medicine | Admitting: Family Medicine

## 2022-05-16 DIAGNOSIS — M545 Low back pain, unspecified: Secondary | ICD-10-CM | POA: Diagnosis not present

## 2022-05-16 DIAGNOSIS — M47816 Spondylosis without myelopathy or radiculopathy, lumbar region: Secondary | ICD-10-CM | POA: Diagnosis not present

## 2022-05-16 DIAGNOSIS — M5441 Lumbago with sciatica, right side: Secondary | ICD-10-CM

## 2022-05-17 ENCOUNTER — Encounter: Payer: Self-pay | Admitting: Gastroenterology

## 2022-05-17 DIAGNOSIS — K219 Gastro-esophageal reflux disease without esophagitis: Secondary | ICD-10-CM | POA: Diagnosis not present

## 2022-05-17 DIAGNOSIS — M545 Low back pain, unspecified: Secondary | ICD-10-CM | POA: Diagnosis not present

## 2022-05-17 DIAGNOSIS — Z87898 Personal history of other specified conditions: Secondary | ICD-10-CM | POA: Diagnosis not present

## 2022-05-23 ENCOUNTER — Ambulatory Visit: Payer: BC Managed Care – PPO | Attending: Family Medicine

## 2022-05-23 ENCOUNTER — Ambulatory Visit: Payer: BC Managed Care – PPO | Admitting: Student

## 2022-05-23 ENCOUNTER — Ambulatory Visit: Payer: BC Managed Care – PPO | Admitting: Family Medicine

## 2022-05-23 DIAGNOSIS — R293 Abnormal posture: Secondary | ICD-10-CM | POA: Diagnosis not present

## 2022-05-23 DIAGNOSIS — R252 Cramp and spasm: Secondary | ICD-10-CM | POA: Insufficient documentation

## 2022-05-23 DIAGNOSIS — M6281 Muscle weakness (generalized): Secondary | ICD-10-CM | POA: Insufficient documentation

## 2022-05-23 DIAGNOSIS — R262 Difficulty in walking, not elsewhere classified: Secondary | ICD-10-CM | POA: Diagnosis not present

## 2022-05-23 DIAGNOSIS — M5459 Other low back pain: Secondary | ICD-10-CM | POA: Diagnosis not present

## 2022-05-23 NOTE — Therapy (Signed)
OUTPATIENT PHYSICAL THERAPY THORACOLUMBAR EVALUATION   Patient Name: Stacy Wilkins MRN: [id number] DOB:1985/12/02, 36 y.o., female Today's Date: 05/23/2022   PT End of Session - 05/23/22 0857     Visit Number 1    Authorization Type BCBS    PT Start Time 0848    PT Stop Time 0930    PT Time Calculation (min) 42 min    Activity Tolerance Patient tolerated treatment well    Behavior During Therapy [facility] for tasks assessed/performed             Past Medical History:  Diagnosis Date   Anxiety    Depression    DVT (deep venous thrombosis) (HCC)    Generalized headaches    GERD (gastroesophageal reflux disease)    Hyperlipidemia    Migraine    PE (pulmonary thromboembolism) (HCC)    Ulcer    Past Surgical History:  Procedure Laterality Date   bunionectomy      EYE SURGERY     TONSILLECTOMY AND ADENOIDECTOMY     Patient Active Problem List   Diagnosis Date Noted   GERD (gastroesophageal reflux disease) 09/01/2020   Anxiety 09/16/2017   History of DVT (deep vein thrombosis) 03/21/2017   History of pulmonary embolism 03/21/2017   Food allergy 11/08/2016   Palpitations 02/27/2016   Hemorrhoids 09/29/2015    PCP: Lewis Moccasin, MD  REFERRING PROVIDER: Lewis Moccasin, MD  REFERRING DIAG: Lewis Moccasin, MD  Rationale for Evaluation and Treatment Rehabilitation  THERAPY DIAG:  Other low back pain - Plan: PT plan of care cert/re-cert  Abnormal posture - Plan: PT plan of care cert/re-cert  Cramp and spasm - Plan: PT plan of care cert/re-cert  Difficulty in walking, not elsewhere classified - Plan: PT plan of care cert/re-cert  Muscle weakness (generalized) - Plan: PT plan of care cert/re-cert  ONSET DATE: 02/24/22  SUBJECTIVE:                                                                                                                                                                                           SUBJECTIVE STATEMENT: Patient  states she has been having gradually worsening back pain over the last few months.  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  She is not a very active person.  She admits she does not exercise and typically doesn't go outdoors and do things.  The most active she would be is playing with her 31 and 17 year old nieces.  She states she does hurt a lot when she is with them because she is constantly picking them  up.  She was recently seen in ER for right flank pain but all diagnostic tests ruled out issues.  She has also recently had some other G.I. issues.  See ED notes.   PERTINENT HISTORY:  Depression, anxiety, hx of DVT  PAIN:  Are you having pain? Yes: NPRS scale: 4/10 Pain location: right side low back Pain description: aching Aggravating factors: sitting prolonged/work  Relieving factors: prednisone currently but on last day of this, ice , heat   PRECAUTIONS: None  WEIGHT BEARING RESTRICTIONS No  FALLS:  Has patient fallen in last 6 months? No  LIVING ENVIRONMENT: Lives with: lives alone Lives in: House/apartment  OCCUPATION: desk job, sits most of the day  PLOF: Independent  PATIENT GOALS to eliminate pain and be able to function at prior level of function   OBJECTIVE:   DIAGNOSTIC FINDINGS:  IMPRESSION: Lumbar spondylosis, as outlined and with findings most notably as follows.   At L4-L5, there is mild disc degeneration. Small disc bulge. Facet arthrosis (mild right, mild-to-moderate left) with ligamentum flavum hypertrophy. Minimal relative left subarticular narrowing (without nerve root impingement). Mild relative left neural foraminal narrowing.   No significant spinal canal or foraminal stenosis at the remaining levels.   Mild edema within the posterior elements bilaterally at L4-L5, likely degenerative and related to facet arthrosis.   Thoracolumbar dextrocurvature.   Trace grade 1 retrolisthesis at L3-L4 and L4-L5.  PATIENT SURVEYS:  FOTO 79 (goal  is 18)  SCREENING FOR RED FLAGS: Bowel or bladder incontinence: No Spinal tumors: No Cauda equina syndrome: No Compression fracture: No Abdominal aneurysm: No  COGNITION:  Overall cognitive status: Within functional limits for tasks assessed     SENSATION: WFL  MUSCLE LENGTH: Hamstrings: Right 50 deg; Left 60 deg Thomas test: Right pos ; Left pos   POSTURE:  thoracic scoliosis, worsens with fwd flexion, trunk shift to right in sitting  PALPATION: Tender right S.I.   LUMBAR ROM:   Active  A/PROM  eval  Flexion WNL but with compensation/ thoracic scoliosis noted  Extension WNL with pain  Right lateral flexion To joint line  Left lateral flexion To joint line  Right rotation WNL  Left rotation WNL   (Blank rows = not tested)  LOWER EXTREMITY ROM:     WFL  LOWER EXTREMITY MMT:    Generally 4+ to 5/5 (mild weakness in bilateral hip ER's)  LUMBAR SPECIAL TESTS:  Straight leg raise test: Negative  GAIT: Distance walked: 50 Assistive device utilized: None Level of assistance: Complete Independence Comments: normal safe heel to toe progression    TODAY'S TREATMENT  05/23/22: Initiated HEP, initial eval completed   PATIENT EDUCATION:  Education details: Initiated HEP and educated on anatomy of the spine, MRI results.   Person educated: Patient Education method: Explanation, Demonstration, Verbal cues, and Handouts Education comprehension: verbalized understanding, returned demonstration, and verbal cues required   HOME EXERCISE PROGRAM: Access Code: [email or web address] URL: [email or web address] Date: 05/23/2022 Prepared by: Candyce Churn  Exercises - Standing Hamstring Stretch on Chair  - 1 x daily - 7 x weekly - 1 sets - 3 reps - 30 hold - Standing Quad Stretch with Table and Chair Support  - 1 x daily - 7 x weekly - 1 sets - 3 reps - 30 hold - Supine Figure 4 Piriformis Stretch  - 1 x daily - 7 x weekly - 1 sets - 3 reps - 30 hold - Supine ITB  Stretch with Strap  - 1 x daily -  7 x weekly - 1 sets - 3 reps - 30 hold - Seated Thoracic Extension and Rotation with Reach  - 1 x daily - 7 x weekly - 1 sets - 10 reps - 5 sec hold  ASSESSMENT:  CLINICAL IMPRESSION: Patient is a 36 y.o. female who was seen today for physical therapy evaluation and treatment for right sided low back pain. She presents with thoracic scoliosis, decreased LE flexibility, poor core strength and elevated pain in right low back.  She would benefit from skilled PT for LE flexibility exercises along with stretching and strengthening to address scoliosis, core strengthening and pain control.     OBJECTIVE IMPAIRMENTS difficulty walking, decreased ROM, decreased strength, increased fascial restrictions, increased muscle spasms, impaired flexibility, and pain.   ACTIVITY LIMITATIONS carrying, lifting, bending, sitting, standing, squatting, sleeping, and transfers  PARTICIPATION LIMITATIONS: meal prep, cleaning, laundry, driving, shopping, community activity, occupation, and yard work  PERSONAL FACTORS Behavior pattern, Fitness, Past/current experiences, Profession, Time since onset of injury/illness/exacerbation, and 1-2 comorbidities: depression, anxiety  are also affecting patient's functional outcome.   REHAB POTENTIAL: Good  CLINICAL DECISION MAKING: Evolving/moderate complexity  EVALUATION COMPLEXITY: Moderate   GOALS: Goals reviewed with patient? Yes  SHORT TERM GOALS: Target date: 06/20/2022  Patient will be independent with initial HEP  Baseline: Goal status: INITIAL  2.  Pain report to be no greater than 4/10  Baseline:  Goal status: INITIAL   LONG TERM GOALS: Target date: 07/18/2022  Patient to be independent with advanced HEP  Baseline:  Goal status: INITIAL  2.  Patient to report pain no greater than 2/10  Baseline:  Goal status: INITIAL  3.  Patient to be able to sit at her job without frequent rest breaks to relieve pain.    Baseline:  Goal status: INITIAL  4.  FOTO to be 84 Baseline:  Goal status: INITIAL  5.  Patient to report 75% improvement in overall symptoms Baseline:  Goal status: INITIAL     PLAN: PT FREQUENCY: 2x/week  PT DURATION: 8 weeks  PLANNED INTERVENTIONS: Therapeutic exercises, Therapeutic activity, Neuromuscular re-education, Patient/Family education, Joint mobilization, Aquatic Therapy, Dry Needling, Electrical stimulation, Spinal mobilization, Cryotherapy, Moist heat, Taping, Traction, Ultrasound, Manual therapy, and Re-evaluation.  PLAN FOR NEXT SESSION: NuStep, review HEP, add core strengthening   Vernell Barrier, PT 05/23/2022, 12:02 PM  [facility] [address], [address] [place], [place] [address] Phone # [phone] Fax [phone]

## 2022-05-24 ENCOUNTER — Ambulatory Visit (HOSPITAL_BASED_OUTPATIENT_CLINIC_OR_DEPARTMENT_OTHER): Payer: BC Managed Care – PPO | Admitting: Family Medicine

## 2022-06-06 ENCOUNTER — Ambulatory Visit (HOSPITAL_BASED_OUTPATIENT_CLINIC_OR_DEPARTMENT_OTHER): Payer: BC Managed Care – PPO | Admitting: Family Medicine

## 2022-06-18 DIAGNOSIS — R600 Localized edema: Secondary | ICD-10-CM | POA: Diagnosis not present

## 2022-06-20 ENCOUNTER — Ambulatory Visit: Payer: BC Managed Care – PPO | Attending: Family Medicine

## 2022-06-20 DIAGNOSIS — R252 Cramp and spasm: Secondary | ICD-10-CM | POA: Insufficient documentation

## 2022-06-20 DIAGNOSIS — M6281 Muscle weakness (generalized): Secondary | ICD-10-CM | POA: Insufficient documentation

## 2022-06-20 DIAGNOSIS — M5459 Other low back pain: Secondary | ICD-10-CM | POA: Diagnosis not present

## 2022-06-20 DIAGNOSIS — R293 Abnormal posture: Secondary | ICD-10-CM | POA: Diagnosis not present

## 2022-06-20 DIAGNOSIS — R262 Difficulty in walking, not elsewhere classified: Secondary | ICD-10-CM | POA: Insufficient documentation

## 2022-06-20 NOTE — Therapy (Addendum)
OUTPATIENT PHYSICAL THERAPY TREATMENT AND DISCHARGE NOTE   Patient Name: TATYM SCHERMER MRN: [id number] DOB:May 16, 1986, 36 y.o., female Today's Date: 06/20/2022  PCP: Lewis Moccasin, MD REFERRING PROVIDER: Lewis Moccasin, MD  END OF SESSION:   PT End of Session - 06/20/22 1229     Visit Number 2    Authorization Type BCBS    PT Start Time 1230    PT Stop Time 1320    PT Time Calculation (min) 50 min    Activity Tolerance Patient tolerated treatment well    Behavior During Therapy WFL for tasks assessed/performed             Past Medical History:  Diagnosis Date   Anxiety    Depression    DVT (deep venous thrombosis) (HCC)    Generalized headaches    GERD (gastroesophageal reflux disease)    Hyperlipidemia    Migraine    PE (pulmonary thromboembolism) (HCC)    Ulcer    Past Surgical History:  Procedure Laterality Date   bunionectomy      EYE SURGERY     TONSILLECTOMY AND ADENOIDECTOMY     Patient Active Problem List   Diagnosis Date Noted   GERD (gastroesophageal reflux disease) 09/01/2020   Anxiety 09/16/2017   History of DVT (deep vein thrombosis) 03/21/2017   History of pulmonary embolism 03/21/2017   Food allergy 11/08/2016   Palpitations 02/27/2016   Hemorrhoids 09/29/2015    REFERRING DIAG: M54.41 (ICD-10-CM) - Right-sided low back pain with right-sided sciatica, unspecified chronicity   THERAPY DIAG:  Other low back pain  Abnormal posture  Cramp and spasm  Difficulty in walking, not elsewhere classified  Muscle weakness (generalized)  Rationale for Evaluation and Treatment Rehabilitation  PERTINENT HISTORY: Lumbar degenerative changes noted on xray  PRECAUTIONS: none  SUBJECTIVE: Patient reports "I'm better"  Reports her pain at 7-8/10  PAIN:  Are you having pain? Yes: NPRS scale: 7-8/10 Pain location: Right low back Pain description: dull ache Aggravating factors: sitting for prolonged periods of time at work,  prolonged standing Relieving factors: meds, ice, stretching   OBJECTIVE: (objective measures completed at initial evaluation unless otherwise dated)  OBJECTIVE:    DIAGNOSTIC FINDINGS:  IMPRESSION: Lumbar spondylosis, as outlined and with findings most notably as follows.   At L4-L5, there is mild disc degeneration. Small disc bulge. Facet arthrosis (mild right, mild-to-moderate left) with ligamentum flavum hypertrophy. Minimal relative left subarticular narrowing (without nerve root impingement). Mild relative left neural foraminal narrowing.   No significant spinal canal or foraminal stenosis at the remaining levels.   Mild edema within the posterior elements bilaterally at L4-L5, likely degenerative and related to facet arthrosis.   Thoracolumbar dextrocurvature.   Trace grade 1 retrolisthesis at L3-L4 and L4-L5.   PATIENT SURVEYS:  FOTO 79 (goal is 44)   SCREENING FOR RED FLAGS: Bowel or bladder incontinence: No Spinal tumors: No Cauda equina syndrome: No Compression fracture: No Abdominal aneurysm: No   COGNITION:           Overall cognitive status: Within functional limits for tasks assessed                          SENSATION: WFL   MUSCLE LENGTH: Hamstrings: Right 50 deg; Left 60 deg Thomas test: Right pos ; Left pos    POSTURE:  thoracic scoliosis, worsens with fwd flexion, trunk shift to right in sitting   PALPATION: Tender right S.I.  LUMBAR ROM:    Active  A/PROM  eval  Flexion WNL but with compensation/ thoracic scoliosis noted  Extension WNL with pain  Right lateral flexion To joint line  Left lateral flexion To joint line  Right rotation WNL  Left rotation WNL   (Blank rows = not tested)   LOWER EXTREMITY ROM:      WFL   LOWER EXTREMITY MMT:     Generally 4+ to 5/5 (mild weakness in bilateral hip ER's)   LUMBAR SPECIAL TESTS:  Straight leg raise test: Negative   GAIT: Distance walked: 50 Assistive device utilized:  None Level of assistance: Complete Independence Comments: normal safe heel to toe progression       TODAY'S TREATMENT  06/20/22: NuStep x 5 min level 5 Standing at steps: hamstring stretch 3 x 30 sec both  Quad stretch 3 x 30 sec both Piriformis stretch 2 x 30 sec each Supine IT band stretch 2 x 30 sec PPT x 10 PPT with 90/90 heel tap x 20 PPT with dying bug x 20 PPT with hip flexion, then extend knee, alternating x 20 Ice to lumbar spine x 10 min  TODAY'S TREATMENT  05/23/22: Initiated HEP, initial eval completed     PATIENT EDUCATION:  Education details: Initiated HEP and educated on anatomy of the spine, MRI results.   Person educated: Patient Education method: Explanation, Demonstration, Verbal cues, and Handouts Education comprehension: verbalized understanding, returned demonstration, and verbal cues required     HOME EXERCISE PROGRAM: Access Code: [email or web address] URL: [email or web address] Date: 06/20/2022 Prepared by: Candyce Churn  Exercises - Standing Hamstring Stretch on Chair  - 1 x daily - 7 x weekly - 1 sets - 3 reps - 30 hold - Standing Quad Stretch with Table and Chair Support  - 1 x daily - 7 x weekly - 1 sets - 3 reps - 30 hold - Supine Figure 4 Piriformis Stretch  - 1 x daily - 7 x weekly - 1 sets - 3 reps - 30 hold - Supine ITB Stretch with Strap  - 1 x daily - 7 x weekly - 1 sets - 3 reps - 30 hold - Seated Thoracic Extension and Rotation with Reach  - 1 x daily - 7 x weekly - 1 sets - 10 reps - 5 sec hold - Supine Posterior Pelvic Tilt  - 1 x daily - 7 x weekly - 3 sets - 10 reps - Supine 90/90 Alternating Heel Touches with Posterior Pelvic Tilt  - 1 x daily - 7 x weekly - 3 sets - 10 reps - Supine Dead Bug with Leg Extension  - 1 x daily - 7 x weekly - 3 sets - 10 reps   ASSESSMENT:   CLINICAL IMPRESSION: Rayme responded very well to initial HEP.  She reports decrease in pain and is finding time to do her HEP consistently.  She was  able to tolerate addition of core strength with some difficulty with maintaining neutral pelvis.  She would benefit from skilled PT for LE flexibility exercises along with stretching and strengthening to address scoliosis, core strengthening and pain control.       OBJECTIVE IMPAIRMENTS difficulty walking, decreased ROM, decreased strength, increased fascial restrictions, increased muscle spasms, impaired flexibility, and pain.    ACTIVITY LIMITATIONS carrying, lifting, bending, sitting, standing, squatting, sleeping, and transfers   PARTICIPATION LIMITATIONS: meal prep, cleaning, laundry, driving, shopping, community activity, occupation, and yard work   PERSONAL FACTORS Behavior pattern, Fitness, Past/current experiences, Profession,  Time since onset of injury/illness/exacerbation, and 1-2 comorbidities: depression, anxiety  are also affecting patient's functional outcome.    REHAB POTENTIAL: Good   CLINICAL DECISION MAKING: Evolving/moderate complexity   EVALUATION COMPLEXITY: Moderate     GOALS: Goals reviewed with patient? Yes   SHORT TERM GOALS: Target date: 06/20/2022   Patient will be independent with initial HEP  Baseline: Goal status: MET   2.  Pain report to be no greater than 4/10  Baseline:  Goal status: MET     LONG TERM GOALS: Target date: 07/18/2022   Patient to be independent with advanced HEP  Baseline:  Goal status: INITIAL   2.  Patient to report pain no greater than 2/10  Baseline:  Goal status: INITIAL   3.  Patient to be able to sit at her job without frequent rest breaks to relieve pain.   Baseline:  Goal status: INITIAL   4.  FOTO to be 84 Baseline:  Goal status: INITIAL   5.  Patient to report 75% improvement in overall symptoms Baseline:  Goal status: INITIAL         PLAN: PT FREQUENCY: 2x/week   PT DURATION: 8 weeks   PLANNED INTERVENTIONS: Therapeutic exercises, Therapeutic activity, Neuromuscular re-education, Patient/Family  education, Joint mobilization, Aquatic Therapy, Dry Needling, Electrical stimulation, Spinal mobilization, Cryotherapy, Moist heat, Taping, Traction, Ultrasound, Manual therapy, and Re-evaluation.   PLAN FOR NEXT SESSION: NuStep, review HEP, add core strengthening    PHYSICAL THERAPY DISCHARGE SUMMARY  Visits from Start of Care: 2  Current functional level related to goals / functional outcomes: See above   Remaining deficits: See above   Education / Equipment: See above   Patient agrees to discharge. Patient goals were partially met. Patient is being discharged due to being pleased with the current functional level.   Anderson Malta B. Imunique Samad, PT 08/02/22 12:01 PM  [facility] [address] [place], [place] [address] Phone # [phone] Fax [phone]

## 2022-06-21 DIAGNOSIS — H6990 Unspecified Eustachian tube disorder, unspecified ear: Secondary | ICD-10-CM | POA: Diagnosis not present

## 2022-06-21 DIAGNOSIS — R600 Localized edema: Secondary | ICD-10-CM | POA: Diagnosis not present

## 2022-06-26 ENCOUNTER — Encounter: Payer: Self-pay | Admitting: Gastroenterology

## 2022-06-29 ENCOUNTER — Telehealth: Payer: Self-pay | Admitting: Pharmacy Technician

## 2022-06-29 ENCOUNTER — Other Ambulatory Visit (HOSPITAL_COMMUNITY): Payer: Self-pay

## 2022-06-29 ENCOUNTER — Other Ambulatory Visit: Payer: Self-pay

## 2022-06-29 MED ORDER — PANTOPRAZOLE SODIUM 40 MG PO TBEC: 40.0000 mg | DELAYED_RELEASE_TABLET | Freq: Every day | ORAL | 1 refills | Status: DC

## 2022-06-29 NOTE — Telephone Encounter (Addendum)
Patient Advocate Encounter  Prior Authorization for PANTOPRAZLE 40MG  has been approved.    PA# --- Effective dates: 8.4.23 through 8.2.24  Jamire Shabazz B. CPhT P: [id number] F: [phone]   Received notification from [employer] that prior authorization for PANTOPRAZOLE 40MG  is required.   PA submitted on 8.4.23 Key [id number]  Status is pending    , CPhT Patient Advocate Phone: [phone]

## 2022-07-05 DIAGNOSIS — R42 Dizziness and giddiness: Secondary | ICD-10-CM | POA: Diagnosis not present

## 2022-07-05 DIAGNOSIS — J329 Chronic sinusitis, unspecified: Secondary | ICD-10-CM | POA: Diagnosis not present

## 2022-07-05 DIAGNOSIS — R519 Headache, unspecified: Secondary | ICD-10-CM | POA: Diagnosis not present

## 2022-07-11 ENCOUNTER — Ambulatory Visit: Payer: BC Managed Care – PPO

## 2022-07-12 DIAGNOSIS — F419 Anxiety disorder, unspecified: Secondary | ICD-10-CM | POA: Diagnosis not present

## 2022-07-18 DIAGNOSIS — Z124 Encounter for screening for malignant neoplasm of cervix: Secondary | ICD-10-CM | POA: Diagnosis not present

## 2022-07-18 DIAGNOSIS — Z6829 Body mass index (BMI) 29.0-29.9, adult: Secondary | ICD-10-CM | POA: Diagnosis not present

## 2022-07-18 DIAGNOSIS — Z01411 Encounter for gynecological examination (general) (routine) with abnormal findings: Secondary | ICD-10-CM | POA: Diagnosis not present

## 2022-07-18 DIAGNOSIS — Z0142 Encounter for cervical smear to confirm findings of recent normal smear following initial abnormal smear: Secondary | ICD-10-CM | POA: Diagnosis not present

## 2022-07-18 DIAGNOSIS — Z01419 Encounter for gynecological examination (general) (routine) without abnormal findings: Secondary | ICD-10-CM | POA: Diagnosis not present

## 2022-07-23 ENCOUNTER — Ambulatory Visit: Payer: BC Managed Care – PPO | Admitting: Gastroenterology

## 2022-07-25 DIAGNOSIS — F419 Anxiety disorder, unspecified: Secondary | ICD-10-CM | POA: Diagnosis not present

## 2022-08-06 DIAGNOSIS — M47816 Spondylosis without myelopathy or radiculopathy, lumbar region: Secondary | ICD-10-CM | POA: Diagnosis not present

## 2022-08-15 DIAGNOSIS — F419 Anxiety disorder, unspecified: Secondary | ICD-10-CM | POA: Diagnosis not present

## 2022-08-16 ENCOUNTER — Other Ambulatory Visit: Payer: Self-pay | Admitting: *Deleted

## 2022-08-16 DIAGNOSIS — R600 Localized edema: Secondary | ICD-10-CM

## 2022-08-22 ENCOUNTER — Encounter (HOSPITAL_COMMUNITY): Payer: BC Managed Care – PPO

## 2022-09-03 DIAGNOSIS — M47816 Spondylosis without myelopathy or radiculopathy, lumbar region: Secondary | ICD-10-CM | POA: Diagnosis not present

## 2022-09-10 DIAGNOSIS — J329 Chronic sinusitis, unspecified: Secondary | ICD-10-CM | POA: Diagnosis not present

## 2022-09-10 DIAGNOSIS — J309 Allergic rhinitis, unspecified: Secondary | ICD-10-CM | POA: Diagnosis not present

## 2022-09-11 DIAGNOSIS — F419 Anxiety disorder, unspecified: Secondary | ICD-10-CM | POA: Diagnosis not present

## 2022-09-12 DIAGNOSIS — M47816 Spondylosis without myelopathy or radiculopathy, lumbar region: Secondary | ICD-10-CM | POA: Diagnosis not present

## 2022-09-12 DIAGNOSIS — Z6829 Body mass index (BMI) 29.0-29.9, adult: Secondary | ICD-10-CM | POA: Diagnosis not present

## 2022-09-26 DIAGNOSIS — R52 Pain, unspecified: Secondary | ICD-10-CM | POA: Diagnosis not present

## 2022-09-26 DIAGNOSIS — J029 Acute pharyngitis, unspecified: Secondary | ICD-10-CM | POA: Diagnosis not present

## 2022-09-26 DIAGNOSIS — Z1152 Encounter for screening for COVID-19: Secondary | ICD-10-CM | POA: Diagnosis not present

## 2022-09-26 DIAGNOSIS — R059 Cough, unspecified: Secondary | ICD-10-CM | POA: Diagnosis not present

## 2022-10-08 ENCOUNTER — Ambulatory Visit (HOSPITAL_COMMUNITY): Payer: BC Managed Care – PPO

## 2022-10-17 DIAGNOSIS — Z114 Encounter for screening for human immunodeficiency virus [HIV]: Secondary | ICD-10-CM | POA: Diagnosis not present

## 2022-10-17 DIAGNOSIS — Z13220 Encounter for screening for lipoid disorders: Secondary | ICD-10-CM | POA: Diagnosis not present

## 2022-10-17 DIAGNOSIS — F419 Anxiety disorder, unspecified: Secondary | ICD-10-CM | POA: Diagnosis not present

## 2022-10-17 DIAGNOSIS — Z0000 Encounter for general adult medical examination without abnormal findings: Secondary | ICD-10-CM | POA: Diagnosis not present

## 2022-10-23 DIAGNOSIS — M47816 Spondylosis without myelopathy or radiculopathy, lumbar region: Secondary | ICD-10-CM | POA: Diagnosis not present

## 2022-10-24 DIAGNOSIS — Z23 Encounter for immunization: Secondary | ICD-10-CM | POA: Diagnosis not present

## 2022-10-24 DIAGNOSIS — Z0000 Encounter for general adult medical examination without abnormal findings: Secondary | ICD-10-CM | POA: Diagnosis not present

## 2022-11-07 DIAGNOSIS — M47816 Spondylosis without myelopathy or radiculopathy, lumbar region: Secondary | ICD-10-CM | POA: Diagnosis not present

## 2022-12-26 DIAGNOSIS — E669 Obesity, unspecified: Secondary | ICD-10-CM | POA: Diagnosis not present

## 2022-12-26 DIAGNOSIS — M545 Low back pain, unspecified: Secondary | ICD-10-CM | POA: Diagnosis not present

## 2022-12-26 DIAGNOSIS — M549 Dorsalgia, unspecified: Secondary | ICD-10-CM | POA: Diagnosis not present

## 2022-12-26 DIAGNOSIS — G8929 Other chronic pain: Secondary | ICD-10-CM | POA: Diagnosis not present

## 2022-12-27 ENCOUNTER — Telehealth: Payer: Self-pay | Admitting: Gastroenterology

## 2022-12-27 NOTE — Telephone Encounter (Signed)
Inbound call from patient stating that she has been having rectal bleeding since last night and normally she will see a little blood after having a bowel movement but she has not had one. Patient made an appointment with Vicie Mutters on 2/21 at 3:00 but is requesting a call back to discuss and to see if she can be seen any sooner. Please advise.

## 2022-12-28 NOTE — Telephone Encounter (Addendum)
Appointment scheduled for 01/16/23 with APP. Spoke with the patient. She has had constipation in the past week. Reports "a couple of days where it was really hard."  She is now seeing blood after the bowel movement and reports a stinging sensation. Denies feeling any bulging at her rectum. She does not take any stool softeners on a daily basis. She takes Benefiber as needed.

## 2022-12-28 NOTE — Telephone Encounter (Signed)
Please advise patient to start taking Benefiber 1 tablespoon 3 times daily with meals and add daily stool softener/docusate at bedtime.  Agree with office follow-up visit, will need rectal exam to exclude anal fissure or any other etiology.  Thank you

## 2022-12-28 NOTE — Telephone Encounter (Signed)
Patient advised. Appointment 01/07/23 at 1:30 pm.

## 2023-01-07 ENCOUNTER — Other Ambulatory Visit (INDEPENDENT_AMBULATORY_CARE_PROVIDER_SITE_OTHER): Payer: BC Managed Care – PPO

## 2023-01-07 ENCOUNTER — Ambulatory Visit: Payer: BC Managed Care – PPO | Admitting: Nurse Practitioner

## 2023-01-07 ENCOUNTER — Encounter: Payer: Self-pay | Admitting: Nurse Practitioner

## 2023-01-07 VITALS — BP 132/78 | HR 82 | Ht 65.0 in | Wt 175.8 lb

## 2023-01-07 DIAGNOSIS — R194 Change in bowel habit: Secondary | ICD-10-CM | POA: Diagnosis not present

## 2023-01-07 DIAGNOSIS — R110 Nausea: Secondary | ICD-10-CM

## 2023-01-07 DIAGNOSIS — R1084 Generalized abdominal pain: Secondary | ICD-10-CM

## 2023-01-07 LAB — CBC WITH DIFFERENTIAL/PLATELET
Basophils Absolute: 0.1 10*3/uL (ref 0.0–0.1)
Basophils Relative: 1.3 % (ref 0.0–3.0)
Eosinophils Absolute: 0.2 10*3/uL (ref 0.0–0.7)
Eosinophils Relative: 3.2 % (ref 0.0–5.0)
HCT: 41.7 % (ref 36.0–46.0)
Hemoglobin: 14.2 g/dL (ref 12.0–15.0)
Lymphocytes Relative: 32.5 % (ref 12.0–46.0)
Lymphs Abs: 2.2 10*3/uL (ref 0.7–4.0)
MCHC: 33.9 g/dL (ref 30.0–36.0)
MCV: 86.4 fl (ref 78.0–100.0)
Monocytes Absolute: 0.6 10*3/uL (ref 0.1–1.0)
Monocytes Relative: 8.9 % (ref 3.0–12.0)
Neutro Abs: 3.7 10*3/uL (ref 1.4–7.7)
Neutrophils Relative %: 54.1 % (ref 43.0–77.0)
Platelets: 203 10*3/uL (ref 150.0–400.0)
RBC: 4.83 Mil/uL (ref 3.87–5.11)
RDW: 12.5 % (ref 11.5–15.5)
WBC: 6.7 10*3/uL (ref 4.0–10.5)

## 2023-01-07 LAB — TSH: TSH: 0.87 u[IU]/mL (ref 0.35–5.50)

## 2023-01-07 MED ORDER — ONDANSETRON HCL 4 MG PO TABS: 4.0000 mg | ORAL_TABLET | Freq: Two times a day (BID) | ORAL | 0 refills | Status: AC | PRN

## 2023-01-07 MED ORDER — DICYCLOMINE HCL 10 MG PO CAPS: 10.0000 mg | ORAL_CAPSULE | Freq: Three times a day (TID) | ORAL | 2 refills | Status: DC | PRN

## 2023-01-07 NOTE — Patient Instructions (Signed)
We have sent the following medications to your pharmacy for you to pick up at your convenience: Bentyl, zofran  Your provider has requested that you go to the basement level for lab work before leaving today. Press "B" on the elevator. The lab is located at the first door on the left as you exit the elevator.  Due to recent changes in healthcare laws, you may see the results of your imaging and laboratory studies on MyChart before your provider has had a chance to review them.  We understand that in some cases there may be results that are confusing or concerning to you. Not all laboratory results come back in the same time frame and the provider may be waiting for multiple results in order to interpret others.  Please give [place] 48 hours in order for your provider to thoroughly review all the results before contacting the office for clarification of your results.   I appreciate the opportunity to care for you. Tye Savoy, NP-C

## 2023-01-07 NOTE — Progress Notes (Signed)
Assessment    # 37 year old female with generalized abdominal pain, nausea, bowel changes and mild weight loss.  Had diarrhea last week and since then stools have been the consistency of "soft serve".  She may have had gastroenteritis and now with some post-infectious IBS. Non-toxic appearing.    Plan   Stool for leukocytes. CBC, TSH  Not having diarrhea so will hold off on stool pathogen panel / C-diff testing right now.  Dicyclomine 10 mg TID prn abdominal cramps Zofran 4 mg Q 12 hours prn nausea # 20 Follow up with me in ~ 3 weeks . Call in the interim if getting worse.  After she left clinic I realized that she told me she was taking miralax but it isn't on home med list. She has been taking it BID. Asked her to reduce to once daily for now and continue daily fiber.   HPI    Chief complaint:    Stacy Wilkins is a 37 y.o. female known to Stacy Wilkins with a past medical history of hemorrhoids, IBS, chronic abdominal pain, constipation, PUD, post-op DVT / bilateral PE, anxiety. See PMH / [place] for additional details.   [name] was seen May 2023 for chronic constipation and persistent hemorrhoid bleeding.  We escalated her bowel regimen and added Benefiber twice a day to MiraLAX.  Stacy Wilkins had previously spoken with her about banding her internal hemorrhoids and she was interested.  I arranged for hemorrhoidal banding.  She was seen in the office on 05/09/2022 and found to have an anal fissure as well as grade 1 hemorrhoids.  She was started on nitroglycerin and the decision was made not to band the internal hemorrhoids. The bleeding stopped with fissure treatment   Stacy Wilkins contacted [place] on 12/27/2022 with constipation and recurrent rectal bleeding.  Benefiber was increased to 3 times a day and stool softener was added at bedtime. Constipation and bleeding stopped and she began having 1-2 formed BMs a day. She is no longer taking stool softeners. She is taking benefiber only once a day  and miralax twice a day.   Interval History:  She had originally made this appointment due to the rectal bleeding which has stopped but she now has other issues to discuss. She has been under a lot of stress due to cat being sick.  She has developed some bowel changes over last several days.  Last week she had a couple of episodes of diarrhea.  The diarrhea resolved but now her stools have been the consistency of  " soft serve" .  Frequency is normal with only one BM a day.  She has associated generalized abdominal cramps and nausea.  No fevers.  No recent antibiotics.  No recent medication changes other than addition of stool softeners several days ago which she is no longer taking. No dietary changes to correlate with bowel changes. No out of country travel.  Her weight is down about 5 pounds.    Previous GI Evaluation  Oct 2021 colonoscopy for abdominal pain  - Mild diverticulosis in the sigmoid colon, in the descending colon and in the transverse colon. - Non-bleeding internal hemorrhoids. - No specimens collected    Past Medical History:  Diagnosis Date   Anxiety    Depression    DVT (deep venous thrombosis) (HCC)    Generalized headaches    GERD (gastroesophageal reflux disease)    Hyperlipidemia    Migraine    PE (pulmonary thromboembolism) ([place])  Ulcer     Past Surgical History:  Procedure Laterality Date   bunionectomy      EYE SURGERY     TONSILLECTOMY AND ADENOIDECTOMY      Current Medications, Allergies, Family History and Social History were reviewed in [employer] record.     Current Outpatient Medications  Medication Sig Dispense Refill   AMBULATORY NON FORMULARY MEDICATION Nitroglycerine ointment 0.125 %  Apply a pea sized amount internally three to four times daily. Dispense 30 GM one refill 30 g 1   doxycycline (VIBRAMYCIN) 100 MG capsule Take 100 mg by mouth daily.     etonogestrel (NEXPLANON) 42 MG IMPL implant See admin  instructions.     fluticasone (FLONASE) 50 MCG/ACT nasal spray SPRAY 2 SPRAYS INTO EACH NOSTRIL EVERY DAY (Patient taking differently: Place 2 sprays into both nostrils daily as needed for allergies or rhinitis.) 16 g 1   methylPREDNISolone (MEDROL DOSEPAK) 4 MG TBPK tablet Take per package instructions 1 each 0   Multiple Vitamins-Minerals (THERA-M) TABS Take 1 tablet by mouth daily.      pantoprazole (PROTONIX) 40 MG tablet Take 1 tablet (40 mg total) by mouth daily. 30 tablet 1   propranolol ER (INDERAL LA) 60 MG 24 hr capsule TAKE 1 CAPSULE BY MOUTH EVERY DAY 90 capsule 1   valACYclovir (VALTREX) 500 MG tablet Take 1 tablet by mouth as needed.      No current facility-administered medications for this visit.    Review of Systems: No chest pain. No shortness of breath. No urinary complaints.    Physical Exam  Wt Readings from Last 3 Encounters:  05/09/22 181 lb (82.1 kg)  04/24/22 180 lb 12.4 oz (82 kg)  04/11/22 181 lb (82.1 kg)    BP 132/78   Pulse 82   Ht 5' 5"$  (1.651 m)   Wt 175 lb 12.8 oz (79.7 kg)   SpO2 98%   BMI 29.25 kg/m  Constitutional:  Pleasant, generally well appearing female in no acute distress. Psychiatric: Normal mood and affect. Behavior is normal. EENT: Pupils normal.  Conjunctivae are normal. No scleral icterus. Neck supple.  Cardiovascular: Normal rate, regular rhythm.  Pulmonary/chest: Effort normal and breath sounds normal. No wheezing, rales or rhonchi. Abdominal: Soft, nondistended, nontender. Bowel sounds active throughout. There are no masses palpable. No hepatomegaly. Neurological: Alert and oriented to person place and time.  Skin: Skin is warm and dry. No rashes noted.  Stacy Savoy, NP  01/07/2023, 1:28 PM

## 2023-01-09 ENCOUNTER — Other Ambulatory Visit: Payer: BC Managed Care – PPO

## 2023-01-09 DIAGNOSIS — E669 Obesity, unspecified: Secondary | ICD-10-CM | POA: Diagnosis not present

## 2023-01-09 DIAGNOSIS — R194 Change in bowel habit: Secondary | ICD-10-CM | POA: Diagnosis not present

## 2023-01-09 DIAGNOSIS — R110 Nausea: Secondary | ICD-10-CM

## 2023-01-09 DIAGNOSIS — R1084 Generalized abdominal pain: Secondary | ICD-10-CM | POA: Diagnosis not present

## 2023-01-10 DIAGNOSIS — N390 Urinary tract infection, site not specified: Secondary | ICD-10-CM | POA: Diagnosis not present

## 2023-01-10 LAB — FECAL LACTOFERRIN, QUANT
Fecal Lactoferrin: NEGATIVE
MICRO NUMBER:: 14564601
SPECIMEN QUALITY:: ADEQUATE

## 2023-01-16 ENCOUNTER — Ambulatory Visit: Payer: BC Managed Care – PPO | Admitting: Physician Assistant

## 2023-01-16 DIAGNOSIS — Z713 Dietary counseling and surveillance: Secondary | ICD-10-CM | POA: Diagnosis not present

## 2023-01-16 DIAGNOSIS — N390 Urinary tract infection, site not specified: Secondary | ICD-10-CM | POA: Diagnosis not present

## 2023-01-16 DIAGNOSIS — E669 Obesity, unspecified: Secondary | ICD-10-CM | POA: Diagnosis not present

## 2023-01-24 DIAGNOSIS — N760 Acute vaginitis: Secondary | ICD-10-CM | POA: Diagnosis not present

## 2023-01-24 DIAGNOSIS — N390 Urinary tract infection, site not specified: Secondary | ICD-10-CM | POA: Diagnosis not present

## 2023-01-24 DIAGNOSIS — R300 Dysuria: Secondary | ICD-10-CM | POA: Diagnosis not present

## 2023-02-05 DIAGNOSIS — K219 Gastro-esophageal reflux disease without esophagitis: Secondary | ICD-10-CM | POA: Diagnosis not present

## 2023-02-08 ENCOUNTER — Emergency Department (HOSPITAL_BASED_OUTPATIENT_CLINIC_OR_DEPARTMENT_OTHER)
Admission: EM | Admit: 2023-02-08 | Discharge: 2023-02-09 | Disposition: A | Discharge status: Home / Self Care | Payer: BC Managed Care – PPO | Attending: Emergency Medicine | Admitting: Emergency Medicine

## 2023-02-08 ENCOUNTER — Encounter (HOSPITAL_BASED_OUTPATIENT_CLINIC_OR_DEPARTMENT_OTHER): Payer: Self-pay

## 2023-02-08 ENCOUNTER — Other Ambulatory Visit: Payer: Self-pay

## 2023-02-08 ENCOUNTER — Emergency Department (HOSPITAL_BASED_OUTPATIENT_CLINIC_OR_DEPARTMENT_OTHER): Payer: BC Managed Care – PPO

## 2023-02-08 DIAGNOSIS — K209 Esophagitis, unspecified without bleeding: Secondary | ICD-10-CM | POA: Diagnosis not present

## 2023-02-08 DIAGNOSIS — R079 Chest pain, unspecified: Secondary | ICD-10-CM | POA: Diagnosis not present

## 2023-02-08 DIAGNOSIS — R0789 Other chest pain: Secondary | ICD-10-CM | POA: Diagnosis not present

## 2023-02-08 DIAGNOSIS — R209 Unspecified disturbances of skin sensation: Secondary | ICD-10-CM | POA: Insufficient documentation

## 2023-02-08 DIAGNOSIS — R918 Other nonspecific abnormal finding of lung field: Secondary | ICD-10-CM | POA: Diagnosis not present

## 2023-02-08 DIAGNOSIS — M79605 Pain in left leg: Secondary | ICD-10-CM | POA: Insufficient documentation

## 2023-02-08 LAB — CBC WITH DIFFERENTIAL/PLATELET
Abs Immature Granulocytes: 0.01 K/uL (ref 0.00–0.07)
Basophils Absolute: 0 K/uL (ref 0.0–0.1)
Basophils Relative: 1 %
Eosinophils Absolute: 0.2 K/uL (ref 0.0–0.5)
Eosinophils Relative: 2 %
HCT: 39.5 % (ref 36.0–46.0)
Hemoglobin: 13.6 g/dL (ref 12.0–15.0)
Immature Granulocytes: 0 %
Lymphocytes Relative: 31 %
Lymphs Abs: 2.6 K/uL (ref 0.7–4.0)
MCH: 29.3 pg (ref 26.0–34.0)
MCHC: 34.4 g/dL (ref 30.0–36.0)
MCV: 85.1 fL (ref 80.0–100.0)
Monocytes Absolute: 0.6 K/uL (ref 0.1–1.0)
Monocytes Relative: 7 %
Neutro Abs: 5 K/uL (ref 1.7–7.7)
Neutrophils Relative %: 59 %
Platelets: 210 K/uL (ref 150–400)
RBC: 4.64 MIL/uL (ref 3.87–5.11)
RDW: 11.9 % (ref 11.5–15.5)
WBC: 8.4 K/uL (ref 4.0–10.5)
nRBC: 0 % (ref 0.0–0.2)

## 2023-02-08 LAB — BASIC METABOLIC PANEL WITH GFR
Anion gap: 7 (ref 5–15)
BUN: 9 mg/dL (ref 6–20)
CO2: 25 mmol/L (ref 22–32)
Calcium: 9.3 mg/dL (ref 8.9–10.3)
Chloride: 105 mmol/L (ref 98–111)
Creatinine, Ser: 0.78 mg/dL (ref 0.44–1.00)
GFR, Estimated: >60 mL/min (ref >60)
Glucose, Bld: 141 mg/dL — ABNORMAL HIGH (ref 70–99)
Potassium: 3.5 mmol/L (ref 3.5–5.1)
Sodium: 137 mmol/L (ref 135–145)

## 2023-02-08 LAB — TROPONIN I (HIGH SENSITIVITY): Troponin I (High Sensitivity): <2 ng/L (ref <18)

## 2023-02-08 MED ORDER — IOHEXOL 350 MG/ML SOLN
100.0000 mL | Freq: Once | INTRAVENOUS | Status: AC | PRN
  Administered 2023-02-08: 80 mL via INTRAVENOUS

## 2023-02-08 NOTE — ED Notes (Signed)
Pt in CT.

## 2023-02-08 NOTE — ED Triage Notes (Signed)
POV from home, A&O x 4, GCS 15, amb to room.  C/o midsternal chest pain that started one week ago, left leg pain started a couple days ago. Hx of DVT and PE. No blood thinners currently. Went to UC and d dimer was positive.

## 2023-02-08 NOTE — ED Provider Notes (Signed)
[place] EMERGENCY DEPARTMENT AT [facility] Provider Note   CSN: [id number] Arrival date & time: 02/08/23  2133     History {Add pertinent medical, surgical, social history, OB history to HPI:1} Chief Complaint  Patient presents with   Chest Pain   Leg Pain    Stacy Wilkins is a 37 y.o. female, history of PE, DVT, provoked by surgery, who presents to the ED secondary to left leg pain that is been going on for the last week, states that it feels like the bottom of her foot is numb, and that is tender behind her left knee.  Denies any trauma, no use of oral contraceptives, no recent surgery.  Has not had any swelling or redness to the leg.  Denies any repetitive activities.  Currently not on blood thinners.  Additionally comes to the ER secondary to chest discomfort, that is been going on for the last couple days, states it feels tight in the middle of her chest, and achy, associated with need to take deep breath, no shortness of breath at baseline.  Went to urgent care today and was told that her D-dimer was elevated so she needed to come to the ER.  Denies any trauma.  No cardiac history.     Home Medications Prior to Admission medications   Medication Sig Start Date End Date Taking? Authorizing Provider  AMBULATORY NON FORMULARY MEDICATION Nitroglycerine ointment 0.125 %  Apply a pea sized amount internally three to four times daily. Dispense 30 GM one refill 05/09/22   Mauri Pole, MD  dicyclomine (BENTYL) 10 MG capsule Take 1 capsule (10 mg total) by mouth 3 (three) times daily as needed (cramps). 01/07/23   Willia Craze, NP  etonogestrel (NEXPLANON) 3 MG IMPL implant See admin instructions.    [provider]  fluticasone (FLONASE) 50 MCG/ACT nasal spray SPRAY 2 SPRAYS INTO EACH NOSTRIL EVERY DAY Patient not taking: Reported on 01/07/2023 04/18/18   Tenna Delaine D, PA-C  methylPREDNISolone (MEDROL DOSEPAK) 4 MG TBPK tablet Take per package  instructions Patient not taking: Reported on 01/07/2023 04/24/22   Roemhildt, Lorin T, PA-C  Multiple Vitamins-Minerals (THERA-M) TABS Take 1 tablet by mouth daily.     [provider]  ondansetron (ZOFRAN) 4 MG tablet Take 1 tablet (4 mg total) by mouth every 12 (twelve) hours as needed for nausea. 01/07/23   Willia Craze, NP  pantoprazole (PROTONIX) 40 MG tablet Take 1 tablet (40 mg total) by mouth daily. 06/29/22   Mauri Pole, MD  propranolol ER (INDERAL LA) 60 MG 24 hr capsule TAKE 1 CAPSULE BY MOUTH EVERY DAY 04/16/22   Cantwell, Celeste C, PA-C  valACYclovir (VALTREX) 500 MG tablet Take 1 tablet by mouth as needed.  08/27/19   [provider]      Allergies    Macrobid [nitrofurantoin], Esomeprazole magnesium, Bacitracin, Neo-bacit-poly-lidocaine, Nickel, Onion, and Other    Review of Systems   Review of Systems  Cardiovascular:  Positive for chest pain.    Physical Exam Updated Vital Signs Ht 5\' 5"  (1.651 m)   Wt 78 kg   BMI 28.62 kg/m  Physical Exam Vitals and nursing note reviewed.  Constitutional:      General: She is not in acute distress.    Appearance: She is well-developed.  HENT:     Head: Normocephalic and atraumatic.  Eyes:     Conjunctiva/sclera: Conjunctivae normal.  Cardiovascular:     Rate and Rhythm: Normal rate and  regular rhythm.     Heart sounds: No murmur heard. Pulmonary:     Effort: Pulmonary effort is normal. No respiratory distress.     Breath sounds: Normal breath sounds.  Abdominal:     Palpations: Abdomen is soft.     Tenderness: There is no abdominal tenderness.  Musculoskeletal:        General: No swelling.     Cervical back: Neck supple.     Comments: Left lower extremity: No edema, noted, tenderness to palpation of posterior venous system of the left leg along the gastroc, and popliteal area.  No erythema or induration noted.  Range of motion intact.  Skin:    General: Skin is warm and dry.     Capillary  Refill: Capillary refill takes less than 2 seconds.  Neurological:     Mental Status: She is alert.  Psychiatric:        Mood and Affect: Mood normal.     ED Results / Procedures / Treatments   Labs (all labs ordered are listed, but only abnormal results are displayed) Labs Reviewed  CBC WITH DIFFERENTIAL/PLATELET  BASIC METABOLIC PANEL  TROPONIN I (HIGH SENSITIVITY)    EKG None  Radiology No results found.  Procedures Procedures  {Document cardiac monitor, telemetry assessment procedure when appropriate:1}  Medications Ordered in ED Medications - No data to display  ED Course/ Medical Decision Making/ A&P   {   Click here for ABCD2, HEART and other calculatorsREFRESH Note before signing :1}                          Medical Decision Making Amount and/or Complexity of Data Reviewed Labs: ordered. Radiology: ordered.   ***  {Document critical care time when appropriate:1} {Document review of labs and clinical decision tools ie heart score, Chads2Vasc2 etc:1}  {Document your independent review of radiology images, and any outside records:1} {Document your discussion with family members, caretakers, and with consultants:1} {Document social determinants of health affecting pt's care:1} {Document your decision making why or why not admission, treatments were needed:1} Final Clinical Impression(s) / ED Diagnoses Final diagnoses:  None    Rx / DC Orders ED Discharge Orders     None

## 2023-02-08 NOTE — ED Notes (Signed)
Pt resting, updated on POC; reports tolerable pain, call light in reach.

## 2023-02-09 NOTE — ED Notes (Signed)
RN reviewed discharge instructions with pt. Pt verbalized understanding and had no further questions. VSS upon discharge.  

## 2023-02-09 NOTE — Discharge Instructions (Signed)
Please follow-up with your primary care doctor, please make sure you are resting, and you can use ice to help with your pain for your leg.  You can also can take Tylenol or ibuprofen for this.  Return to the ER if you have any swelling of your leg, your leg turns white, inability to move your leg, or lose feeling of your leg.  Return to the ER if you have severe chest pain or shortness of breath.

## 2023-02-12 ENCOUNTER — Ambulatory Visit: Payer: BC Managed Care – PPO | Admitting: Nurse Practitioner

## 2023-02-12 NOTE — Progress Notes (Deleted)
Assessment     Chronic constipation History of anal fissure Hx of hemorrhoids  Plan       HPI    Chief complaint: follow up   Stacy Wilkins is a 37 y.o. female known to Dr. Silverio Decamp with a past medical history of hemorrhoids, IBS, chronic abdominal pain, constipation, PUD, post-op DVT / bilateral PE, anxiety. See PMH / [place] for additional details.   Stacy Wilkins was seen 01/07/23 with abdominal pain, nausea, bowel changes.  Workup remarkable for normal CBC. Negative fecal lactoferrin.  Given trial of zofran and Bentyl. Continued on fiber. Reduced miralax to once daily.   Interval History:      Previous GI Evaluation      Imaging     Labs:     Latest Ref Rng & Units 02/08/2023   10:18 PM 01/07/2023    2:20 PM 04/24/2022    9:28 AM  CBC  WBC 4.0 - 10.5 K/uL 8.4  6.7  5.8   Hemoglobin 12.0 - 15.0 g/dL 13.6  14.2  12.7   Hematocrit 36.0 - 46.0 % 39.5  41.7  37.6   Platelets 150 - 400 K/uL 210  203.0  191        Latest Ref Rng & Units 04/24/2022    9:28 AM 09/27/2017    9:19 AM 03/19/2017    6:17 PM  Hepatic Function  Total Protein 6.5 - 8.1 g/dL 6.0  6.7  7.0   Albumin 3.5 - 5.0 g/dL 3.8  4.5  4.8   AST 15 - 41 U/L 18  14  26    ALT 0 - 44 U/L 19  10  26    Alk Phosphatase 38 - 126 U/L 40  58  69   Total Bilirubin 0.3 - 1.2 mg/dL 0.4  0.6  0.3      Past Medical History:  Diagnosis Date   Anxiety    Depression    DVT (deep venous thrombosis) (HCC)    Generalized headaches    GERD (gastroesophageal reflux disease)    Hyperlipidemia    Migraine    PE (pulmonary thromboembolism) ([place])    Ulcer     Past Surgical History:  Procedure Laterality Date   bunionectomy      EYE SURGERY     TONSILLECTOMY AND ADENOIDECTOMY      Current Medications, Allergies, Family History and Social History were reviewed in [employer] record.     Current Outpatient Medications  Medication Sig Dispense Refill   AMBULATORY NON FORMULARY  MEDICATION Nitroglycerine ointment 0.125 %  Apply a pea sized amount internally three to four times daily. Dispense 30 GM one refill 30 g 1   dicyclomine (BENTYL) 10 MG capsule Take 1 capsule (10 mg total) by mouth 3 (three) times daily as needed (cramps). 30 capsule 2   etonogestrel (NEXPLANON) 68 MG IMPL implant See admin instructions.     fluticasone (FLONASE) 50 MCG/ACT nasal spray SPRAY 2 SPRAYS INTO EACH NOSTRIL EVERY DAY (Patient not taking: Reported on 01/07/2023) 16 g 1   methylPREDNISolone (MEDROL DOSEPAK) 4 MG TBPK tablet Take per package instructions (Patient not taking: Reported on 01/07/2023) 1 each 0   Multiple Vitamins-Minerals (THERA-M) TABS Take 1 tablet by mouth daily.      ondansetron (ZOFRAN) 4 MG tablet Take 1 tablet (4 mg total) by mouth every 12 (twelve) hours as needed for nausea. 20 tablet 0   pantoprazole (PROTONIX) 40 MG tablet Take 1 tablet (40 mg  total) by mouth daily. 30 tablet 1   propranolol ER (INDERAL LA) 60 MG 24 hr capsule TAKE 1 CAPSULE BY MOUTH EVERY DAY 90 capsule 1   valACYclovir (VALTREX) 500 MG tablet Take 1 tablet by mouth as needed.      No current facility-administered medications for this visit.    Review of Systems: No chest pain. No shortness of breath. No urinary complaints.    Physical Exam  Wt Readings from Last 3 Encounters:  02/08/23 172 lb (78 kg)  01/07/23 175 lb 12.8 oz (79.7 kg)  05/09/22 181 lb (82.1 kg)    There were no vitals taken for this visit. Constitutional:  Pleasant, generally well appearing ***female in no acute distress. Psychiatric: Normal mood and affect. Behavior is normal. EENT: Pupils normal.  Conjunctivae are normal. No scleral icterus. Neck supple.  Cardiovascular: Normal rate, regular rhythm.  Pulmonary/chest: Effort normal and breath sounds normal. No wheezing, rales or rhonchi. Abdominal: Soft, nondistended, nontender. Bowel sounds active throughout. There are no masses palpable. No  hepatomegaly. Neurological: Alert and oriented to person place and time.  Extremities: *** edema Skin: Skin is warm and dry. No rashes noted.  Tye Savoy, NP  02/12/2023, 11:01 AM  Cc:  Fanny Bien, MD

## 2023-02-28 DIAGNOSIS — Z1159 Encounter for screening for other viral diseases: Secondary | ICD-10-CM | POA: Diagnosis not present

## 2023-02-28 DIAGNOSIS — H1030 Unspecified acute conjunctivitis, unspecified eye: Secondary | ICD-10-CM | POA: Diagnosis not present

## 2023-02-28 DIAGNOSIS — U071 COVID-19: Secondary | ICD-10-CM | POA: Diagnosis not present

## 2023-03-07 DIAGNOSIS — B9689 Other specified bacterial agents as the cause of diseases classified elsewhere: Secondary | ICD-10-CM | POA: Diagnosis not present

## 2023-03-07 DIAGNOSIS — U071 COVID-19: Secondary | ICD-10-CM | POA: Diagnosis not present

## 2023-03-07 DIAGNOSIS — J329 Chronic sinusitis, unspecified: Secondary | ICD-10-CM | POA: Diagnosis not present

## 2023-03-07 DIAGNOSIS — J069 Acute upper respiratory infection, unspecified: Secondary | ICD-10-CM | POA: Diagnosis not present

## 2023-03-21 DIAGNOSIS — N3000 Acute cystitis without hematuria: Secondary | ICD-10-CM | POA: Diagnosis not present

## 2023-03-21 DIAGNOSIS — N760 Acute vaginitis: Secondary | ICD-10-CM | POA: Diagnosis not present

## 2023-03-21 DIAGNOSIS — R7989 Other specified abnormal findings of blood chemistry: Secondary | ICD-10-CM | POA: Diagnosis not present

## 2023-04-04 DIAGNOSIS — G4700 Insomnia, unspecified: Secondary | ICD-10-CM | POA: Diagnosis not present

## 2023-04-04 DIAGNOSIS — R519 Headache, unspecified: Secondary | ICD-10-CM | POA: Diagnosis not present

## 2023-04-04 DIAGNOSIS — F411 Generalized anxiety disorder: Secondary | ICD-10-CM | POA: Diagnosis not present

## 2023-04-04 DIAGNOSIS — G43109 Migraine with aura, not intractable, without status migrainosus: Secondary | ICD-10-CM | POA: Diagnosis not present

## 2023-04-18 DIAGNOSIS — M2550 Pain in unspecified joint: Secondary | ICD-10-CM | POA: Diagnosis not present

## 2023-04-18 DIAGNOSIS — R5383 Other fatigue: Secondary | ICD-10-CM | POA: Diagnosis not present

## 2023-04-18 DIAGNOSIS — F411 Generalized anxiety disorder: Secondary | ICD-10-CM | POA: Diagnosis not present

## 2023-04-18 DIAGNOSIS — R519 Headache, unspecified: Secondary | ICD-10-CM | POA: Diagnosis not present

## 2023-04-19 DIAGNOSIS — E782 Mixed hyperlipidemia: Secondary | ICD-10-CM | POA: Diagnosis not present

## 2023-04-19 DIAGNOSIS — R791 Abnormal coagulation profile: Secondary | ICD-10-CM | POA: Diagnosis not present

## 2023-04-19 DIAGNOSIS — E559 Vitamin D deficiency, unspecified: Secondary | ICD-10-CM | POA: Diagnosis not present

## 2023-04-19 DIAGNOSIS — D519 Vitamin B12 deficiency anemia, unspecified: Secondary | ICD-10-CM | POA: Diagnosis not present

## 2023-04-26 ENCOUNTER — Emergency Department (HOSPITAL_BASED_OUTPATIENT_CLINIC_OR_DEPARTMENT_OTHER)
Admission: EM | Admit: 2023-04-26 | Discharge: 2023-04-26 | Disposition: A | Discharge status: Home / Self Care | Payer: BC Managed Care – PPO | Attending: Emergency Medicine | Admitting: Emergency Medicine

## 2023-04-26 ENCOUNTER — Emergency Department (HOSPITAL_BASED_OUTPATIENT_CLINIC_OR_DEPARTMENT_OTHER): Payer: BC Managed Care – PPO

## 2023-04-26 ENCOUNTER — Encounter (HOSPITAL_BASED_OUTPATIENT_CLINIC_OR_DEPARTMENT_OTHER): Payer: Self-pay

## 2023-04-26 ENCOUNTER — Other Ambulatory Visit: Payer: Self-pay

## 2023-04-26 DIAGNOSIS — R519 Headache, unspecified: Secondary | ICD-10-CM

## 2023-04-26 DIAGNOSIS — M542 Cervicalgia: Secondary | ICD-10-CM | POA: Diagnosis not present

## 2023-04-26 DIAGNOSIS — Z20822 Contact with and (suspected) exposure to covid-19: Secondary | ICD-10-CM | POA: Diagnosis not present

## 2023-04-26 DIAGNOSIS — R200 Anesthesia of skin: Secondary | ICD-10-CM | POA: Insufficient documentation

## 2023-04-26 DIAGNOSIS — R202 Paresthesia of skin: Secondary | ICD-10-CM | POA: Insufficient documentation

## 2023-04-26 DIAGNOSIS — R0600 Dyspnea, unspecified: Secondary | ICD-10-CM | POA: Diagnosis not present

## 2023-04-26 LAB — RESP PANEL BY RT-PCR (RSV, FLU A&B, COVID)  RVPGX2
Influenza A by PCR: NEGATIVE
Influenza B by PCR: NEGATIVE
Resp Syncytial Virus by PCR: NEGATIVE
SARS Coronavirus 2 by RT PCR: NEGATIVE

## 2023-04-26 LAB — CBC
HCT: 40.2 % (ref 36.0–46.0)
Hemoglobin: 13.8 g/dL (ref 12.0–15.0)
MCH: 29.9 pg (ref 26.0–34.0)
MCHC: 34.3 g/dL (ref 30.0–36.0)
MCV: 87.2 fL (ref 80.0–100.0)
Platelets: 228 10*3/uL (ref 150–400)
RBC: 4.61 MIL/uL (ref 3.87–5.11)
RDW: 12.3 % (ref 11.5–15.5)
WBC: 9.3 10*3/uL (ref 4.0–10.5)
nRBC: 0 % (ref 0.0–0.2)

## 2023-04-26 LAB — BASIC METABOLIC PANEL
Anion gap: 7 (ref 5–15)
BUN: 16 mg/dL (ref 6–20)
CO2: 26 mmol/L (ref 22–32)
Calcium: 8.8 mg/dL — ABNORMAL LOW (ref 8.9–10.3)
Chloride: 103 mmol/L (ref 98–111)
Creatinine, Ser: 0.87 mg/dL (ref 0.44–1.00)
GFR, Estimated: >60 mL/min (ref >60)
Glucose, Bld: 95 mg/dL (ref 70–99)
Potassium: 3.9 mmol/L (ref 3.5–5.1)
Sodium: 136 mmol/L (ref 135–145)

## 2023-04-26 LAB — PREGNANCY, URINE: Preg Test, Ur: NEGATIVE

## 2023-04-26 LAB — GROUP A STREP BY PCR: Group A Strep by PCR: NOT DETECTED

## 2023-04-26 MED ORDER — KETOROLAC TROMETHAMINE 15 MG/ML IJ SOLN
15.0000 mg | Freq: Once | INTRAMUSCULAR | Status: AC
  Administered 2023-04-26: 15 mg via INTRAVENOUS
  Filled 2023-04-26: qty 1

## 2023-04-26 MED ORDER — ACETAMINOPHEN 500 MG PO TABS
1000.0000 mg | ORAL_TABLET | Freq: Once | ORAL | Status: AC
  Administered 2023-04-26: 1000 mg via ORAL
  Filled 2023-04-26: qty 2

## 2023-04-26 MED ORDER — IOHEXOL 350 MG/ML SOLN
100.0000 mL | Freq: Once | INTRAVENOUS | Status: AC | PRN
  Administered 2023-04-26: 75 mL via INTRAVENOUS

## 2023-04-26 NOTE — ED Triage Notes (Signed)
Patient here POV from Home.  Endorses Neck Pain that has begun to radiate to Head, Back, and Right Eye. Began a few weeks ago. Visited PCP and was sent for Evaluation. Began to take Prednisone but began to have Pain to Throat Area.   No Known fevers. Symptoms are mostly consistent. Bilateral Hands and Feet are throbbing. No N/V/D.  NAD Noted during Triage. A&Ox4. GCS 15. Ambulatory.

## 2023-04-26 NOTE — ED Notes (Signed)
ED Provider at bedside. 

## 2023-04-26 NOTE — ED Notes (Signed)
RN reviewed discharge instructions with pt. Pt verbalized understanding and had no further questions. VSS upon discharge.  

## 2023-04-26 NOTE — ED Provider Notes (Signed)
[place] EMERGENCY DEPARTMENT AT [facility] Provider Note   CSN: [id number] Arrival date & time: 04/26/23  1414     History {Add pertinent medical, surgical, social history, OB history to HPI:1} Chief Complaint  Patient presents with   Neck Pain    DELLORA JOYCE is a 37 y.o. female.  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  Patient reports that for the past several weeks she has had right-sided neck pain.  Says that when she is stressed or yells she feels like something is going to pop in the right side of her neck.  No other exacerbating or alleviating factors.  Says that recently it started becoming behind her right eye.  No neck trauma or chiropractic appointments.  Says that she has numbness and tingling in her hands and feet bilaterally occasionally but denies any other neurologic deficits.  No slurred speech, no weakness, no numbness elsewhere.  Saw her primary doctor today who referred her into the emergency department for additional evaluation.  Says that she also has a sore throat after being placed on prednisone from Friday to Wednesday.  No fevers.       Home Medications Prior to Admission medications   Medication Sig Start Date End Date Taking? Authorizing Provider  AMBULATORY NON FORMULARY MEDICATION Nitroglycerine ointment 0.125 %  Apply a pea sized amount internally three to four times daily. Dispense 30 GM one refill 05/09/22   Napoleon Form, MD  dicyclomine (BENTYL) 10 MG capsule Take 1 capsule (10 mg total) by mouth 3 (three) times daily as needed (cramps). 01/07/23   Meredith Pel, NP  etonogestrel (NEXPLANON) 68 MG IMPL implant See admin instructions.    [provider]  fluticasone (FLONASE) 50 MCG/ACT nasal spray SPRAY 2 SPRAYS INTO EACH NOSTRIL EVERY DAY Patient not taking: Reported on 01/07/2023 04/18/18   Benjiman Core D, PA-C   methylPREDNISolone (MEDROL DOSEPAK) 4 MG TBPK tablet Take per package instructions Patient not taking: Reported on 01/07/2023 04/24/22   Roemhildt, Lorin T, PA-C  Multiple Vitamins-Minerals (THERA-M) TABS Take 1 tablet by mouth daily.     [provider]  ondansetron (ZOFRAN) 4 MG tablet Take 1 tablet (4 mg total) by mouth every 12 (twelve) hours as needed for nausea. 01/07/23   Meredith Pel, NP  pantoprazole (PROTONIX) 40 MG tablet Take 1 tablet (40 mg total) by mouth daily. 06/29/22   Napoleon Form, MD  propranolol ER (INDERAL LA) 60 MG 24 hr capsule TAKE 1 CAPSULE BY MOUTH EVERY DAY 04/16/22   Cantwell, Celeste C, PA-C  valACYclovir (VALTREX) 500 MG tablet Take 1 tablet by mouth as needed.  08/27/19   [provider]      Allergies    Macrobid [nitrofurantoin], Esomeprazole magnesium, Bacitracin, Neo-bacit-poly-lidocaine, Nickel, Onion, and Other    Review of Systems   Review of Systems  Physical Exam Updated Vital Signs BP (!) 120/92 (BP Location: Right Arm)   Pulse 88   Temp 99 F (37.2 C) (Oral)   Resp 18   Ht 5\' 5"  (1.651 m)   Wt 78 kg   SpO2 100%   BMI 28.62 kg/m  Physical Exam Vitals and nursing note reviewed.  Constitutional:      General: She is not in acute distress.    Appearance: She is well-developed.  HENT:     Head: Normocephalic and atraumatic.     Right Ear: External  ear normal.     Left Ear: External ear normal.     Nose: Nose normal.     Mouth/Throat:     Mouth: Mucous membranes are moist.     Pharynx: Oropharynx is clear. No oropharyngeal exudate or posterior oropharyngeal erythema.  Eyes:     Extraocular Movements: Extraocular movements intact.     Conjunctiva/sclera: Conjunctivae normal.     Pupils: Pupils are equal, round, and reactive to light.  Cardiovascular:     Rate and Rhythm: Normal rate and regular rhythm.     Heart sounds: No murmur heard. Pulmonary:     Effort: Pulmonary effort is normal. No respiratory  distress.     Breath sounds: Normal breath sounds.  Musculoskeletal:     Cervical back: Normal range of motion and neck supple.     Right lower leg: No edema.     Left lower leg: No edema.  Skin:    General: Skin is warm and dry.  Neurological:     Mental Status: She is alert and oriented to person, place, and time. Mental status is at baseline.     Comments: MENTAL STATUS: AAOx3 CRANIAL NERVES: II: Pupils equal and reactive 4 mm BL, no RAPD, no VF deficits III, IV, VI: EOM intact, no gaze preference or deviation, no nystagmus. V: normal sensation to light touch in V1, V2, and V3 segments bilaterally VII: no facial weakness or asymmetry, no nasolabial fold flattening VIII: normal hearing to speech and finger friction IX, X: normal palatal elevation, no uvular deviation XI: 5/5 head turn and 5/5 shoulder shrug bilaterally XII: midline tongue protrusion MOTOR: 5/5 strength in R shoulder flexion, elbow flexion and extension, and grip strength. 5/5 strength in L shoulder flexion, elbow flexion and extension, and grip strength.  5/5 strength in R hip and knee flexion, knee extension, ankle plantar and dorsiflexion. 5/5 strength in L hip and knee flexion, knee extension, ankle plantar and dorsiflexion. SENSORY: Normal sensation to light touch in all extremities COORD: Normal finger to nose and heel to shin, no tremor, no dysmetria STATION: normal stance, no truncal ataxia GAIT: Normal   Psychiatric:        Mood and Affect: Mood normal.     ED Results / Procedures / Treatments   Labs (all labs ordered are listed, but only abnormal results are displayed) Labs Reviewed - No data to display  EKG None  Radiology No results found.  Procedures Procedures  {Document cardiac monitor, telemetry assessment procedure when appropriate:1}  Medications Ordered in ED Medications - No data to display  ED Course/ Medical Decision Making/ A&P Clinical Course as of 04/27/23 1440  Fri Apr 26, 2023  1918 CT ANGIO HEAD NECK W WO CM Normal per radiology read [RP]    Clinical Course User Index [RP] Rondel Baton, MD   {   Click here for ABCD2, HEART and other calculatorsREFRESH Note before signing :1}                          Medical Decision Making Amount and/or Complexity of Data Reviewed Labs: ordered. Radiology: ordered. Decision-making details documented in ED Course.  Risk OTC drugs. Prescription drug management.   DORRAINE BEHNKEN is a 37 y.o. female with comorbidities that complicate the patient evaluation including history of provoked DVT/PE by surgery and esophagitis who presents to the emergency department with headache and right neck pain  Initial Ddx:  Migraine, dissection, trigeminal neuralgia,  temporal arteritis, URI, strep throat, esophagitis  MDM:  Feel the patient likely has a migraine she has a nonfocal neuroexam at this point in time.  Low concern for dissection or aneurysm based on her symptoms but does feel like something is going to pop and her primary doctor sent her over for a CT scan so we will obtain this time to make sure she does not have a dissection but I feel is highly unlikely.  Symptoms not consistent with trigeminal neuralgia.  Not complaining of any polymyalgia rheumatica, jaw claudication, or other symptoms that would be concerning for temporal arteritis.  No other infectious symptoms we will go ahead and test her for your eyes and strep throat with her throat pain.  He is negative feel it could be likely esophagitis.  Plan:  Labs COVID/flu CT angio head and neck  ED Summary/Re-evaluation:  ***  This patient presents to the ED for concern of complaints listed in HPI, this involves an extensive number of treatment options, and is a complaint that carries with it a high risk of complications and morbidity. Disposition including potential need for admission considered.   Dispo: {Disposition:28069}  Additional history obtained  from {Additional History:28067} Records reviewed {Records Reviewed:28068} The following labs were independently interpreted: {labs interpreted:28064} and show {lab findings:28250} I independently reviewed the following imaging with scope of interpretation limited to determining acute life threatening conditions related to emergency care: {imaging interpreted:28065} and agree with the radiologist interpretation with the following exceptions: none I personally reviewed and interpreted cardiac monitoring: {cardiac monitoring:28251} I personally reviewed and interpreted the pt's EKG: see above for interpretation  I have reviewed the patients home medications and made adjustments as needed Consults: {Consultants:28063} Social Determinants of health:  ***   {Document critical care time when appropriate:1} {Document review of labs and clinical decision tools ie heart score, Chads2Vasc2 etc:1}  {Document your independent review of radiology images, and any outside records:1} {Document your discussion with family members, caretakers, and with consultants:1} {Document social determinants of health affecting pt's care:1} {Document your decision making why or why not admission, treatments were needed:1} Final Clinical Impression(s) / ED Diagnoses Final diagnoses:  None    Rx / DC Orders ED Discharge Orders     None

## 2023-04-26 NOTE — Discharge Instructions (Signed)
You were seen for your headache in the emergency department.  You had a CAT scan and lab work that was reassuring.  At home, please take Tylenol and ibuprofen for your headache.    Check your MyChart online for the results of any tests that had not resulted by the time you left the emergency department.   Follow-up with your primary doctor in 2-3 days regarding your visit.  Follow-up with neurology regarding your headache and other symptoms as well.  Return immediately to the emergency department if you experience any of the following: Unbearable headache, vision changes, facial droop, weakness of your arms or legs, or any other concerning symptoms.    Thank you for visiting our Emergency Department. It was a pleasure taking care of you today.

## 2023-05-06 ENCOUNTER — Ambulatory Visit: Payer: BC Managed Care – PPO | Admitting: Cardiology

## 2023-05-06 DIAGNOSIS — M545 Low back pain, unspecified: Secondary | ICD-10-CM | POA: Diagnosis not present

## 2023-05-09 DIAGNOSIS — Z01419 Encounter for gynecological examination (general) (routine) without abnormal findings: Secondary | ICD-10-CM | POA: Diagnosis not present

## 2023-05-09 DIAGNOSIS — Z124 Encounter for screening for malignant neoplasm of cervix: Secondary | ICD-10-CM | POA: Diagnosis not present

## 2023-05-09 DIAGNOSIS — Z6829 Body mass index (BMI) 29.0-29.9, adult: Secondary | ICD-10-CM | POA: Diagnosis not present

## 2023-05-09 DIAGNOSIS — Z309 Encounter for contraceptive management, unspecified: Secondary | ICD-10-CM | POA: Diagnosis not present

## 2023-05-09 DIAGNOSIS — Z1151 Encounter for screening for human papillomavirus (HPV): Secondary | ICD-10-CM | POA: Diagnosis not present

## 2023-05-09 DIAGNOSIS — M25559 Pain in unspecified hip: Secondary | ICD-10-CM | POA: Diagnosis not present

## 2023-05-09 DIAGNOSIS — R102 Pelvic and perineal pain: Secondary | ICD-10-CM | POA: Diagnosis not present

## 2023-05-14 ENCOUNTER — Encounter: Payer: Self-pay | Admitting: Cardiology

## 2023-05-14 ENCOUNTER — Ambulatory Visit: Payer: BC Managed Care – PPO | Admitting: Cardiology

## 2023-05-14 VITALS — BP 120/77 | HR 91 | Ht 65.0 in | Wt 180.0 lb

## 2023-05-14 DIAGNOSIS — R002 Palpitations: Secondary | ICD-10-CM | POA: Diagnosis not present

## 2023-05-14 DIAGNOSIS — R000 Tachycardia, unspecified: Secondary | ICD-10-CM | POA: Diagnosis not present

## 2023-05-14 MED ORDER — PROPRANOLOL HCL ER 60 MG PO CP24
60.0000 mg | ORAL_CAPSULE | Freq: Every day | ORAL | 1 refills | Status: DC
Start: 2023-05-14 — End: 2023-11-25

## 2023-05-14 NOTE — Progress Notes (Signed)
Primary Physician:  Lewis Moccasin, MD  Patient ID: Stacy Wilkins, female    DOB: Mar 27, 1986, 37 y.o.   MRN: [id number]  Date: 05/14/23 Last Office Visit: February 07, 2022  Subjective:    Chief Complaint  Patient presents with   Follow-up    1 year follow-up, history of palpitations, tachycardia    HPI: Stacy Wilkins  is a 37 y.o. female whose past medical history and cardiovascular risk factors include but not limited to: left leg DVT and bilateral PE in April 2018 after foot surgery/on OCP.  Patient was last seen in the office by Elvin So, PA and I am seeing her for the first time.  Patient is being followed by the practice for palpitations.  She is undergone coronary calcium score and Zio patch in the past.  Was placed on propranolol for symptom management.  She presents today for more than 1 year follow-up and reevaluation of tachycardia.  Patient states that she is doing well on the current dose of propranolol that was initiated last year.  However, when she forgets to take the medication she definitely notices a change in her overall wellbeing/symptoms.  She is currently having back pain and as result of that has been experiencing elevated heart rates but no lightheaded, dizziness, near-syncope or syncopal events.  She denies anginal chest pain or heart failure symptoms.   Past Medical History:  Diagnosis Date   Anxiety    Depression    DVT (deep venous thrombosis) (HCC)    Generalized headaches    GERD (gastroesophageal reflux disease)    Hyperlipidemia    Migraine    PE (pulmonary thromboembolism) (HCC)    Ulcer    Family History  Problem Relation Age of Onset   Hypertension Mother    Hypertension Father    Prostate cancer Father    Prostate cancer Brother    Diabetes Maternal Uncle    Colon cancer Neg Hx    Liver cancer Neg Hx    Past Surgical History:  Procedure Laterality Date   bunionectomy      EYE SURGERY     TONSILLECTOMY AND  ADENOIDECTOMY     Social History   Tobacco Use   Smoking status: Former    Packs/day: 0.25    Years: 3.00    Additional pack years: 0.00    Total pack years: 0.75    Types: Cigarettes    Quit date: 2014    Years since quitting: 10.4   Smokeless tobacco: Never   Tobacco comments:    used to smoke socially  Substance Use Topics   Alcohol use: Yes    Comment: rare   Marital Status: Single  Review of Systems  Constitutional: Negative for malaise/fatigue and weight gain.  Cardiovascular:  Positive for palpitations (stable - chronic). Negative for chest pain, claudication, dyspnea on exertion, leg swelling, near-syncope, orthopnea, paroxysmal nocturnal dyspnea and syncope.  Hematologic/Lymphatic: Does not bruise/bleed easily.  Musculoskeletal:  Positive for back pain.  Gastrointestinal:  Negative for melena.  Neurological:  Negative for dizziness and weakness.   Objective:  Blood pressure 120/77, pulse 91, height 5\' 5"  (1.651 m), weight 180 lb (81.6 kg), SpO2 97 %. Body mass index is 29.95 kg/m.    Physical Exam Vitals reviewed.  Constitutional:      Appearance: She is well-developed.  Cardiovascular:     Rate and Rhythm: Normal rate and regular rhythm.     Pulses: Intact distal pulses.  Carotid pulses are 2+ on the right side and 2+ on the left side.      Radial pulses are 2+ on the right side and 2+ on the left side.       Femoral pulses are 2+ on the right side and 2+ on the left side.      Popliteal pulses are 2+ on the right side and 2+ on the left side.       Dorsalis pedis pulses are 2+ on the right side and 2+ on the left side.       Posterior tibial pulses are 2+ on the right side and 2+ on the left side.     Heart sounds: Normal heart sounds, S1 normal and S2 normal. No murmur heard.    No gallop.  Pulmonary:     Effort: Pulmonary effort is normal. No accessory muscle usage or respiratory distress.     Breath sounds: Normal breath sounds. No wheezing,  rhonchi or rales.  Musculoskeletal:        General: Normal range of motion.     Cervical back: Normal range of motion.     Right lower leg: No edema.     Left lower leg: No edema.    Laboratory examination:      Latest Ref Rng & Units 04/26/2023    3:47 PM 02/08/2023   10:18 PM 04/24/2022    9:28 AM  CMP  Glucose 70 - 99 mg/dL 95  147  95   BUN 6 - 20 mg/dL 16  9  10    Creatinine 0.44 - 1.00 mg/dL 8.29  5.62  1.30   Sodium 135 - 145 mmol/L 136  137  137   Potassium 3.5 - 5.1 mmol/L 3.9  3.5  3.8   Chloride 98 - 111 mmol/L 103  105  105   CO2 22 - 32 mmol/L 26  25  21    Calcium 8.9 - 10.3 mg/dL 8.8  9.3  8.6   Total Protein 6.5 - 8.1 g/dL   6.0   Total Bilirubin 0.3 - 1.2 mg/dL   0.4   Alkaline Phos 38 - 126 U/L   40   AST 15 - 41 U/L   18   ALT 0 - 44 U/L   19       Latest Ref Rng & Units 04/26/2023    3:47 PM 02/08/2023   10:18 PM 01/07/2023    2:20 PM  CBC  WBC 4.0 - 10.5 K/uL 9.3  8.4  6.7   Hemoglobin 12.0 - 15.0 g/dL 86.5  78.4  69.6   Hematocrit 36.0 - 46.0 % 40.2  39.5  41.7   Platelets 150 - 400 K/uL 228  210  203.0    Lipid Panel     Component Value Date/Time   CHOL 155 09/27/2017 0919   TRIG 47 09/27/2017 0919   HDL 40 09/27/2017 0919   CHOLHDL 3.9 09/27/2017 0919   CHOLHDL 4 09/29/2015 0936   VLDL 15.2 09/29/2015 0936   LDLCALC 106 (H) 09/27/2017 0919   HEMOGLOBIN A1C Lab Results  Component Value Date   HGBA1C 4.9 02/06/2016   TSH Recent Labs    01/07/23 1420  TSH 0.87   Allergies   Allergies  Allergen Reactions   Ciprofloxacin Anaphylaxis   Nitrofurantoin Palpitations and Nausea And Vomiting    Chest pain   Esomeprazole Magnesium Nausea And Vomiting   Bacitracin     Other reaction(s): Unknown   Mustard Nausea  And Vomiting and Other (See Comments)   Neo-Bacit-Poly-Lidocaine     Other reaction(s): Unknown   Nickel Other (See Comments)   Onion Nausea And Vomiting   Other Nausea And Vomiting    Green peppers Other reaction(s): Unknown     Medications Prior to Visit:   Outpatient Medications Prior to Visit  Medication Sig Dispense Refill   AMBULATORY NON FORMULARY MEDICATION Nitroglycerine ointment 0.125 %  Apply a pea sized amount internally three to four times daily. Dispense 30 GM one refill 30 g 1   dicyclomine (BENTYL) 10 MG capsule Take 1 capsule (10 mg total) by mouth 3 (three) times daily as needed (cramps). 30 capsule 2   etonogestrel (NEXPLANON) 68 MG IMPL implant See admin instructions.     fluticasone (FLONASE) 50 MCG/ACT nasal spray SPRAY 2 SPRAYS INTO EACH NOSTRIL EVERY DAY 16 g 1   Multiple Vitamins-Minerals (THERA-M) TABS Take 1 tablet by mouth daily.      ondansetron (ZOFRAN) 4 MG tablet Take 1 tablet (4 mg total) by mouth every 12 (twelve) hours as needed for nausea. 20 tablet 0   valACYclovir (VALTREX) 500 MG tablet Take 1 tablet by mouth as needed.      propranolol ER (INDERAL LA) 60 MG 24 hr capsule TAKE 1 CAPSULE BY MOUTH EVERY DAY 90 capsule 1   methylPREDNISolone (MEDROL DOSEPAK) 4 MG TBPK tablet Take per package instructions (Patient not taking: Reported on 01/07/2023) 1 each 0   pantoprazole (PROTONIX) 40 MG tablet Take 1 tablet (40 mg total) by mouth daily. 30 tablet 1   No facility-administered medications prior to visit.   Final Medications at End of Visit    Current Meds  Medication Sig   AMBULATORY NON FORMULARY MEDICATION Nitroglycerine ointment 0.125 %  Apply a pea sized amount internally three to four times daily. Dispense 30 GM one refill   dicyclomine (BENTYL) 10 MG capsule Take 1 capsule (10 mg total) by mouth 3 (three) times daily as needed (cramps).   etonogestrel (NEXPLANON) 68 MG IMPL implant See admin instructions.   fluticasone (FLONASE) 50 MCG/ACT nasal spray SPRAY 2 SPRAYS INTO EACH NOSTRIL EVERY DAY   Multiple Vitamins-Minerals (THERA-M) TABS Take 1 tablet by mouth daily.    ondansetron (ZOFRAN) 4 MG tablet Take 1 tablet (4 mg total) by mouth every 12 (twelve) hours as  needed for nausea.   valACYclovir (VALTREX) 500 MG tablet Take 1 tablet by mouth as needed.    [DISCONTINUED] propranolol ER (INDERAL LA) 60 MG 24 hr capsule TAKE 1 CAPSULE BY MOUTH EVERY DAY   Cardiac Studies:  Coronary calcium score 05/04/21: Coronary calcium score of 0.  Echocardiogram 04/12/2021:  Normal LV systolic function with visual EF 60-65%. Left ventricle cavity  is normal in size. Normal global wall motion. Normal diastolic filling  pattern, normal LAP.  No significant valvular abnormalities.  Compared to study dated 10/06/2018: no significant change.    Event Monitor for 7 days Start date 09/16/2019: [name] monitoring period was 09/16/2019 - 09/22/2019. Baseline sample showed Sinus Rhythm with a heart rate of 78.6 bpm. There were 0 critical, 0 serious, and 3 stable events that occurred. The report analysis of the critical, serious, stable and manually triggered events reveal NSR. There were rare PACs and PVCs.   EKG  May 14, 2023: Sinus rhythm, 82 bpm, without underlying ischemia or injury pattern.  Assessment:     ICD-10-CM   1. Palpitations  R00.2 EKG 12-Lead    propranolol ER (INDERAL  LA) 60 MG 24 hr capsule    2. Tachycardia  R00.0 EKG 12-Lead    propranolol ER (INDERAL LA) 60 MG 24 hr capsule     Meds ordered this encounter  Medications   propranolol ER (INDERAL LA) 60 MG 24 hr capsule    Sig: Take 1 capsule (60 mg total) by mouth daily.    Dispense:  90 capsule    Refill:  1   Medications Discontinued During This Encounter  Medication Reason   methylPREDNISolone (MEDROL DOSEPAK) 4 MG TBPK tablet Completed Course   pantoprazole (PROTONIX) 40 MG tablet Completed Course   propranolol ER (INDERAL LA) 60 MG 24 hr capsule Reorder     Recommendations:  Stacy Wilkins  is a 37 y.o.female whose past medical history and cardiovascular risk factors include but not limited to: left leg DVT and bilateral PE in April 2018 after foot surgery/on  OCP.  Patient presents today for 1 year follow-up visit given her history of palpitations and currently on propranolol.  She has noticed a significant improvement in symptoms since initiation of propranolol.  Medication refilled.  In the recent past she has noted increased episodes of palpitations/tachycardia in the setting of back pain with radicular discomfort to bilateral legs.  Patient states that she has seen multiple providers for the discomfort and plans to see neuro spine in the coming weeks/months.    I suspect that the recent increase in heart rate is likely secondary to underlying pain/discomfort.  She denies any anginal chest pain or heart failure symptoms.  No prolonged immobilization, recent surgery, tachypnea.  Would not uptitrate medical therapy as there is a reason why she is ranging tachycardia.  However, if symptoms continue despite correcting the underlying cause may need to reconsider repeat Zio patch.  Tessa Lerner, [place], [facility]  Pager:  [phone] Office: [phone]

## 2023-05-15 DIAGNOSIS — M461 Sacroiliitis, not elsewhere classified: Secondary | ICD-10-CM | POA: Diagnosis not present

## 2023-05-15 DIAGNOSIS — M47816 Spondylosis without myelopathy or radiculopathy, lumbar region: Secondary | ICD-10-CM | POA: Diagnosis not present

## 2023-05-16 DIAGNOSIS — R109 Unspecified abdominal pain: Secondary | ICD-10-CM

## 2023-05-16 DIAGNOSIS — R110 Nausea: Secondary | ICD-10-CM

## 2023-05-18 NOTE — Telephone Encounter (Signed)
Please advise patient to add daily Miralax or stool softener (Docusate) to improve bowel habits. Continue gluten free diet. Check TTG Iga Ab to check for accidental exposure.  Schedule follow up visit with me or APP next available. Thanks

## 2023-05-20 NOTE — Addendum Note (Signed)
Addended by: Richardson Chiquito on: 05/20/2023 09:41 AM   Modules accepted: Orders

## 2023-05-21 DIAGNOSIS — R102 Pelvic and perineal pain: Secondary | ICD-10-CM | POA: Diagnosis not present

## 2023-05-21 DIAGNOSIS — Z91018 Allergy to other foods: Secondary | ICD-10-CM | POA: Diagnosis not present

## 2023-05-21 DIAGNOSIS — K5909 Other constipation: Secondary | ICD-10-CM | POA: Diagnosis not present

## 2023-05-23 DIAGNOSIS — K900 Celiac disease: Secondary | ICD-10-CM | POA: Diagnosis not present

## 2023-05-23 DIAGNOSIS — K5900 Constipation, unspecified: Secondary | ICD-10-CM | POA: Diagnosis not present

## 2023-05-27 DIAGNOSIS — K900 Celiac disease: Secondary | ICD-10-CM | POA: Diagnosis not present

## 2023-05-28 DIAGNOSIS — Z3046 Encounter for surveillance of implantable subdermal contraceptive: Secondary | ICD-10-CM | POA: Diagnosis not present

## 2023-05-28 DIAGNOSIS — R1031 Right lower quadrant pain: Secondary | ICD-10-CM | POA: Diagnosis not present

## 2023-06-04 DIAGNOSIS — M47816 Spondylosis without myelopathy or radiculopathy, lumbar region: Secondary | ICD-10-CM | POA: Diagnosis not present

## 2023-07-05 ENCOUNTER — Ambulatory Visit: Payer: BC Managed Care – PPO | Admitting: Physician Assistant

## 2023-07-05 ENCOUNTER — Encounter: Payer: Self-pay | Admitting: Physician Assistant

## 2023-07-05 VITALS — BP 120/80 | HR 66 | Ht 65.0 in | Wt 172.0 lb

## 2023-07-05 DIAGNOSIS — K6289 Other specified diseases of anus and rectum: Secondary | ICD-10-CM

## 2023-07-05 DIAGNOSIS — K625 Hemorrhage of anus and rectum: Secondary | ICD-10-CM | POA: Diagnosis not present

## 2023-07-05 DIAGNOSIS — K602 Anal fissure, unspecified: Secondary | ICD-10-CM | POA: Diagnosis not present

## 2023-07-05 MED ORDER — AMBULATORY NON FORMULARY MEDICATION: 1 refills | Status: AC

## 2023-07-05 NOTE — Patient Instructions (Addendum)
We have sent a prescription for Diltiazem 2% gel to [facility] for you. Using your index finger, you should apply a small amount of medication inside the rectum up to your first knuckle/joint three times daily x 6-8 weeks.  [facility]'s information is below: Address: [address], [place], [place] [address]  [phone]  *Please DO NOT go directly from our office to pick up this medication! Give the pharmacy 1 day to process the prescription as this is compounded and takes time to make.  May use OTC Reticare with Lidocaine as needed.  Sitz Bath as needed. _______________________________________________________  If your blood pressure at your visit was 140/90 or greater, please contact your primary care physician to follow up on this.  _______________________________________________________  If you are age 43 or older, your body mass index should be between 23-30. Your Body mass index is 28.62 kg/m. If this is out of the aforementioned range listed, please consider follow up with your Primary Care Provider.  If you are age 87 or younger, your body mass index should be between 19-25. Your Body mass index is 28.62 kg/m. If this is out of the aformentioned range listed, please consider follow up with your Primary Care Provider.   ________________________________________________________  [facility] providers would like to encourage you to use [facility] to communicate with providers for non-urgent requests or questions.  Due to long hold times on the telephone, sending your provider a message by [facility] may be a faster and more efficient way to get a response.  Please allow 48 business hours for a response.  Please remember that this is for non-urgent requests.  _______________________________________________________

## 2023-07-05 NOTE — Progress Notes (Signed)
Chief Complaint: Rectal bleeding and pain  HPI:    Stacy Wilkins is a  37 y/o female with a past medical history as listed below including anxiety, depression, DVT, PE and GERD, known to Dr. Lavon Paganini, who was referred to me by Lewis Moccasin, MD for a complaint of rectal bleeding and pain.    09/20/2020 colonoscopy with mild diverticulosis in the sigmoid, descending and transverse colon as well as nonbleeding internal hemorrhoids.  Repeat recommended 10 years.    01/07/2023 patient seen by Willette Cluster and at that time had generalized abdominal pain, nausea, bowel changes and mild weight loss.  Stools were sent for leukocytes, labs checked including CBC and TSH.  Dicyclomine given 10 mg 3 times daily as needed.  Also Zofran.  Fecal lactoferrin negative and TSH normal.    05/16/2023 patient contacted our clinic and describes severe pain in her abdomen around the back.  Increased with a bowel movement.  Also nausea.  At that time describes stabbing pain.  Also never felt emptied.  Described that Dicyclomine was not helping Zofran is helping with nausea.  At that time told to add daily MiraLAX or stool softener to improve bowel habits.  Continue gluten-free diet.  Check celiac studies.    05/22/2023 patient describes still having a lot of pain when she uses the bathroom.  She had cut out gluten.    Today, patient's biggest complaint is of rectal bleeding and pain.  She tells me that she has always had occasional bright red blood off-and-on with a bowel movement, but over the past week this has been every day and sometimes she is even waking up with bright red blood and a clot in her underwear.  Tells me that her bowel movements seem to vary sometimes are very normal and other times she will not go at all and occasionally she uses MiraLAX.  Apparently has had a fissure before about a year ago.  She has been using RectiCare cream which helps with the discomfort some.    Denies fever, chills or weight  loss.  Past Medical History:  Diagnosis Date   Anxiety    Depression    DVT (deep venous thrombosis) (HCC)    Generalized headaches    GERD (gastroesophageal reflux disease)    Hyperlipidemia    Migraine    PE (pulmonary thromboembolism) (HCC)    Ulcer     Past Surgical History:  Procedure Laterality Date   bunionectomy      EYE SURGERY     TONSILLECTOMY AND ADENOIDECTOMY      Current Outpatient Medications  Medication Sig Dispense Refill   AMBULATORY NON FORMULARY MEDICATION Nitroglycerine ointment 0.125 %  Apply a pea sized amount internally three to four times daily. Dispense 30 GM one refill 30 g 1   dicyclomine (BENTYL) 10 MG capsule Take 1 capsule (10 mg total) by mouth 3 (three) times daily as needed (cramps). 30 capsule 2   etonogestrel (NEXPLANON) 68 MG IMPL implant See admin instructions.     fluticasone (FLONASE) 50 MCG/ACT nasal spray SPRAY 2 SPRAYS INTO EACH NOSTRIL EVERY DAY 16 g 1   Multiple Vitamins-Minerals (THERA-M) TABS Take 1 tablet by mouth daily.      ondansetron (ZOFRAN) 4 MG tablet Take 1 tablet (4 mg total) by mouth every 12 (twelve) hours as needed for nausea. 20 tablet 0   propranolol ER (INDERAL LA) 60 MG 24 hr capsule Take 1 capsule (60 mg total) by mouth daily. 90  capsule 1   valACYclovir (VALTREX) 500 MG tablet Take 1 tablet by mouth as needed.      No current facility-administered medications for this visit.    Allergies as of 07/05/2023 - Review Complete 05/14/2023  Allergen Reaction Noted   Ciprofloxacin Anaphylaxis 05/14/2023   Nitrofurantoin Palpitations and Nausea And Vomiting 09/20/2020   Esomeprazole magnesium Nausea And Vomiting 09/29/2015   Bacitracin  07/04/2020   Mustard Nausea And Vomiting and Other (See Comments) 05/27/2017   Neo-bacit-poly-lidocaine  07/04/2020   Nickel Other (See Comments) 07/04/2020   Onion Nausea And Vomiting 05/27/2017   Other Nausea And Vomiting 05/27/2017    Family History  Problem Relation Age of  Onset   Hypertension Mother    Hypertension Father    Prostate cancer Father    Prostate cancer Brother    Diabetes Maternal Uncle    Colon cancer Neg Hx    Liver cancer Neg Hx     Social History   Socioeconomic History   Marital status: Single    Spouse name: Not on file   Number of children: 0   Years of education: Not on file   Highest education level: Not on file  Occupational History   Occupation: Billing/Insurance    Comment: Nutritional therapist  Tobacco Use   Smoking status: Former    Current packs/day: 0.00    Average packs/day: 0.3 packs/day for 3.0 years (0.8 ttl pk-yrs)    Types: Cigarettes    Start date: 2011    Quit date: 2014    Years since quitting: 10.6   Smokeless tobacco: Never   Tobacco comments:    used to smoke socially  Vaping Use   Vaping status: Never Used  Substance and Sexual Activity   Alcohol use: Yes    Comment: rare   Drug use: No   Sexual activity: Not Currently    Birth control/protection: Implant  Other Topics Concern   Not on file  Social History Narrative   Not on file   Social Determinants of Health   Financial Resource Strain: Low Risk  (12/01/2019)   [facility] from [facility] prior to 01/26/2023., [facility] visits prior to 01/26/2023.   Overall Financial Resource Strain (CARDIA)    Difficulty of Paying Living Expenses: Not very hard  Food Insecurity: No Food Insecurity (12/01/2019)   Received from [facility] visits prior to 01/26/2023., [facility] visits prior to 01/26/2023.   Hunger Vital Sign    Worried About Running Out of Food in the Last Year: Never true    Ran Out of Food in the Last Year: Never true  Transportation Needs: No Transportation Needs (12/01/2019)   Received from [facility] visits prior to 01/26/2023., [facility] visits prior to 01/26/2023.   PRAPARE - Therapist, art (Medical): No    Lack of Transportation (Non-Medical): No  Physical Activity: Insufficiently Active (12/01/2019)   Received from [facility] visits prior to 01/26/2023., [facility] visits prior to 01/26/2023.   Exercise Vital Sign    Days of Exercise per Week: 1 day    Minutes of Exercise per Session: 20 min  Stress: Stress Concern Present (12/01/2019)   [facility] from [facility] visits prior to 01/26/2023., [facility] visits prior to 01/26/2023.   [employer] of Occupational  Health - Occupational Stress Questionnaire    Feeling of Stress : Rather much  Social Connections: Unknown (03/26/2022)   Received from [facility], [facility]   Social Network    Social Network: Not on file  Intimate Partner Violence: Unknown (02/28/2022)   Received from [facility], [facility]   HITS    Physically Hurt: Not on file    Insult or Talk Down To: Not on file    Threaten Physical Harm: Not on file    Scream or Curse: Not on file    Review of Systems:    Constitutional: No weight loss, fever or chills Cardiovascular: No chest pain Respiratory: No SOB  Gastrointestinal: See HPI and otherwise negative   Physical Exam:  Vital signs: BP 120/80   Pulse 66   Ht 5\' 5"  (1.651 m)   Wt 172 lb (78 kg)   BMI 28.62 kg/m   Constitutional:   Pleasant Caucasian female appears to be in NAD, Well developed, Well nourished, alert and cooperative Respiratory: Respirations even and unlabored. Lungs clear to auscultation bilaterally.   No wheezes, crackles, or rhonchi.  Cardiovascular: Normal S1, S2. No MRG. Regular rate and rhythm. No peripheral edema, cyanosis or pallor.  Gastrointestinal:  Soft, nondistended, nontender. No rebound or guarding. Normal bowel sounds. No appreciable masses or hepatomegaly. Rectal: External: No hemorrhoids, posterior fissure; internal: Tenderness to palpation  posteriorly; anoscopy: Fissure, no internal hemorrhoids Psychiatric: Oriented to person, place and time. Demonstrates good judgement and reason without abnormal affect or behaviors.  RELEVANT LABS AND IMAGING: CBC    Component Value Date/Time   WBC 9.3 04/26/2023 1547   RBC 4.61 04/26/2023 1547   HGB 13.8 04/26/2023 1547   HGB 13.7 09/27/2017 0919   HCT 40.2 04/26/2023 1547   HCT 40.6 09/27/2017 0919   PLT 228 04/26/2023 1547   PLT 175 09/27/2017 0919   MCV 87.2 04/26/2023 1547   MCV 87 09/27/2017 0919   MCH 29.9 04/26/2023 1547   MCHC 34.3 04/26/2023 1547   RDW 12.3 04/26/2023 1547   RDW 12.4 09/27/2017 0919   LYMPHSABS 2.6 02/08/2023 2218   LYMPHSABS 1.6 09/27/2017 0919   MONOABS 0.6 02/08/2023 2218   EOSABS 0.2 02/08/2023 2218   EOSABS 0.2 09/27/2017 0919   BASOSABS 0.0 02/08/2023 2218   BASOSABS 0.0 09/27/2017 0919    CMP     Component Value Date/Time   NA 136 04/26/2023 1547   NA 138 09/27/2017 0919   K 3.9 04/26/2023 1547   CL 103 04/26/2023 1547   CO2 26 04/26/2023 1547   GLUCOSE 95 04/26/2023 1547   BUN 16 04/26/2023 1547   BUN 13 09/27/2017 0919   CREATININE 0.87 04/26/2023 1547   CREATININE 0.60 02/06/2016 1410   CALCIUM 8.8 (L) 04/26/2023 1547   PROT 6.0 (L) 04/24/2022 0928   PROT 6.7 09/27/2017 0919   ALBUMIN 3.8 04/24/2022 0928   ALBUMIN 4.5 09/27/2017 0919   AST 18 04/24/2022 0928   ALT 19 04/24/2022 0928   ALKPHOS 40 04/24/2022 0928   BILITOT 0.4 04/24/2022 0928   BILITOT 0.6 09/27/2017 0919   GFRNONAA >60 04/26/2023 1547   GFRAA >60 12/21/2018 0344    Assessment: 1.  Rectal pain and bleeding: Fissure on exam today posteriorly, this is somewhat superficial, but tender to palpation and likely the cause of all her bleeding, up-to-date on colonoscopy  Plan: 1.  Prescribed Diltiazem ointment 3 times daily x 6-8 weeks. 2.  Recommend sitz bath's 2-3 times a day  3.  Continue RectiCare with lidocaine 4.  Recommend patient start MiraLAX daily to  help her keep some normalcy to her stools and avoid constipation. 5.  Patient to follow in clinic in 2 to 3 months or sooner if necessary.  Hyacinth Meeker, PA-C [facility] 07/05/2023, 2:04 PM  Cc: Lewis Moccasin, MD

## 2023-07-26 DIAGNOSIS — H6690 Otitis media, unspecified, unspecified ear: Secondary | ICD-10-CM | POA: Diagnosis not present

## 2023-08-01 DIAGNOSIS — M436 Torticollis: Secondary | ICD-10-CM | POA: Diagnosis not present

## 2023-08-02 ENCOUNTER — Telehealth: Payer: Self-pay

## 2023-08-02 DIAGNOSIS — R079 Chest pain, unspecified: Secondary | ICD-10-CM | POA: Diagnosis not present

## 2023-08-02 DIAGNOSIS — M542 Cervicalgia: Secondary | ICD-10-CM | POA: Diagnosis not present

## 2023-08-02 DIAGNOSIS — R002 Palpitations: Secondary | ICD-10-CM | POA: Diagnosis not present

## 2023-08-02 DIAGNOSIS — S060X0A Concussion without loss of consciousness, initial encounter: Secondary | ICD-10-CM | POA: Diagnosis not present

## 2023-08-02 DIAGNOSIS — F419 Anxiety disorder, unspecified: Secondary | ICD-10-CM | POA: Diagnosis not present

## 2023-08-02 NOTE — Telephone Encounter (Signed)
Patient states she has been having chest pains and palpitations for the past couple hours. Front desk said they are booking out to October for you so they didn't have anything so I advised patient to go to urgent care.

## 2023-08-02 NOTE — Telephone Encounter (Signed)
Please follow-up and see how she is doing.  Stacy Thien [place], DO, [facility]

## 2023-08-05 NOTE — Telephone Encounter (Signed)
Called patient no answer and could not LVM

## 2023-08-09 NOTE — Telephone Encounter (Signed)
2nd attempt : Called patient, NA, no VM is full and could not leave a message.

## 2023-08-13 DIAGNOSIS — N7689 Other specified inflammation of vagina and vulva: Secondary | ICD-10-CM | POA: Diagnosis not present

## 2023-08-13 DIAGNOSIS — L293 Anogenital pruritus, unspecified: Secondary | ICD-10-CM | POA: Diagnosis not present

## 2023-08-13 DIAGNOSIS — R300 Dysuria: Secondary | ICD-10-CM | POA: Diagnosis not present

## 2023-08-13 DIAGNOSIS — L292 Pruritus vulvae: Secondary | ICD-10-CM | POA: Diagnosis not present

## 2023-08-13 DIAGNOSIS — Z113 Encounter for screening for infections with a predominantly sexual mode of transmission: Secondary | ICD-10-CM | POA: Diagnosis not present

## 2023-09-06 ENCOUNTER — Ambulatory Visit: Payer: BC Managed Care – PPO

## 2023-09-06 DIAGNOSIS — Z23 Encounter for immunization: Secondary | ICD-10-CM | POA: Diagnosis not present

## 2023-09-17 ENCOUNTER — Ambulatory Visit: Payer: BC Managed Care – PPO | Admitting: Physician Assistant

## 2023-09-24 DIAGNOSIS — Z3202 Encounter for pregnancy test, result negative: Secondary | ICD-10-CM | POA: Diagnosis not present

## 2023-09-24 DIAGNOSIS — Z3046 Encounter for surveillance of implantable subdermal contraceptive: Secondary | ICD-10-CM | POA: Diagnosis not present

## 2023-10-03 DIAGNOSIS — L738 Other specified follicular disorders: Secondary | ICD-10-CM | POA: Diagnosis not present

## 2023-10-03 DIAGNOSIS — D225 Melanocytic nevi of trunk: Secondary | ICD-10-CM | POA: Diagnosis not present

## 2023-10-10 ENCOUNTER — Ambulatory Visit: Payer: BC Managed Care – PPO | Admitting: Podiatry

## 2023-10-10 DIAGNOSIS — F4323 Adjustment disorder with mixed anxiety and depressed mood: Secondary | ICD-10-CM | POA: Diagnosis not present

## 2023-10-17 ENCOUNTER — Ambulatory Visit: Payer: BC Managed Care – PPO | Admitting: Podiatry

## 2023-10-22 DIAGNOSIS — Z13220 Encounter for screening for lipoid disorders: Secondary | ICD-10-CM | POA: Diagnosis not present

## 2023-10-22 DIAGNOSIS — Z114 Encounter for screening for human immunodeficiency virus [HIV]: Secondary | ICD-10-CM | POA: Diagnosis not present

## 2023-10-22 DIAGNOSIS — Z0000 Encounter for general adult medical examination without abnormal findings: Secondary | ICD-10-CM | POA: Diagnosis not present

## 2023-10-22 DIAGNOSIS — F4323 Adjustment disorder with mixed anxiety and depressed mood: Secondary | ICD-10-CM | POA: Diagnosis not present

## 2023-11-07 ENCOUNTER — Ambulatory Visit: Payer: BC Managed Care – PPO | Admitting: Podiatry

## 2023-11-25 ENCOUNTER — Other Ambulatory Visit: Payer: Self-pay | Admitting: Cardiology

## 2023-11-25 DIAGNOSIS — R000 Tachycardia, unspecified: Secondary | ICD-10-CM

## 2023-11-25 DIAGNOSIS — R002 Palpitations: Secondary | ICD-10-CM

## 2023-12-12 DIAGNOSIS — J069 Acute upper respiratory infection, unspecified: Secondary | ICD-10-CM | POA: Diagnosis not present

## 2023-12-12 DIAGNOSIS — F4323 Adjustment disorder with mixed anxiety and depressed mood: Secondary | ICD-10-CM | POA: Diagnosis not present

## 2023-12-24 DIAGNOSIS — F4323 Adjustment disorder with mixed anxiety and depressed mood: Secondary | ICD-10-CM | POA: Diagnosis not present

## 2023-12-26 DIAGNOSIS — M79602 Pain in left arm: Secondary | ICD-10-CM | POA: Diagnosis not present

## 2023-12-26 DIAGNOSIS — R3915 Urgency of urination: Secondary | ICD-10-CM | POA: Diagnosis not present

## 2023-12-26 DIAGNOSIS — M79601 Pain in right arm: Secondary | ICD-10-CM | POA: Diagnosis not present

## 2023-12-26 DIAGNOSIS — R202 Paresthesia of skin: Secondary | ICD-10-CM | POA: Diagnosis not present

## 2023-12-26 DIAGNOSIS — Z113 Encounter for screening for infections with a predominantly sexual mode of transmission: Secondary | ICD-10-CM | POA: Diagnosis not present

## 2023-12-26 DIAGNOSIS — R1030 Lower abdominal pain, unspecified: Secondary | ICD-10-CM | POA: Diagnosis not present

## 2024-01-02 DIAGNOSIS — Z124 Encounter for screening for malignant neoplasm of cervix: Secondary | ICD-10-CM | POA: Diagnosis not present

## 2024-01-02 DIAGNOSIS — R3915 Urgency of urination: Secondary | ICD-10-CM | POA: Diagnosis not present

## 2024-01-02 DIAGNOSIS — R102 Pelvic and perineal pain: Secondary | ICD-10-CM | POA: Diagnosis not present

## 2024-01-06 DIAGNOSIS — R3915 Urgency of urination: Secondary | ICD-10-CM | POA: Diagnosis not present

## 2024-01-08 DIAGNOSIS — N6323 Unspecified lump in the left breast, lower outer quadrant: Secondary | ICD-10-CM | POA: Diagnosis not present

## 2024-01-08 DIAGNOSIS — N644 Mastodynia: Secondary | ICD-10-CM | POA: Diagnosis not present

## 2024-02-13 ENCOUNTER — Telehealth: Payer: Self-pay | Admitting: Cardiology

## 2024-02-13 DIAGNOSIS — G43009 Migraine without aura, not intractable, without status migrainosus: Secondary | ICD-10-CM | POA: Diagnosis not present

## 2024-02-13 DIAGNOSIS — M7741 Metatarsalgia, right foot: Secondary | ICD-10-CM | POA: Diagnosis not present

## 2024-02-13 DIAGNOSIS — L0100 Impetigo, unspecified: Secondary | ICD-10-CM | POA: Diagnosis not present

## 2024-02-13 DIAGNOSIS — M2011 Hallux valgus (acquired), right foot: Secondary | ICD-10-CM | POA: Diagnosis not present

## 2024-02-13 DIAGNOSIS — R002 Palpitations: Secondary | ICD-10-CM | POA: Diagnosis not present

## 2024-02-13 DIAGNOSIS — M19071 Primary osteoarthritis, right ankle and foot: Secondary | ICD-10-CM | POA: Diagnosis not present

## 2024-02-13 NOTE — Telephone Encounter (Signed)
 Patient states in the past few days she has been experiencing a "shaking feeling" in her chest. She states it feels like her heart is beating harder, not necessarily faster. She states this comes and goes, does not seem to correlate with activity or anxiety per her report. Denies chest pain. She describes occasionally feeling as though she needs to take a deeper breath to get enough air, does not always occur with palpitations.  She also describes feeling faint, "out of my body" while sitting 3 times in the past week. She states she is sleeping  well, staying hydrated, and does have more stress than usual as her cat is receiving chemotherapy for cancer.  Patient is currently taking propranolol 60 mg daily, but was given a new Rx from PCP for propranolol 40 mg BID. She is unsure if she should take it this way and would like Dr. Emelda Brothers input.  Will forward to Dr. Odis Hollingshead to review and advise.

## 2024-02-13 NOTE — Telephone Encounter (Signed)
 Patient c/o Palpitations: STAT if patient c/o lightheadedness, shortness of breath, or chest pain  How long have you had palpitations/irregular HR/ Afib? Are you having the symptoms now? Less than a week  Are you currently experiencing lightheadedness, SOB or CP? No  Do you have a history of afib (atrial fibrillation) or irregular heart rhythm? No  Have you checked your BP or HR? (document readings if available): yes normal  Are you experiencing any other symptoms? Heart just beating hard and she feeling faint sometimes while sitting down

## 2024-02-16 NOTE — Telephone Encounter (Signed)
 She should EITHER take Propranolol 60mg  daily OR the script given by PCP for propanolol 40 mg po bid. Not both.   If symptoms are more frequent we could do a 7 day monitor for palpitations. It could be due to stress as mention above.  Any syncope?  If she has syncope - she should go to ER for evaluation.   If needed can see APP for re-evaluation.   Stacy Capetillo [place], DO, [facility]

## 2024-02-17 NOTE — Telephone Encounter (Signed)
 Spoke with pt over the phone and explained Dr. Emelda Brothers response below. Pt stated that her PCP was aware of her currently being on Propranolol 60 mg daily and advised pt to do 40 mg BID. Explained to pt to go with her PCP recommendations for the time being since her PCP prescribed this regimen on Thursday but to make sure she stops the 60 mg when she starts the 40 mg BID.   Pt states she is still having syncopal episodes and would like to be reevaluated by APP. Scheduled pt to see Jari Favre, PA-C 3/25. Advised pt to go to ED for eval if these syncopal episodes got worse or began to last longer than normal. Pt verbalized understanding of plan of care.   She should EITHER take Propranolol 60mg  daily OR the script given by PCP for propanolol 40 mg po bid. Not both.    If symptoms are more frequent we could do a 7 day monitor for palpitations. It could be due to stress as mention above.  Any syncope?   If she has syncope - she should go to ER for evaluation.    If needed can see APP for re-evaluation.    [place], DO, [facility]

## 2024-02-18 ENCOUNTER — Ambulatory Visit: Attending: Physician Assistant

## 2024-02-18 ENCOUNTER — Ambulatory Visit: Attending: Physician Assistant | Admitting: Physician Assistant

## 2024-02-18 ENCOUNTER — Encounter: Payer: Self-pay | Admitting: Physician Assistant

## 2024-02-18 VITALS — BP 100/70 | HR 89 | Ht 65.0 in | Wt 171.2 lb

## 2024-02-18 DIAGNOSIS — Z86718 Personal history of other venous thrombosis and embolism: Secondary | ICD-10-CM

## 2024-02-18 DIAGNOSIS — R002 Palpitations: Secondary | ICD-10-CM

## 2024-02-18 DIAGNOSIS — R000 Tachycardia, unspecified: Secondary | ICD-10-CM | POA: Diagnosis not present

## 2024-02-18 DIAGNOSIS — E782 Mixed hyperlipidemia: Secondary | ICD-10-CM | POA: Diagnosis not present

## 2024-02-18 NOTE — Progress Notes (Signed)
 Cardiology Office Note:  .   Date:  02/18/2024  ID:  Stacy Wilkins, DOB Jan 05, 1986, MRN [id number] PCP: Lewis Moccasin, MD  [facility] Providers Cardiologist:  None {   History of Present Illness: .   Stacy Wilkins is a 38 y.o. female who has a past medical history of left leg DVT and bilateral PE April 2018 after foot surgery, HLD, and also palpitations here for follow-up appointment.  She also has a history of anxiety and depression  She was seen by Dr. Odis Hollingshead 05/14/2023 and presented with a chief complaint of palpitations.  She underwent a calcium score and Zio patch in the past.  Was placed on propranolol for symptom management.  When she was seen last it had been more than a year and it was to reevaluate her tachycardia.  She was doing well on her current dose of propranolol that was initiated a year ago.  However, she does forget to take the medication and when she does she notes a change in her overall wellbeing/symptoms.  Currently suffering with back pain and experiencing elevated heart rates but no lightheadedness, dizziness, syncope/near syncope events.  Denies anginal chest pain or heart failure symptoms.  Today, she presents with a history of palpitations, anxiety, blood clots in the leg and lungs, and back pain due to a degenerative disc,  with a chief complaint of palpitations. These palpitations are described as a forceful heartbeat that causes the body to rock and leads to shortness of breath. The patient reports that these symptoms have been occurring daily for the past couple of weeks. The patient has previously worn a heart monitor in 2020 for similar symptoms of palpitations. The patient is currently on propranolol for the palpitations. The patient also reports a history of anxiety, which is currently not being treated with medication. The patient has a history of blood clots in the leg and lungs, which were treated with blood thinning medication for six months.  The patient also reports back pain due to a degenerative disc, which has been treated with ablation.  Reports no chest pain, pressure, or tightness. No edema, orthopnea, PND.   Discussed the use of AI scribe software for clinical note transcription with the patient, who gave verbal consent to proceed.  ROS: Pertinent ROS in HPI  Studies Reviewed: Marland Kitchen        Coronary calcium score 05/03/2021: Coronary calcium score of 0.   Echocardiogram 04/12/2021:  Normal LV systolic function with visual EF 60-65%. Left ventricle cavity  is normal in size. Normal global wall motion. Normal diastolic filling  pattern, normal LAP.  No significant valvular abnormalities.  Compared to study dated 10/06/2018: no significant change.     Event Monitor for 7 days Start date 09/16/2019: [name] monitoring period was 09/16/2019 - 09/22/2019. Baseline sample showed Sinus Rhythm with a heart rate of 78.6 bpm. There were 0 critical, 0 serious, and 3 stable events that occurred. The report analysis of the critical, serious, stable and manually triggered events reveal NSR. There were rare PACs and PVCs.        Physical Exam:   VS:  BP 100/70   Pulse 89   Ht 5\' 5"  (1.651 m)   Wt 171 lb 3.2 oz (77.7 kg)   SpO2 98%   BMI 28.49 kg/m    Wt Readings from Last 3 Encounters:  02/18/24 171 lb 3.2 oz (77.7 kg)  07/05/23 172 lb (78 kg)  05/14/23 180 lb (81.6 kg)  GEN: Well nourished, well developed in no acute distress NECK: No JVD; No carotid bruits CARDIAC: RRR, no murmurs, rubs, gallops RESPIRATORY:  Clear to auscultation without rales, wheezing or rhonchi  ABDOMEN: Soft, non-tender, non-distended EXTREMITIES:  No edema; No deformity   ASSESSMENT AND PLAN: .    Palpitations Experiencing daily palpitations with dyspnea. Differential includes atrial fibrillation, SVT, PVCs, and PACs. Propranolol dose increase deferred due to borderline blood pressure. Zio monitor recommended to evaluate arrhythmias and  PVCs/PACs frequency. - Order 7-day Zio monitor with sensitive skin adhesive. - Advise to avoid caffeine and maintain hydration with 64 ounces of water daily. - Monitor blood pressure closely if considering medication changes.  Anxiety Worsening anxiety potentially contributing to palpitations. Not on medication for anxiety. Management may be considered if Zio monitor results are normal. - Consider anxiety management per PCP  Pulmonary Embolism/DVT History of pulmonary embolism in 2018 post-surgery. Completed anticoagulation therapy.  Hyperlipidemia LDL cholesterol at 102 mg/dL, acceptable without coronary artery disease or significant family history. Other lipid panel results normal. - Recheck lipid panel annually.  Degenerative Disc Disease Improvement in back pain symptoms post-nerve ablation.  Follow-up Plans discussed for continuity of care and review of diagnostic results. - Arrange follow-up to review Zio monitor results.      Dispo: Please return in 4 weeks to review results with APP  Signed, Sharlene Dory, PA-C

## 2024-02-18 NOTE — Patient Instructions (Signed)
 Medication Instructions:  Your physician recommends that you continue on your current medications as directed. Please refer to the Current Medication list given to you today.  *If you need a refill on your cardiac medications before your next appointment, please call your pharmacy*   Lab Work: NONE If you have labs (blood work) drawn today and your tests are completely normal, you will receive your results only by: MyChart Message (if you have MyChart) OR A paper copy in the mail If you have any lab test that is abnormal or we need to change your treatment, we will call you to review the results.   Testing/Procedures: 7 Day Zio Heart Monitor Your physician has requested that you wear a Zio heart monitor for 7 days. This will be mailed to your home with instructions on how to apply the monitor and how to return it when finished. Please allow 2 weeks after returning the heart monitor before our office calls you with the results.    Follow-Up: At [facility], you and your health needs are our priority.  As part of our continuing mission to provide you with exceptional heart care, we have created designated Provider Care Teams.  These Care Teams include your primary Cardiologist (physician) and Advanced Practice Providers (APPs -  Physician Assistants and Nurse Practitioners) who all work together to provide you with the care you need, when you need it.  We recommend signing up for the patient portal called "MyChart".  Sign up information is provided on this After Visit Summary.  MyChart is used to connect with patients for Virtual Visits (Telemedicine).  Patients are able to view lab/test results, encounter notes, upcoming appointments, etc.  Non-urgent messages can be sent to your provider as well.   To learn more about what you can do with MyChart, go to [email or web address].    Your next appointment:   4 weeks  Provider:   APP  Other Instructions  Stay hydrated (64 oz of  water each day) Avoid caffeine Check blood pressure regularly  ZIO XT- Long Term Monitor Instructions  Your physician has requested you wear a ZIO patch monitor for 7 days.  This is a single patch monitor. [employer] supplies one patch monitor per enrollment. Additional stickers are not available. Please do not apply patch if you will be having a Nuclear Stress Test,  Echocardiogram, Cardiac CT, MRI, or Chest Xray during the period you would be wearing the  monitor. The patch cannot be worn during these tests. You cannot remove and re-apply the  ZIO XT patch monitor.  Your ZIO patch monitor will be mailed 3 day USPS to your address on file. It may take 3-5 days  to receive your monitor after you have been enrolled.  Once you have received your monitor, please review the enclosed instructions. Your monitor  has already been registered assigning a specific monitor serial # to you.  Billing and Patient Assistance Program Information  We have supplied [employer] with any of your insurance information on file for billing purposes. [employer] offers a sliding scale Patient Assistance Program for patients that do not have  insurance, or whose insurance does not completely cover the cost of the ZIO monitor.  You must apply for the Patient Assistance Program to qualify for this discounted rate.  To apply, please call [employer] at [phone], select option 4, select option 2, ask to apply for  Patient Assistance Program. Meredeth Ide will ask your household income, and how many people  are in  your household. They will quote your out-of-pocket cost based on that information.  [employer] will also be able to set up a 16-month, interest-free payment plan if needed.  Applying the monitor   Shave hair from upper left chest.  Hold abrader disc by orange tab. Rub abrader in 40 strokes over the upper left chest as  indicated in your monitor instructions.  Clean area with 4 enclosed alcohol pads. Let dry.  Apply patch  as indicated in monitor instructions. Patch will be placed under collarbone on left  side of chest with arrow pointing upward.  Rub patch adhesive wings for 2 minutes. Remove white label marked "1". Remove the white  label marked "2". Rub patch adhesive wings for 2 additional minutes.  While looking in a mirror, press and release button in center of patch. A small green light will  flash 3-4 times. This will be your only indicator that the monitor has been turned on.  Do not shower for the first 24 hours. You may shower after the first 24 hours.  Press the button if you feel a symptom. You will hear a small click. Record Date, Time and  Symptom in the Patient Logbook.  When you are ready to remove the patch, follow instructions on the last 2 pages of Patient  Logbook. Stick patch monitor onto the last page of Patient Logbook.  Place Patient Logbook in the blue and white box. Use locking tab on box and tape box closed  securely. The blue and white box has prepaid postage on it. Please place it in the mailbox as  soon as possible. Your physician should have your test results approximately 7 days after the  monitor has been mailed back to [facility].  Call [facility] Customer Care at [phone] if you have questions regarding  your ZIO XT patch monitor. Call them immediately if you see an orange light blinking on your  monitor.  If your monitor falls off in less than 4 days, contact our Monitor department at [phone].  If your monitor becomes loose or falls off after 4 days call [employer] at [phone] for  suggestions on securing your monitor

## 2024-02-18 NOTE — Progress Notes (Unsigned)
 7 day [employer] long term monitor mailed to patient with sensitive skin strips.

## 2024-02-24 DIAGNOSIS — R002 Palpitations: Secondary | ICD-10-CM | POA: Diagnosis not present

## 2024-02-24 NOTE — Telephone Encounter (Signed)
 Stacy Wilkins the pt last had refill of pantoprazole in 2023 and is now taking from that last prescription.  She would like a refill until your appointment with her. Is that ok to send?

## 2024-02-25 MED ORDER — PANTOPRAZOLE SODIUM 40 MG PO TBEC: 40.0000 mg | DELAYED_RELEASE_TABLET | Freq: Every day | ORAL | 0 refills | Status: DC

## 2024-02-25 NOTE — Addendum Note (Signed)
 Addended by: Loretha Stapler on: 02/25/2024 12:41 PM   Modules accepted: Orders

## 2024-02-26 ENCOUNTER — Encounter: Payer: Self-pay | Admitting: Physician Assistant

## 2024-03-01 DIAGNOSIS — L0100 Impetigo, unspecified: Secondary | ICD-10-CM | POA: Diagnosis not present

## 2024-03-01 DIAGNOSIS — B958 Unspecified staphylococcus as the cause of diseases classified elsewhere: Secondary | ICD-10-CM | POA: Diagnosis not present

## 2024-03-22 DIAGNOSIS — R002 Palpitations: Secondary | ICD-10-CM

## 2024-03-24 ENCOUNTER — Ambulatory Visit: Admitting: Cardiology

## 2024-04-01 ENCOUNTER — Ambulatory Visit: Admitting: Physician Assistant

## 2024-04-09 DIAGNOSIS — Z0000 Encounter for general adult medical examination without abnormal findings: Secondary | ICD-10-CM | POA: Diagnosis not present

## 2024-04-09 DIAGNOSIS — Z130 Encounter for screening for diseases of the blood and blood-forming organs and certain disorders involving the immune mechanism: Secondary | ICD-10-CM | POA: Diagnosis not present

## 2024-04-09 DIAGNOSIS — J069 Acute upper respiratory infection, unspecified: Secondary | ICD-10-CM | POA: Diagnosis not present

## 2024-04-09 DIAGNOSIS — Z13228 Encounter for screening for other metabolic disorders: Secondary | ICD-10-CM | POA: Diagnosis not present

## 2024-04-09 DIAGNOSIS — Z13220 Encounter for screening for lipoid disorders: Secondary | ICD-10-CM | POA: Diagnosis not present

## 2024-04-25 ENCOUNTER — Encounter (HOSPITAL_BASED_OUTPATIENT_CLINIC_OR_DEPARTMENT_OTHER): Payer: Self-pay | Admitting: Emergency Medicine

## 2024-04-25 ENCOUNTER — Other Ambulatory Visit: Payer: Self-pay

## 2024-04-25 DIAGNOSIS — R140 Abdominal distension (gaseous): Secondary | ICD-10-CM | POA: Insufficient documentation

## 2024-04-25 DIAGNOSIS — Z5321 Procedure and treatment not carried out due to patient leaving prior to being seen by health care provider: Secondary | ICD-10-CM | POA: Insufficient documentation

## 2024-04-25 DIAGNOSIS — R0602 Shortness of breath: Secondary | ICD-10-CM | POA: Diagnosis not present

## 2024-04-25 LAB — CBC
HCT: 39.6 % (ref 36.0–46.0)
Hemoglobin: 13.5 g/dL (ref 12.0–15.0)
MCH: 30.1 pg (ref 26.0–34.0)
MCHC: 34.1 g/dL (ref 30.0–36.0)
MCV: 88.4 fL (ref 80.0–100.0)
Platelets: 199 10*3/uL (ref 150–400)
RBC: 4.48 MIL/uL (ref 3.87–5.11)
RDW: 11.9 % (ref 11.5–15.5)
WBC: 7.5 10*3/uL (ref 4.0–10.5)
nRBC: 0 % (ref 0.0–0.2)

## 2024-04-25 LAB — COMPREHENSIVE METABOLIC PANEL WITH GFR
ALT: 11 U/L (ref 0–44)
AST: 14 U/L — ABNORMAL LOW (ref 15–41)
Albumin: 4.6 g/dL (ref 3.5–5.0)
Alkaline Phosphatase: 52 U/L (ref 38–126)
Anion gap: 13 (ref 5–15)
BUN: 10 mg/dL (ref 6–20)
CO2: 22 mmol/L (ref 22–32)
Calcium: 9.9 mg/dL (ref 8.9–10.3)
Chloride: 105 mmol/L (ref 98–111)
Creatinine, Ser: 0.78 mg/dL (ref 0.44–1.00)
GFR, Estimated: >60 mL/min (ref >60)
Glucose, Bld: 117 mg/dL — ABNORMAL HIGH (ref 70–99)
Potassium: 3.9 mmol/L (ref 3.5–5.1)
Sodium: 139 mmol/L (ref 135–145)
Total Bilirubin: 0.3 mg/dL (ref 0.0–1.2)
Total Protein: 6.7 g/dL (ref 6.5–8.1)

## 2024-04-25 LAB — LIPASE, BLOOD: Lipase: 39 U/L (ref 11–51)

## 2024-04-25 NOTE — ED Triage Notes (Signed)
 Pt arrives POV w/ c/o bloating, SHOB, and a "weird feeling in her throat" that makes her have an urge to cough. Pt reports she has had these symptoms x 1 week.

## 2024-04-26 ENCOUNTER — Emergency Department (HOSPITAL_BASED_OUTPATIENT_CLINIC_OR_DEPARTMENT_OTHER)
Admission: EM | Admit: 2024-04-26 | Discharge: 2024-04-26 | Discharge status: Against Medical Advice | Attending: Emergency Medicine | Admitting: Emergency Medicine

## 2024-04-30 ENCOUNTER — Other Ambulatory Visit: Payer: Self-pay | Admitting: Physician Assistant

## 2024-04-30 DIAGNOSIS — J019 Acute sinusitis, unspecified: Secondary | ICD-10-CM | POA: Diagnosis not present

## 2024-05-07 ENCOUNTER — Encounter: Payer: Self-pay | Admitting: Cardiology

## 2024-05-07 ENCOUNTER — Ambulatory Visit: Attending: Cardiology | Admitting: Cardiology

## 2024-05-07 VITALS — BP 110/80 | HR 90 | Resp 16 | Ht 65.0 in | Wt 169.8 lb

## 2024-05-07 DIAGNOSIS — Z86718 Personal history of other venous thrombosis and embolism: Secondary | ICD-10-CM | POA: Diagnosis not present

## 2024-05-07 DIAGNOSIS — R000 Tachycardia, unspecified: Secondary | ICD-10-CM

## 2024-05-07 DIAGNOSIS — R002 Palpitations: Secondary | ICD-10-CM | POA: Diagnosis not present

## 2024-05-07 DIAGNOSIS — E782 Mixed hyperlipidemia: Secondary | ICD-10-CM | POA: Diagnosis not present

## 2024-05-07 DIAGNOSIS — Z86711 Personal history of pulmonary embolism: Secondary | ICD-10-CM

## 2024-05-07 MED ORDER — PROPRANOLOL HCL ER 60 MG PO CP24: 60.0000 mg | ORAL_CAPSULE | Freq: Every day | ORAL | 3 refills | Status: AC

## 2024-05-07 NOTE — Progress Notes (Signed)
 error

## 2024-05-07 NOTE — Progress Notes (Signed)
 Cardiology Office Note:  .   Date:  05/09/2024  ID:  Stacy Wilkins, DOB December 29, 1985, MRN [id number] PCP:  Aleta Anda, MD  Former Cardiology Providers: Dr. Knox Perl, Mercy Stall, PA [facility] Providers Cardiologist:  Olinda Bertrand, DO , [facility]  Electrophysiologist:  None  Click to update primary MD,subspecialty MD or APP then REFRESH:1}    Chief Complaint  Patient presents with   Palpitations   Follow-up    History of Present Illness: .   Stacy Wilkins is a 38 y.o. Caucasian female whose past medical history and cardiovascular risk factors includes: left leg DVT and bilateral PE in April 2018 after foot surgery/on OCP.  Patient had establish with the practice back in 2020 for palpitations and has been seen by other providers in the practice I saw her last in June 2024 and she saw Stacy Wilkins back in March 2025.  Given her palpitations she did undergo cardiac monitor in the past and subsequently was placed on propranolol  and the dose has been titrated.  During her last office visit with Stacy Wilkins in March 2025 it was recommended that she have a repeat Zio monitor to evaluate for dysrhythmias given her symptoms of palpitations that were occurring daily with dyspnea.  She was also recommended to follow-up with PCP to discuss management of her underlying anxiety which could be contributing to her palpitations as well.  Palpitations have remained the same but not worsening.   Right sided headaches - has spoken to PCP and treated for sinus infection and using OTC medications. No focal deficits.  At times Stacy Wilkins but no sycnope.  No prior history of migraines.  Review of Systems: .   Review of Systems  Cardiovascular:  Positive for palpitations. Negative for chest pain, claudication, irregular heartbeat, leg swelling, near-syncope, orthopnea, paroxysmal nocturnal dyspnea and syncope.  Respiratory:  Negative for shortness of breath.   Hematologic/Lymphatic: Negative for bleeding  problem.  Neurological:  Positive for headaches and light-headedness.    Studies Reviewed:   EKG: EKG Interpretation Date/Time:  Thursday May 07 2024 09:13:36 EDT Ventricular Rate:  80 PR Interval:  146 QRS Duration:  64 QT Interval:  344 QTC Calculation: 396 R Axis:   37  Text Interpretation: Normal sinus rhythm Normal ECG When compared with ECG of 25-Apr-2024 20:26, PREVIOUS ECG IS PRESENT Confirmed by Olinda Bertrand [phone]) on 05/07/2024 9:31:23 AM  Echocardiogram: 04/12/2021:  Normal LV systolic function with visual EF 60-65%. Left ventricle cavity  is normal in size. Normal global wall motion. Normal diastolic filling  pattern, normal LAP.  No significant valvular abnormalities.  Compared to study dated 10/06/2018: no significant change.     Coronary calcium score 05/03/2021: Coronary calcium score of 0.   Cardiac monitor (Zio Patch): 02/24/2024 - 02/26/2024 Dominant rhythm sinus, tachycardia burden 13%. Heart rate 58-147 bpm.  Avg HR 90 bpm. No atrial fibrillation detected during the monitoring period. No supraventricular tachycardia, ventricular tachycardia, high grade AV block, pauses (3 seconds or longer). No supraventricular or ventricular ectopic burden. Patient triggered events: 6.  Underlying rhythm either sinus or sinus tachycardia without sustained arrhythmias.   RADIOLOGY: NA  Risk Assessment/Calculations:   NA   Labs:       Latest Ref Rng & Units 04/25/2024    8:26 PM 04/26/2023    3:47 PM 02/08/2023   10:18 PM  CBC  WBC 4.0 - 10.5 K/uL 7.5  9.3  8.4   Hemoglobin 12.0 - 15.0 g/dL 62.1  13.8  13.6   Hematocrit 36.0 - 46.0 % 39.6  40.2  39.5   Platelets 150 - 400 K/uL 199  228  210        Latest Ref Rng & Units 04/25/2024    8:26 PM 04/26/2023    3:47 PM 02/08/2023   10:18 PM  BMP  Glucose 70 - 99 mg/dL 829  95  562   BUN 6 - 20 mg/dL 10  16  9    Creatinine 0.44 - 1.00 mg/dL 1.30  8.65  7.84   Sodium 135 - 145 mmol/L 139  136  137   Potassium  3.5 - 5.1 mmol/L 3.9  3.9  3.5   Chloride 98 - 111 mmol/L 105  103  105   CO2 22 - 32 mmol/L 22  26  25    Calcium 8.9 - 10.3 mg/dL 9.9  8.8  9.3       Latest Ref Rng & Units 04/25/2024    8:26 PM 04/26/2023    3:47 PM 02/08/2023   10:18 PM  CMP  Glucose 70 - 99 mg/dL 696  95  295   BUN 6 - 20 mg/dL 10  16  9    Creatinine 0.44 - 1.00 mg/dL 2.84  1.32  4.40   Sodium 135 - 145 mmol/L 139  136  137   Potassium 3.5 - 5.1 mmol/L 3.9  3.9  3.5   Chloride 98 - 111 mmol/L 105  103  105   CO2 22 - 32 mmol/L 22  26  25    Calcium 8.9 - 10.3 mg/dL 9.9  8.8  9.3   Total Protein 6.5 - 8.1 g/dL 6.7     Total Bilirubin 0.0 - 1.2 mg/dL 0.3     Alkaline Phos 38 - 126 U/L 52     AST 15 - 41 U/L 14     ALT 0 - 44 U/L 11       Lab Results  Component Value Date   CHOL 155 09/27/2017   HDL 40 09/27/2017   LDLCALC 106 (H) 09/27/2017   TRIG 47 09/27/2017   CHOLHDL 3.9 09/27/2017   No results for input(s): LIPOA in the last 8760 hours. No components found for: NTPROBNP No results for input(s): PROBNP in the last 8760 hours. No results for input(s): TSH in the last 8760 hours.  Physical Exam:    Today's Vitals   05/07/24 0909  BP: 110/80  Pulse: 90  Resp: 16  SpO2: 98%  Weight: 169 lb 12.8 oz (77 kg)  Height: 5' 5 (1.651 m)   Body mass index is 28.26 kg/m. Wt Readings from Last 3 Encounters:  05/07/24 169 lb 12.8 oz (77 kg)  02/18/24 171 lb 3.2 oz (77.7 kg)  07/05/23 172 lb (78 kg)    Physical Exam  Constitutional: No distress.  hemodynamically stable  Neck: No JVD present.  Cardiovascular: Normal rate, regular rhythm, S1 normal and S2 normal. Exam reveals no gallop, no S3 and no S4.  No murmur heard. Pulmonary/Chest: Effort normal and breath sounds normal. No stridor. She has no wheezes. She has no rales.  Musculoskeletal:        General: No edema.     Cervical back: Neck supple.  Skin: Skin is warm.     Impression & Recommendation(s):  Impression:   ICD-10-CM    1. Palpitations  R00.2 EKG 12-Lead    propranolol  ER (INDERAL  LA) 60 MG 24 hr capsule    2. Tachycardia  R00.0  propranolol  ER (INDERAL  LA) 60 MG 24 hr capsule    3. Mixed hyperlipidemia  E78.2     4. History of DVT (deep vein thrombosis)  Z86.718     5. History of pulmonary embolism  Z86.711        Recommendation(s):  Palpitations Tachycardia Chronic and stable. Currently on propranolol  60 mg p.o. daily.  Has not tolerated higher doses well without becoming symptomatic. Has undergone cardiac monitor since last office visit-results reviewed Confounding factors could also be her underlying anxiety.  I have asked her to follow-up with PCP to have this addressed.  Once anxiety is better controlled and she continues to have symptoms would benefit from EP evaluation to see if she would be a candidate for intervention such as EP study to further evaluate her symptoms Refill propranolol   Mixed hyperlipidemia Known history of hyperlipidemia. Last LDL 121 mg/dL as of May 2025, [facility] database Coronary calcium score is 0. Shared decision was to continue with improving her modifiable cardiovascular risk factors. Cardiology following peripherally, managed by primary care provider.  Patient endorses episodes of having headaches in the interim.  Recommended that she follows up with PCP and consider neurology evaluation if deemed appropriate.   Orders Placed:  Orders Placed This Encounter  Procedures   EKG 12-Lead     Final Medication List:    Meds ordered this encounter  Medications   propranolol  ER (INDERAL  LA) 60 MG 24 hr capsule    Sig: Take 1 capsule (60 mg total) by mouth daily.    Dispense:  90 capsule    Refill:  3    Medications Discontinued During This Encounter  Medication Reason   propranolol  ER (INDERAL  LA) 60 MG 24 hr capsule Reorder   dicyclomine  (BENTYL ) 10 MG capsule Patient Preference     Current Outpatient Medications:    AMBULATORY NON FORMULARY MEDICATION,  Nitroglycerine ointment 0.125 %  Apply a pea sized amount internally three to four times daily. Dispense 30 GM one refill, Disp: 30 g, Rfl: 1   AMBULATORY NON FORMULARY MEDICATION, Medication Name: Diltiazem 2% gel - Using your index finger, apply a small amount of medication inside the rectum up to your first knuckle/joint three times daily x 6-8 weeks., Disp: 45 g, Rfl: 1   Azelastine HCl 137 MCG/SPRAY SOLN, Place 1 spray into the nose as needed., Disp: , Rfl:    etonogestrel  (NEXPLANON ) 68 MG IMPL implant, See admin instructions., Disp: , Rfl:    fluticasone  (FLONASE ) 50 MCG/ACT nasal spray, SPRAY 2 SPRAYS INTO EACH NOSTRIL EVERY DAY, Disp: 16 g, Rfl: 1   Multiple Vitamins-Minerals (THERA-M) TABS, Take 1 tablet by mouth daily. , Disp: , Rfl:    ondansetron  (ZOFRAN ) 4 MG tablet, Take 1 tablet (4 mg total) by mouth every 12 (twelve) hours as needed for nausea., Disp: 20 tablet, Rfl: 0   pantoprazole  (PROTONIX ) 40 MG tablet, TAKE 1 TABLET BY MOUTH EVERY DAY, Disp: 60 tablet, Rfl: 0   valACYclovir (VALTREX) 500 MG tablet, Take 1 tablet by mouth as needed. , Disp: , Rfl:    propranolol  ER (INDERAL  LA) 60 MG 24 hr capsule, Take 1 capsule (60 mg total) by mouth daily., Disp: 90 capsule, Rfl: 3  Consent:   NA  Disposition:   1 year follow-up sooner if needed  Her questions and concerns were addressed to her satisfaction. She voices understanding of the recommendations provided during this encounter.    Signed, Olinda Bertrand, DO, [facility]  A  Division of [facility] [address], [place], [place] [address]  [place], [place] [address]

## 2024-05-07 NOTE — Patient Instructions (Signed)
 Medication Instructions:  Refill for Propranolol  has been sent to your pharmacy.  *If you need a refill on your cardiac medications before your next appointment, please call your pharmacy*  Lab Work: None ordered today. If you have labs (blood work) drawn today and your tests are completely normal, you will receive your results only by: MyChart Message (if you have MyChart) OR A paper copy in the mail If you have any lab test that is abnormal or we need to change your treatment, we will call you to review the results.  Testing/Procedures: None ordered today.  Follow-Up: At [facility], you and your health needs are our priority.  As part of our continuing mission to provide you with exceptional heart care, our providers are all part of one team.  This team includes your primary Cardiologist (physician) and Advanced Practice Providers or APPs (Physician Assistants and Nurse Practitioners) who all work together to provide you with the care you need, when you need it.  Your next appointment:   1 year(s)  Provider:   Olinda Bertrand, DO

## 2024-05-09 ENCOUNTER — Encounter: Payer: Self-pay | Admitting: Cardiology

## 2024-05-12 DIAGNOSIS — R3989 Other symptoms and signs involving the genitourinary system: Secondary | ICD-10-CM | POA: Diagnosis not present

## 2024-05-14 ENCOUNTER — Ambulatory Visit: Payer: Self-pay | Admitting: Cardiology

## 2024-05-14 DIAGNOSIS — Z13220 Encounter for screening for lipoid disorders: Secondary | ICD-10-CM | POA: Diagnosis not present

## 2024-05-14 DIAGNOSIS — R432 Parageusia: Secondary | ICD-10-CM | POA: Diagnosis not present

## 2024-05-14 DIAGNOSIS — R002 Palpitations: Secondary | ICD-10-CM | POA: Diagnosis not present

## 2024-05-14 DIAGNOSIS — R202 Paresthesia of skin: Secondary | ICD-10-CM | POA: Diagnosis not present

## 2024-05-19 DIAGNOSIS — R102 Pelvic and perineal pain: Secondary | ICD-10-CM | POA: Diagnosis not present

## 2024-05-19 DIAGNOSIS — R350 Frequency of micturition: Secondary | ICD-10-CM | POA: Diagnosis not present

## 2024-06-03 ENCOUNTER — Encounter: Payer: Self-pay | Admitting: Cardiology

## 2024-06-03 ENCOUNTER — Ambulatory Visit: Attending: Cardiovascular Disease | Admitting: *Deleted

## 2024-06-03 VITALS — BP 114/68 | HR 77

## 2024-06-03 DIAGNOSIS — R002 Palpitations: Secondary | ICD-10-CM

## 2024-06-03 DIAGNOSIS — R000 Tachycardia, unspecified: Secondary | ICD-10-CM

## 2024-06-03 NOTE — Telephone Encounter (Signed)
 If she still having symptoms she can come in today for nurse visit for EKG check.  We had discussed referral to EP if palpitations persist despite being on propranolol  even after addressing her anxiety.  Her current episode was likely situational brought on by alcohol consumption (reversible risk factor).  We could monitor for now and if symptoms increase in intensity or frequency we consider consultation with EP.  Let me know your thoughts  [name], DO, FACC

## 2024-06-03 NOTE — Progress Notes (Signed)
   Nurse Visit   Date of Encounter: 06/03/2024 ID: Harlene DELENA Corona, DOB 1985/12/11, MRN [id number]  PCP:  Waylan Almarie SAUNDERS, MD   [facility] Providers Cardiologist:  Madonna Large, DO      Visit Details   VS:  BP 114/68 (BP Location: Right Arm, Patient Position: Sitting, Cuff Size: Normal)   Pulse 77   SpO2 99%  , BMI There is no height or weight on file to calculate BMI.  Wt Readings from Last 3 Encounters:  05/07/24 169 lb 12.8 oz (77 kg)  02/18/24 171 lb 3.2 oz (77.7 kg)  07/05/23 172 lb (78 kg)     Reason for visit: EKG ; pt c/o palpitations/fluttering Performed today: Vitals, EKG, Provider consulted:Dr. Large, and Education; educated pt on staying very well hydrated as she was at an outdoor event/concert on 06/02/24 and she states she had only one alcoholic drink, but has been hydrating today and staying out of the hot weather.; Pt states she would like to accept the consult to EP as recommended per Dr. Large Changes (medications, testing, etc.) : NO Changes Length of Visit: 10 minutes    Medications Adjustments/Labs and Tests Ordered: Orders Placed This Encounter  Procedures   EKG 12-Lead   No orders of the defined types were placed in this encounter.    Signed, Zara Buel Fret, RN  06/03/2024 4:00 PM   Addendum:  Came in for RN visit due to increased palpitations.  This time it may have been situational (alcohol intake after a long time).  Conservative management recommended.  However, patient is quite concerned due to a long hx of palpitations despite being on medical tx so requesting EP evaluation.   Sunit Lombard, DO, FACC

## 2024-06-03 NOTE — Telephone Encounter (Signed)
 Stacy Wilkins,   Please consult Dr. Waddell / Dr. Kennyth for tachycardia/palpitations refractory to medical tx.   Stacy Wilkins Alexander, DO, FACC

## 2024-06-05 ENCOUNTER — Encounter: Payer: Self-pay | Admitting: *Deleted

## 2024-06-11 DIAGNOSIS — R053 Chronic cough: Secondary | ICD-10-CM | POA: Diagnosis not present

## 2024-06-11 DIAGNOSIS — R519 Headache, unspecified: Secondary | ICD-10-CM | POA: Diagnosis not present

## 2024-06-25 DIAGNOSIS — R053 Chronic cough: Secondary | ICD-10-CM | POA: Diagnosis not present

## 2024-06-25 DIAGNOSIS — L820 Inflamed seborrheic keratosis: Secondary | ICD-10-CM | POA: Diagnosis not present

## 2024-06-25 DIAGNOSIS — D225 Melanocytic nevi of trunk: Secondary | ICD-10-CM | POA: Diagnosis not present

## 2024-06-25 DIAGNOSIS — D485 Neoplasm of uncertain behavior of skin: Secondary | ICD-10-CM | POA: Diagnosis not present

## 2024-06-25 DIAGNOSIS — R052 Subacute cough: Secondary | ICD-10-CM | POA: Diagnosis not present

## 2024-06-26 DIAGNOSIS — R053 Chronic cough: Secondary | ICD-10-CM | POA: Diagnosis not present

## 2024-06-28 DIAGNOSIS — N898 Other specified noninflammatory disorders of vagina: Secondary | ICD-10-CM | POA: Diagnosis not present

## 2024-07-02 DIAGNOSIS — N760 Acute vaginitis: Secondary | ICD-10-CM | POA: Diagnosis not present

## 2024-07-02 DIAGNOSIS — N390 Urinary tract infection, site not specified: Secondary | ICD-10-CM | POA: Diagnosis not present

## 2024-07-02 DIAGNOSIS — N898 Other specified noninflammatory disorders of vagina: Secondary | ICD-10-CM | POA: Diagnosis not present

## 2024-07-02 DIAGNOSIS — N771 Vaginitis, vulvitis and vulvovaginitis in diseases classified elsewhere: Secondary | ICD-10-CM | POA: Diagnosis not present

## 2024-07-03 DIAGNOSIS — R9389 Abnormal findings on diagnostic imaging of other specified body structures: Secondary | ICD-10-CM | POA: Diagnosis not present

## 2024-07-06 NOTE — Progress Notes (Deleted)
  Electrophysiology Office Note:   Date:  07/06/2024  ID:  Stacy Wilkins, DOB 1986-07-25, MRN [id number]  Primary Cardiologist: Madonna Large, DO Electrophysiologist: Fonda Kitty, MD  {Click to update primary MD,subspecialty MD or APP then REFRESH:1}    History of Present Illness:   Stacy Wilkins is a 38 y.o. female with h/o LLE DVT and bilateral PE, palpitations who is being seen today for palpitations.  Discussed the use of AI scribe software for clinical note transcription with the patient, who gave verbal consent to proceed.  History of Present Illness     Review of systems complete and found to be negative unless listed in HPI.   EP Information / Studies Reviewed:    EKG is not ordered today. EKG from 06/03/24 reviewed which showed sinus rhythm.      Echocardiogram 04/12/2021:  Normal LV systolic function with visual EF 60-65%. Left ventricle cavity  is normal in size. Normal global wall motion. Normal diastolic filling  pattern, normal LAP.  No significant valvular abnormalities.  Compared to study dated 10/06/2018: no significant change.       Physical Exam:   VS:  There were no vitals taken for this visit.   Wt Readings from Last 3 Encounters:  05/07/24 169 lb 12.8 oz (77 kg)  02/18/24 171 lb 3.2 oz (77.7 kg)  07/05/23 172 lb (78 kg)     GEN: Well nourished, well developed in no acute distress NECK: No JVD CARDIAC: {EPRHYTHM:28826}, no murmurs, rubs, gallops RESPIRATORY:  Clear to auscultation without rales, wheezing or rhonchi  ABDOMEN: Soft, non-distended EXTREMITIES:  No edema; No deformity   ASSESSMENT AND PLAN:    #Palpitations: #Sinus tachycardia: - Have not seen evidence of sustained SVT or high burden of ectopy. Given this, do not see benefit of AAD therapy or invasive procedures.  - Continue propranolol .  - Encouraged hydration.  - Encouraged regular exercise.  Assessment & Plan       Follow up with {EPMDS:28135::EP Team} {EPFOLLOW  LE:71826}  Signed, Fonda Kitty, MD

## 2024-07-07 ENCOUNTER — Ambulatory Visit: Attending: Cardiology | Admitting: Cardiology

## 2024-07-07 DIAGNOSIS — R000 Tachycardia, unspecified: Secondary | ICD-10-CM

## 2024-07-07 DIAGNOSIS — R002 Palpitations: Secondary | ICD-10-CM

## 2024-07-21 DIAGNOSIS — R140 Abdominal distension (gaseous): Secondary | ICD-10-CM | POA: Diagnosis not present

## 2024-07-21 DIAGNOSIS — R110 Nausea: Secondary | ICD-10-CM | POA: Diagnosis not present

## 2024-07-21 DIAGNOSIS — R1013 Epigastric pain: Secondary | ICD-10-CM | POA: Diagnosis not present

## 2024-07-22 NOTE — Progress Notes (Signed)
" °  Electrophysiology Office Note:   Date:  07/24/2024  ID:  Stacy Wilkins, DOB 1986/04/14, MRN [id number]  Primary Cardiologist: Madonna Large, DO Electrophysiologist: Fonda Kitty, MD      History of Present Illness:   Stacy Wilkins is a 38 y.o. female with h/o LLE DVT and bilateral PE, palpitations who is being seen today for palpitations.  Discussed the use of AI scribe software for clinical note transcription with the patient, who gave verbal consent to proceed.  History of Present Illness Stacy Wilkins is a 38 year old female who presents with palpitations and irregular heartbeats.  She experiences palpitations primarily at night, describing them as a 'flutter' or 'bouncing all around' sensation in her chest. She wore a heart monitor during which she experienced episodes of palpitations. During the monitoring period, she experienced episodes of sinus tachycardia, with her heart rate reaching around 100 beats per minute. Her watch notifies her of high heart rates during expected situations, such as attending a concert.  She denies any history of elevated heart rates notifications when resting or relaxing.  She takes propranolol , one capsule daily, usually around 6 or 7 in the evening. Her resting heart rate is typically in the 80s to low 90s.  Otherwise doing relatively well with no new or acute complaints today.    Review of systems complete and found to be negative unless listed in HPI.   EP Information / Studies Reviewed:    EKG is not ordered today. EKG from 06/03/24 reviewed which showed sinus rhythm.      Echocardiogram 04/12/2021:  Normal LV systolic function with visual EF 60-65%. Left ventricle cavity  is normal in size. Normal global wall motion. Normal diastolic filling  pattern, normal LAP.  No significant valvular abnormalities.  Compared to study dated 10/06/2018: no significant change.       Physical Exam:   VS:  BP 107/72   Pulse 81   Ht 5' 5 (1.651  m)   Wt 173 lb (78.5 kg)   SpO2 97%   BMI 28.79 kg/m    Wt Readings from Last 3 Encounters:  07/23/24 173 lb (78.5 kg)  05/07/24 169 lb 12.8 oz (77 kg)  02/18/24 171 lb 3.2 oz (77.7 kg)     GEN: Well nourished, well developed in no acute distress NECK: No JVD CARDIAC: Normal rate, regular rhythm RESPIRATORY:  Clear to auscultation without rales, wheezing or rhonchi  ABDOMEN: Soft, non-distended EXTREMITIES:  No edema; No deformity   ASSESSMENT AND PLAN:    #Palpitations: I suspect these are mainly from PACs.  Ectopic beats overall are very low burden. #Sinus tachycardia: Intermittent, likely appropriate response.  Reports problems with hydration. - Have not seen evidence of sustained SVT or high burden of ectopy. Given this, do not see benefit of AAD therapy or invasive procedures.  - Continue propranolol .  Can take additional 30 mg as needed. - Encouraged hydration.  - Encouraged regular exercise.  - Will order echocardiogram.  Follow up with general cardiology, Dr. Large.  No EP needs at this time.  Signed, Fonda Kitty, MD  "

## 2024-07-23 ENCOUNTER — Ambulatory Visit: Attending: Cardiology | Admitting: Cardiology

## 2024-07-23 ENCOUNTER — Encounter: Payer: Self-pay | Admitting: Cardiology

## 2024-07-23 VITALS — BP 107/72 | HR 81 | Ht 65.0 in | Wt 173.0 lb

## 2024-07-23 DIAGNOSIS — R002 Palpitations: Secondary | ICD-10-CM | POA: Diagnosis not present

## 2024-07-23 DIAGNOSIS — R000 Tachycardia, unspecified: Secondary | ICD-10-CM | POA: Diagnosis not present

## 2024-07-23 NOTE — Patient Instructions (Signed)
 Medication Instructions:  Your physician recommends that you continue on your current medications as directed. Please refer to the Current Medication list given to you today.  *If you need a refill on your cardiac medications before your next appointment, please call your pharmacy*  Testing/Procedures: Echocardiogram Your physician has requested that you have an echocardiogram. Echocardiography is a painless test that uses sound waves to create images of your heart. It provides your doctor with information about the size and shape of your heart and how well your heart's chambers and valves are working. This procedure takes approximately one hour. There are no restrictions for this procedure. Please do NOT wear cologne, perfume, aftershave, or lotions (deodorant is allowed). Please arrive 15 minutes prior to your appointment time.  Please note: We ask at that you not bring children with you during ultrasound (echo/ vascular) testing. Due to room size and safety concerns, children are not allowed in the ultrasound rooms during exams. Our front office staff cannot provide observation of children in our lobby area while testing is being conducted. An adult accompanying a patient to their appointment will only be allowed in the ultrasound room at the discretion of the ultrasound technician under special circumstances. We apologize for any inconvenience.  Follow-Up: At [facility], you and your health needs are our priority.  As part of our continuing mission to provide you with exceptional heart care, our providers are all part of one team.  This team includes your primary Cardiologist (physician) and Advanced Practice Providers or APPs (Physician Assistants and Nurse Practitioners) who all work together to provide you with the care you need, when you need it.  Your next appointment:   As needed with Dr. Kennyth

## 2024-08-03 DIAGNOSIS — R140 Abdominal distension (gaseous): Secondary | ICD-10-CM | POA: Diagnosis not present

## 2024-08-03 DIAGNOSIS — G43009 Migraine without aura, not intractable, without status migrainosus: Secondary | ICD-10-CM | POA: Diagnosis not present

## 2024-08-03 DIAGNOSIS — Z87891 Personal history of nicotine dependence: Secondary | ICD-10-CM | POA: Diagnosis not present

## 2024-08-03 DIAGNOSIS — K219 Gastro-esophageal reflux disease without esophagitis: Secondary | ICD-10-CM | POA: Diagnosis not present

## 2024-08-03 DIAGNOSIS — M5481 Occipital neuralgia: Secondary | ICD-10-CM | POA: Diagnosis not present

## 2024-08-03 DIAGNOSIS — R1013 Epigastric pain: Secondary | ICD-10-CM | POA: Diagnosis not present

## 2024-08-03 DIAGNOSIS — R110 Nausea: Secondary | ICD-10-CM | POA: Diagnosis not present

## 2024-08-03 DIAGNOSIS — Z8711 Personal history of peptic ulcer disease: Secondary | ICD-10-CM | POA: Diagnosis not present

## 2024-08-12 DIAGNOSIS — K219 Gastro-esophageal reflux disease without esophagitis: Secondary | ICD-10-CM | POA: Diagnosis not present

## 2024-08-12 DIAGNOSIS — R070 Pain in throat: Secondary | ICD-10-CM | POA: Diagnosis not present

## 2024-08-12 DIAGNOSIS — H9201 Otalgia, right ear: Secondary | ICD-10-CM | POA: Diagnosis not present

## 2024-08-15 DIAGNOSIS — S0086XA Insect bite (nonvenomous) of other part of head, initial encounter: Secondary | ICD-10-CM | POA: Diagnosis not present

## 2024-08-15 DIAGNOSIS — W57XXXA Bitten or stung by nonvenomous insect and other nonvenomous arthropods, initial encounter: Secondary | ICD-10-CM | POA: Diagnosis not present

## 2024-08-17 ENCOUNTER — Encounter (HOSPITAL_BASED_OUTPATIENT_CLINIC_OR_DEPARTMENT_OTHER): Payer: Self-pay

## 2024-08-18 ENCOUNTER — Ambulatory Visit (HOSPITAL_BASED_OUTPATIENT_CLINIC_OR_DEPARTMENT_OTHER)

## 2024-08-18 DIAGNOSIS — R002 Palpitations: Secondary | ICD-10-CM

## 2024-08-18 DIAGNOSIS — R000 Tachycardia, unspecified: Secondary | ICD-10-CM | POA: Diagnosis not present

## 2024-08-18 LAB — ECHOCARDIOGRAM COMPLETE
Area-P 1/2: 4.39 cm2
S' Lateral: 2.38 cm

## 2024-08-20 DIAGNOSIS — R5382 Chronic fatigue, unspecified: Secondary | ICD-10-CM | POA: Diagnosis not present

## 2024-08-20 DIAGNOSIS — R053 Chronic cough: Secondary | ICD-10-CM | POA: Diagnosis not present

## 2024-08-21 ENCOUNTER — Other Ambulatory Visit: Payer: Self-pay

## 2024-08-21 DIAGNOSIS — R0789 Other chest pain: Secondary | ICD-10-CM | POA: Diagnosis not present

## 2024-08-21 LAB — CBC
HCT: 39.6 % (ref 36.0–46.0)
Hemoglobin: 13.6 g/dL (ref 12.0–15.0)
MCH: 30.3 pg (ref 26.0–34.0)
MCHC: 34.3 g/dL (ref 30.0–36.0)
MCV: 88.2 fL (ref 80.0–100.0)
Platelets: 236 K/uL (ref 150–400)
RBC: 4.49 MIL/uL (ref 3.87–5.11)
RDW: 11.7 % (ref 11.5–15.5)
WBC: 8.5 K/uL (ref 4.0–10.5)
nRBC: 0 % (ref 0.0–0.2)

## 2024-08-21 NOTE — ED Triage Notes (Signed)
 Pt POV reporting L side chest pain and radiating to back x3 days, denies SOB, hx anxiety.

## 2024-08-22 ENCOUNTER — Emergency Department (HOSPITAL_BASED_OUTPATIENT_CLINIC_OR_DEPARTMENT_OTHER)

## 2024-08-22 ENCOUNTER — Emergency Department (HOSPITAL_BASED_OUTPATIENT_CLINIC_OR_DEPARTMENT_OTHER)
Admission: EM | Admit: 2024-08-22 | Discharge: 2024-08-22 | Disposition: A | Discharge status: Home / Self Care | Attending: Emergency Medicine | Admitting: Emergency Medicine

## 2024-08-22 DIAGNOSIS — R0789 Other chest pain: Secondary | ICD-10-CM

## 2024-08-22 DIAGNOSIS — R079 Chest pain, unspecified: Secondary | ICD-10-CM | POA: Diagnosis not present

## 2024-08-22 LAB — BASIC METABOLIC PANEL WITH GFR
Anion gap: 13 (ref 5–15)
BUN: 15 mg/dL (ref 6–20)
CO2: 22 mmol/L (ref 22–32)
Calcium: 9.4 mg/dL (ref 8.9–10.3)
Chloride: 103 mmol/L (ref 98–111)
Creatinine, Ser: 0.81 mg/dL (ref 0.44–1.00)
GFR, Estimated: >60 mL/min (ref >60)
Glucose, Bld: 79 mg/dL (ref 70–99)
Potassium: 3.9 mmol/L (ref 3.5–5.1)
Sodium: 139 mmol/L (ref 135–145)

## 2024-08-22 LAB — TROPONIN T, HIGH SENSITIVITY: Troponin T High Sensitivity: <15 ng/L (ref 0–19)

## 2024-08-22 NOTE — ED Provider Notes (Signed)
 [place] EMERGENCY DEPARTMENT AT [facility] Provider Note   CSN: [id number] Arrival date & time: 08/21/24  2318     Patient presents with: Chest Pain   Stacy Wilkins is a 38 y.o. female.   Patient is a 38 year old female with past medical history of DVT/PE, GERD.  Patient presenting today with complaints of chest discomfort.  She describes a 3-day history of sharp pain behind her left breast.  Symptoms initially were intermittent, but have been more constant.  She denies experiencing any shortness of breath and there is no pleuritic component to her discomfort.  No fevers, chills, or cough.  No aggravating or alleviating factors.       Prior to Admission medications   Medication Sig Start Date End Date Taking? Authorizing Provider  AMBULATORY NON FORMULARY MEDICATION Nitroglycerine ointment 0.125 %  Apply a pea sized amount internally three to four times daily. Dispense 30 GM one refill 05/09/22   Nandigam, Kavitha V, MD  AMBULATORY NON FORMULARY MEDICATION Medication Name: Diltiazem 2% gel - Using your index finger, apply a small amount of medication inside the rectum up to your first knuckle/joint three times daily x 6-8 weeks. 07/05/23   Beather Delon Gibson, PA  Azelastine HCl 137 MCG/SPRAY SOLN Place 1 spray into the nose as needed. 02/13/24   [provider]  etonogestrel  (NEXPLANON ) 68 MG IMPL implant See admin instructions.    [provider]  fluticasone  (FLONASE ) 50 MCG/ACT nasal spray SPRAY 2 SPRAYS INTO EACH NOSTRIL EVERY DAY 04/18/18   Wiseman, Brittany D, PA-C  Multiple Vitamins-Minerals (THERA-M) TABS Take 1 tablet by mouth daily.     [provider]  ondansetron  (ZOFRAN ) 4 MG tablet Take 1 tablet (4 mg total) by mouth every 12 (twelve) hours as needed for nausea. 01/07/23   Kerman Vina HERO, NP  pantoprazole  (PROTONIX ) 40 MG tablet TAKE 1 TABLET BY MOUTH EVERY DAY 04/30/24   Beather Delon Gibson, PA  propranolol  ER (INDERAL  LA) 60  MG 24 hr capsule Take 1 capsule (60 mg total) by mouth daily. 05/07/24   Tolia, Sunit, DO  valACYclovir (VALTREX) 500 MG tablet Take 1 tablet by mouth as needed.  08/27/19   [provider]    Allergies: Ciprofloxacin, Nitrofurantoin , Esomeprazole  magnesium , Bacitracin, Mustard, Neo-bacit-poly-lidocaine , Nickel, Onion, and Other    Review of Systems  All other systems reviewed and are negative.   Updated Vital Signs BP 117/83 (BP Location: Right Arm)   Pulse 88   Temp 98.4 F (36.9 C) (Oral)   Resp 15   Ht 5' 5 (1.651 m)   Wt 79.4 kg   SpO2 100%   BMI 29.12 kg/m   Physical Exam Vitals and nursing note reviewed.  Constitutional:      General: She is not in acute distress.    Appearance: She is well-developed. She is not diaphoretic.  HENT:     Head: Normocephalic and atraumatic.  Cardiovascular:     Rate and Rhythm: Normal rate and regular rhythm.     Heart sounds: No murmur heard.    No friction rub. No gallop.  Pulmonary:     Effort: Pulmonary effort is normal. No respiratory distress.     Breath sounds: Normal breath sounds. No wheezing.  Abdominal:     General: Bowel sounds are normal. There is no distension.     Palpations: Abdomen is soft.     Tenderness: There is no abdominal tenderness.  Musculoskeletal:  General: Normal range of motion.     Cervical back: Normal range of motion and neck supple.     Right lower leg: No tenderness. No edema.     Left lower leg: No tenderness. No edema.  Skin:    General: Skin is warm and dry.  Neurological:     General: No focal deficit present.     Mental Status: She is alert and oriented to person, place, and time.     (all labs ordered are listed, but only abnormal results are displayed) Labs Reviewed  BASIC METABOLIC PANEL WITH GFR  CBC  PREGNANCY, URINE  TROPONIN T, HIGH SENSITIVITY  TROPONIN T, HIGH SENSITIVITY    EKG: EKG Interpretation Date/Time:  Friday August 21 2024 23:28:43  EDT Ventricular Rate:  80 PR Interval:  134 QRS Duration:  64 QT Interval:  340 QTC Calculation: 392 R Axis:   59  Text Interpretation: Normal sinus rhythm Normal ECG When compared with ECG of 03-Jun-2024 15:15, No significant change was found Confirmed by Geroldine Berg ([id number]) on 08/21/2024 11:34:39 PM  Radiology: [facility] Chest Port 1 View Result Date: 08/22/2024 EXAM: 1 VIEW(S) XRAY OF THE CHEST 08/22/2024 12:33:00 AM COMPARISON: 06/11/2024 CLINICAL HISTORY: L side chest pain and radiating to back x3 days, denies SOB, hx anxiety FINDINGS: LUNGS AND PLEURA: No focal pulmonary opacity. No pulmonary edema. No pleural effusion. No pneumothorax. HEART AND MEDIASTINUM: No acute abnormality of the cardiac and mediastinal silhouettes. BONES AND SOFT TISSUES: No acute osseous abnormality. IMPRESSION: 1. No acute abnormalities. Electronically signed by: Norman Gatlin MD 08/22/2024 12:59 AM EDT RP Workstation: [id number]     Procedures   Medications Ordered in the ED - No data to display                                  Medical Decision Making Amount and/or Complexity of Data Reviewed Labs: ordered. Radiology: ordered.   Patient presenting with left-sided chest pain as described in the HPI.  She arrives here with stable vital signs and is afebrile.  Physical examination basically unremarkable.  Laboratory studies obtained including CBC, metabolic panel, and troponin, all of which are normal.  Chest x-ray shows no acute abnormality.  Patient presenting with pain behind her left breast that I suspect is likely musculoskeletal or GI related.  Symptoms are atypical for cardiac pain and cardiac workup is unremarkable.  I feel as though patient can safely be discharged.  I will recommend rest, ibuprofen, and return as needed.  She reports a history of prior DVT, but I highly doubt pulmonary embolism given the situation.  Her heart rate is 70 and oxygen  saturations are 100%.  There is no pleuritic  component and presentation is inconsistent with PE.     Final diagnoses:  None    ED Discharge Orders     None          Geroldine Berg, MD 08/22/24 [phone]

## 2024-08-22 NOTE — Discharge Instructions (Signed)
 Take ibuprofen 600 mg every 6 hours as needed for pain.  Follow-up with primary doctor if not improving, and return to the ER if symptoms significantly worsen or change.

## 2024-08-23 ENCOUNTER — Ambulatory Visit: Payer: Self-pay | Admitting: Cardiology

## 2024-08-30 ENCOUNTER — Other Ambulatory Visit: Payer: Self-pay

## 2024-08-30 ENCOUNTER — Emergency Department (HOSPITAL_BASED_OUTPATIENT_CLINIC_OR_DEPARTMENT_OTHER)
Admission: EM | Admit: 2024-08-30 | Discharge: 2024-08-30 | Disposition: A | Discharge status: Home / Self Care | Attending: Emergency Medicine | Admitting: Emergency Medicine

## 2024-08-30 DIAGNOSIS — R1013 Epigastric pain: Secondary | ICD-10-CM | POA: Diagnosis not present

## 2024-08-30 DIAGNOSIS — R109 Unspecified abdominal pain: Secondary | ICD-10-CM | POA: Diagnosis not present

## 2024-08-30 LAB — CBC
HCT: 37.2 % (ref 36.0–46.0)
Hemoglobin: 12.8 g/dL (ref 12.0–15.0)
MCH: 30 pg (ref 26.0–34.0)
MCHC: 34.4 g/dL (ref 30.0–36.0)
MCV: 87.3 fL (ref 80.0–100.0)
Platelets: 218 K/uL (ref 150–400)
RBC: 4.26 MIL/uL (ref 3.87–5.11)
RDW: 11.6 % (ref 11.5–15.5)
WBC: 6.1 K/uL (ref 4.0–10.5)
nRBC: 0 % (ref 0.0–0.2)

## 2024-08-30 LAB — COMPREHENSIVE METABOLIC PANEL WITH GFR
ALT: 10 U/L (ref 0–44)
AST: 15 U/L (ref 15–41)
Albumin: 4.6 g/dL (ref 3.5–5.0)
Alkaline Phosphatase: 54 U/L (ref 38–126)
Anion gap: 10 (ref 5–15)
BUN: 14 mg/dL (ref 6–20)
CO2: 24 mmol/L (ref 22–32)
Calcium: 9.5 mg/dL (ref 8.9–10.3)
Chloride: 104 mmol/L (ref 98–111)
Creatinine, Ser: 0.72 mg/dL (ref 0.44–1.00)
GFR, Estimated: >60 mL/min (ref >60)
Glucose, Bld: 92 mg/dL (ref 70–99)
Potassium: 4 mmol/L (ref 3.5–5.1)
Sodium: 137 mmol/L (ref 135–145)
Total Bilirubin: 0.4 mg/dL (ref 0.0–1.2)
Total Protein: 6.7 g/dL (ref 6.5–8.1)

## 2024-08-30 LAB — LIPASE, BLOOD: Lipase: 38 U/L (ref 11–51)

## 2024-08-30 NOTE — ED Provider Notes (Signed)
 [place] EMERGENCY DEPARTMENT AT [facility] Provider Note   CSN: [id number] Arrival date & time: 08/30/24  2138     Patient presents with: Abdominal Pain   Stacy Wilkins is a 38 y.o. female.    Abdominal Pain Patient presents with multiple complaints.  Abdominal pain chest pain neck pain tingling in the hand.  Recently seen for same.  States worse the last few days.  However reviewing notes it appears been going on for a month or 2 now.  Has seen GI and had a scope.  Has had scope by ENT.  Has seen cardiology.  Has had echocardiogram.  States eating is made the pain better.  Sometimes will feel heart tingle.  Sometimes will have some numbness in the left hand.  Pain seems to be worse lying down.     Prior to Admission medications   Medication Sig Start Date End Date Taking? Authorizing Provider  AMBULATORY NON FORMULARY MEDICATION Nitroglycerine ointment 0.125 %  Apply a pea sized amount internally three to four times daily. Dispense 30 GM one refill 05/09/22   Nandigam, Kavitha V, MD  AMBULATORY NON FORMULARY MEDICATION Medication Name: Diltiazem 2% gel - Using your index finger, apply a small amount of medication inside the rectum up to your first knuckle/joint three times daily x 6-8 weeks. 07/05/23   Beather Delon Gibson, PA  Azelastine HCl 137 MCG/SPRAY SOLN Place 1 spray into the nose as needed. 02/13/24   [provider]  etonogestrel  (NEXPLANON ) 68 MG IMPL implant See admin instructions.    [provider]  fluticasone  (FLONASE ) 50 MCG/ACT nasal spray SPRAY 2 SPRAYS INTO EACH NOSTRIL EVERY DAY 04/18/18   Wiseman, Brittany D, PA-C  Multiple Vitamins-Minerals (THERA-M) TABS Take 1 tablet by mouth daily.     [provider]  ondansetron  (ZOFRAN ) 4 MG tablet Take 1 tablet (4 mg total) by mouth every 12 (twelve) hours as needed for nausea. 01/07/23   Kerman Vina HERO, NP  pantoprazole  (PROTONIX ) 40 MG tablet TAKE 1 TABLET BY MOUTH EVERY DAY  04/30/24   Beather Delon Gibson, PA  propranolol  ER (INDERAL  LA) 60 MG 24 hr capsule Take 1 capsule (60 mg total) by mouth daily. 05/07/24   Tolia, Sunit, DO  valACYclovir (VALTREX) 500 MG tablet Take 1 tablet by mouth as needed.  08/27/19   [provider]    Allergies: Ciprofloxacin, Nitrofurantoin , Esomeprazole  magnesium , Bacitracin, Mustard, Neo-bacit-poly-lidocaine , Nickel, Onion, and Other    Review of Systems  Gastrointestinal:  Positive for abdominal pain.    Updated Vital Signs BP 118/89   Pulse 81   Temp 98.6 F (37 C) (Temporal)   Resp 19   Ht 5' 5 (1.651 m)   Wt 79.4 kg   SpO2 100%   BMI 29.12 kg/m   Physical Exam Vitals and nursing note reviewed.  Cardiovascular:     Rate and Rhythm: Normal rate and regular rhythm.  Abdominal:     Tenderness: There is no abdominal tenderness.     Hernia: No hernia is present.  Skin:    General: Skin is warm.     Capillary Refill: Capillary refill takes less than 2 seconds.  Neurological:     Mental Status: She is alert.     (all labs ordered are listed, but only abnormal results are displayed) Labs Reviewed  LIPASE, BLOOD  COMPREHENSIVE METABOLIC PANEL WITH GFR  CBC  URINALYSIS, ROUTINE W REFLEX MICROSCOPIC  PREGNANCY, URINE    EKG: None  Radiology:  No results found.   Procedures   Medications Ordered in the ED - No data to display                                  Medical Decision Making Amount and/or Complexity of Data Reviewed Labs: ordered.   Patient also complains.  Abdominal pain chest pain neck pain arm numbness at times.  Has been extensively evaluated for this as an outpatient.  However workup today reassuring.  Do not see that LFTs have been done and they are normal.  Reviewed previous CT scan and did not have biliary disease at that time.  Also reviewed cardiology echocardiogram.  Reviewed cardiology monitoring note.  Reviewed ENT note.  Reviewed GI scope note.  Appears stable for  discharge home.     Final diagnoses:  Epigastric pain    ED Discharge Orders     None          Patsey Lot, MD 08/30/24 2303

## 2024-08-30 NOTE — ED Triage Notes (Signed)
 Pt POV reporting upper abd pain and increased burping past few days. Also reporting tingling in hands that began Friday night, hx anxiety.

## 2024-08-30 NOTE — Discharge Instructions (Signed)
 Your workup is reassuring today.  We have also reviewed recent ENT cardiology and GI notes.  Follow-up with your doctor as needed.

## 2024-09-01 DIAGNOSIS — K5900 Constipation, unspecified: Secondary | ICD-10-CM | POA: Diagnosis not present

## 2024-09-01 DIAGNOSIS — K209 Esophagitis, unspecified without bleeding: Secondary | ICD-10-CM | POA: Diagnosis not present

## 2024-09-01 DIAGNOSIS — R1013 Epigastric pain: Secondary | ICD-10-CM | POA: Diagnosis not present

## 2024-09-01 DIAGNOSIS — R110 Nausea: Secondary | ICD-10-CM | POA: Diagnosis not present

## 2024-09-09 ENCOUNTER — Encounter: Payer: Self-pay | Admitting: Cardiology

## 2024-09-09 DIAGNOSIS — J028 Acute pharyngitis due to other specified organisms: Secondary | ICD-10-CM | POA: Diagnosis not present

## 2024-09-09 DIAGNOSIS — R1013 Epigastric pain: Secondary | ICD-10-CM | POA: Diagnosis not present

## 2024-09-09 DIAGNOSIS — R1012 Left upper quadrant pain: Secondary | ICD-10-CM | POA: Diagnosis not present

## 2024-09-09 DIAGNOSIS — H6503 Acute serous otitis media, bilateral: Secondary | ICD-10-CM | POA: Diagnosis not present

## 2024-09-09 DIAGNOSIS — R110 Nausea: Secondary | ICD-10-CM | POA: Diagnosis not present

## 2024-09-09 DIAGNOSIS — B9789 Other viral agents as the cause of diseases classified elsewhere: Secondary | ICD-10-CM | POA: Diagnosis not present

## 2024-09-10 DIAGNOSIS — J029 Acute pharyngitis, unspecified: Secondary | ICD-10-CM | POA: Diagnosis not present

## 2024-09-10 DIAGNOSIS — M546 Pain in thoracic spine: Secondary | ICD-10-CM | POA: Diagnosis not present

## 2024-09-10 DIAGNOSIS — R1013 Epigastric pain: Secondary | ICD-10-CM | POA: Diagnosis not present

## 2024-09-14 NOTE — Telephone Encounter (Signed)
 I reviewed the CT report from [facility]. The CT was for the abdomen and did not evaluate the thoracic cavity/heart. Based on the report the abdominal aorta notes minimal calcified plaque.  Would recommend focusing on improving her lipids i.e. cholesterol.  I am happy to discuss this at an office visit and even consider additional testing - based on goals of care.   Stacy Wilkins [place], DO, FACC

## 2024-09-15 DIAGNOSIS — M546 Pain in thoracic spine: Secondary | ICD-10-CM | POA: Diagnosis not present

## 2024-09-19 DIAGNOSIS — B9689 Other specified bacterial agents as the cause of diseases classified elsewhere: Secondary | ICD-10-CM | POA: Diagnosis not present

## 2024-09-19 DIAGNOSIS — J019 Acute sinusitis, unspecified: Secondary | ICD-10-CM | POA: Diagnosis not present

## 2024-09-22 ENCOUNTER — Ambulatory Visit: Admitting: Diagnostic Neuroimaging

## 2024-09-30 DIAGNOSIS — R070 Pain in throat: Secondary | ICD-10-CM | POA: Diagnosis not present

## 2024-09-30 DIAGNOSIS — R432 Parageusia: Secondary | ICD-10-CM | POA: Diagnosis not present

## 2024-09-30 DIAGNOSIS — R09A2 Foreign body sensation, throat: Secondary | ICD-10-CM | POA: Diagnosis not present

## 2024-09-30 DIAGNOSIS — R1013 Epigastric pain: Secondary | ICD-10-CM | POA: Diagnosis not present

## 2024-09-30 DIAGNOSIS — R1314 Dysphagia, pharyngoesophageal phase: Secondary | ICD-10-CM | POA: Diagnosis not present

## 2024-10-06 ENCOUNTER — Other Ambulatory Visit: Payer: Self-pay | Admitting: Physician Assistant

## 2024-10-20 ENCOUNTER — Ambulatory Visit: Admitting: Diagnostic Neuroimaging

## 2024-10-27 ENCOUNTER — Encounter: Payer: Self-pay | Admitting: Cardiology

## 2024-10-27 ENCOUNTER — Ambulatory Visit: Attending: Cardiology | Admitting: Cardiology

## 2024-11-04 DIAGNOSIS — M7712 Lateral epicondylitis, left elbow: Secondary | ICD-10-CM | POA: Diagnosis not present

## 2024-11-04 DIAGNOSIS — K146 Glossodynia: Secondary | ICD-10-CM | POA: Diagnosis not present

## 2024-11-11 DIAGNOSIS — K146 Glossodynia: Secondary | ICD-10-CM | POA: Diagnosis not present

## 2024-11-11 DIAGNOSIS — R590 Localized enlarged lymph nodes: Secondary | ICD-10-CM | POA: Diagnosis not present

## 2024-11-12 DIAGNOSIS — R590 Localized enlarged lymph nodes: Secondary | ICD-10-CM | POA: Diagnosis not present

## 2024-11-12 DIAGNOSIS — R070 Pain in throat: Secondary | ICD-10-CM | POA: Diagnosis not present

## 2024-11-12 DIAGNOSIS — R3989 Other symptoms and signs involving the genitourinary system: Secondary | ICD-10-CM | POA: Diagnosis not present

## 2024-11-18 ENCOUNTER — Ambulatory Visit: Admitting: Diagnostic Neuroimaging

## 2025-02-03 ENCOUNTER — Ambulatory Visit: Admitting: Diagnostic Neuroimaging
